# Patient Record
Sex: Female | Born: 1935 | Race: White | Hispanic: No | Marital: Single | Smoking: Never smoker
Health system: Southern US, Community
[De-identification: ages and names within clinical notes are randomized; demographics above are authoritative.]

## PROBLEM LIST (undated history)

## (undated) DIAGNOSIS — N289 Disorder of kidney and ureter, unspecified: Secondary | ICD-10-CM

## (undated) DIAGNOSIS — I10 Essential (primary) hypertension: Secondary | ICD-10-CM

## (undated) DIAGNOSIS — D696 Thrombocytopenia, unspecified: Secondary | ICD-10-CM

## (undated) DIAGNOSIS — K219 Gastro-esophageal reflux disease without esophagitis: Secondary | ICD-10-CM

## (undated) DIAGNOSIS — I4891 Unspecified atrial fibrillation: Secondary | ICD-10-CM

## (undated) DIAGNOSIS — I639 Cerebral infarction, unspecified: Secondary | ICD-10-CM

## (undated) HISTORY — PX: JOINT REPLACEMENT: SHX530

## (undated) HISTORY — PX: ABDOMINAL HYSTERECTOMY: SHX81

## (undated) HISTORY — DX: Unspecified atrial fibrillation: I48.91

---

## 2004-09-07 ENCOUNTER — Ambulatory Visit (HOSPITAL_COMMUNITY): Admission: RE | Admit: 2004-09-07 | Discharge: 2004-09-07 | Payer: Self-pay | Admitting: Family Medicine

## 2005-09-18 ENCOUNTER — Ambulatory Visit (HOSPITAL_COMMUNITY): Admission: RE | Admit: 2005-09-18 | Discharge: 2005-09-18 | Payer: Self-pay | Admitting: Family Medicine

## 2006-10-08 ENCOUNTER — Encounter: Admission: RE | Admit: 2006-10-08 | Discharge: 2006-10-08 | Payer: Self-pay | Admitting: Family Medicine

## 2007-06-09 ENCOUNTER — Ambulatory Visit: Payer: Self-pay | Admitting: Cardiology

## 2007-10-28 ENCOUNTER — Ambulatory Visit (HOSPITAL_COMMUNITY): Admission: RE | Admit: 2007-10-28 | Discharge: 2007-10-28 | Payer: Self-pay | Admitting: Family Medicine

## 2008-11-23 ENCOUNTER — Ambulatory Visit (HOSPITAL_COMMUNITY): Admission: RE | Admit: 2008-11-23 | Discharge: 2008-11-23 | Payer: Self-pay | Admitting: Family Medicine

## 2010-04-03 ENCOUNTER — Ambulatory Visit (HOSPITAL_COMMUNITY): Admission: RE | Admit: 2010-04-03 | Discharge: 2010-04-03 | Payer: Self-pay | Admitting: Family Medicine

## 2011-05-14 ENCOUNTER — Other Ambulatory Visit (HOSPITAL_COMMUNITY): Payer: Self-pay | Admitting: Family Medicine

## 2011-05-14 DIAGNOSIS — Z139 Encounter for screening, unspecified: Secondary | ICD-10-CM

## 2011-05-17 ENCOUNTER — Ambulatory Visit (HOSPITAL_COMMUNITY)
Admission: RE | Admit: 2011-05-17 | Discharge: 2011-05-17 | Disposition: A | Discharge status: Home / Self Care | Payer: Medicare Other | Source: Ambulatory Visit | Attending: Family Medicine | Admitting: Family Medicine

## 2011-05-17 DIAGNOSIS — Z1231 Encounter for screening mammogram for malignant neoplasm of breast: Secondary | ICD-10-CM | POA: Insufficient documentation

## 2011-05-17 DIAGNOSIS — Z139 Encounter for screening, unspecified: Secondary | ICD-10-CM

## 2012-08-28 ENCOUNTER — Other Ambulatory Visit (HOSPITAL_COMMUNITY): Payer: Self-pay | Admitting: Family Medicine

## 2012-08-28 DIAGNOSIS — Z139 Encounter for screening, unspecified: Secondary | ICD-10-CM

## 2012-09-02 ENCOUNTER — Ambulatory Visit (HOSPITAL_COMMUNITY)
Admission: RE | Admit: 2012-09-02 | Discharge: 2012-09-02 | Disposition: A | Discharge status: Home / Self Care | Payer: Medicare Other | Source: Ambulatory Visit | Attending: Family Medicine | Admitting: Family Medicine

## 2012-09-02 DIAGNOSIS — Z1231 Encounter for screening mammogram for malignant neoplasm of breast: Secondary | ICD-10-CM | POA: Insufficient documentation

## 2012-09-02 DIAGNOSIS — Z139 Encounter for screening, unspecified: Secondary | ICD-10-CM

## 2013-04-20 ENCOUNTER — Inpatient Hospital Stay (HOSPITAL_COMMUNITY): Payer: Medicare Other

## 2013-04-20 ENCOUNTER — Encounter (HOSPITAL_COMMUNITY): Payer: Self-pay

## 2013-04-20 ENCOUNTER — Inpatient Hospital Stay (HOSPITAL_COMMUNITY)
Admission: EM | Admit: 2013-04-20 | Discharge: 2013-04-22 | DRG: 064 | Disposition: A | Discharge status: Skilled Nursing Facility | Payer: Medicare Other | Attending: Internal Medicine | Admitting: Internal Medicine

## 2013-04-20 ENCOUNTER — Emergency Department (HOSPITAL_COMMUNITY): Payer: Medicare Other

## 2013-04-20 ENCOUNTER — Observation Stay (HOSPITAL_COMMUNITY): Payer: Medicare Other

## 2013-04-20 DIAGNOSIS — E871 Hypo-osmolality and hyponatremia: Secondary | ICD-10-CM | POA: Diagnosis present

## 2013-04-20 DIAGNOSIS — Z66 Do not resuscitate: Secondary | ICD-10-CM | POA: Diagnosis present

## 2013-04-20 DIAGNOSIS — I639 Cerebral infarction, unspecified: Secondary | ICD-10-CM

## 2013-04-20 DIAGNOSIS — R7309 Other abnormal glucose: Secondary | ICD-10-CM | POA: Diagnosis present

## 2013-04-20 DIAGNOSIS — I480 Paroxysmal atrial fibrillation: Secondary | ICD-10-CM

## 2013-04-20 DIAGNOSIS — R4701 Aphasia: Secondary | ICD-10-CM | POA: Diagnosis present

## 2013-04-20 DIAGNOSIS — Z79899 Other long term (current) drug therapy: Secondary | ICD-10-CM

## 2013-04-20 DIAGNOSIS — J9600 Acute respiratory failure, unspecified whether with hypoxia or hypercapnia: Secondary | ICD-10-CM | POA: Diagnosis not present

## 2013-04-20 DIAGNOSIS — Z7901 Long term (current) use of anticoagulants: Secondary | ICD-10-CM

## 2013-04-20 DIAGNOSIS — J9601 Acute respiratory failure with hypoxia: Secondary | ICD-10-CM | POA: Diagnosis present

## 2013-04-20 DIAGNOSIS — I10 Essential (primary) hypertension: Secondary | ICD-10-CM | POA: Diagnosis present

## 2013-04-20 DIAGNOSIS — I6340 Cerebral infarction due to embolism of unspecified cerebral artery: Principal | ICD-10-CM | POA: Diagnosis present

## 2013-04-20 DIAGNOSIS — G9349 Other encephalopathy: Secondary | ICD-10-CM | POA: Diagnosis present

## 2013-04-20 DIAGNOSIS — I4819 Other persistent atrial fibrillation: Secondary | ICD-10-CM | POA: Diagnosis not present

## 2013-04-20 DIAGNOSIS — R739 Hyperglycemia, unspecified: Secondary | ICD-10-CM | POA: Diagnosis present

## 2013-04-20 DIAGNOSIS — Z602 Problems related to living alone: Secondary | ICD-10-CM

## 2013-04-20 DIAGNOSIS — I4891 Unspecified atrial fibrillation: Secondary | ICD-10-CM

## 2013-04-20 DIAGNOSIS — K219 Gastro-esophageal reflux disease without esophagitis: Secondary | ICD-10-CM | POA: Diagnosis present

## 2013-04-20 DIAGNOSIS — R0902 Hypoxemia: Secondary | ICD-10-CM | POA: Diagnosis present

## 2013-04-20 DIAGNOSIS — R4781 Slurred speech: Secondary | ICD-10-CM | POA: Diagnosis present

## 2013-04-20 HISTORY — DX: Gastro-esophageal reflux disease without esophagitis: K21.9

## 2013-04-20 HISTORY — DX: Essential (primary) hypertension: I10

## 2013-04-20 LAB — GLUCOSE, CAPILLARY
Glucose-Capillary: 105 mg/dL — ABNORMAL HIGH (ref 70–99)
Glucose-Capillary: 145 mg/dL — ABNORMAL HIGH (ref 70–99)

## 2013-04-20 LAB — CBC
HCT: 38.7 % (ref 36.0–46.0)
Hemoglobin: 13.3 g/dL (ref 12.0–15.0)
MCH: 31 pg (ref 26.0–34.0)
MCHC: 34.4 g/dL (ref 30.0–36.0)
MCV: 90.2 fL (ref 78.0–100.0)
Platelets: 211 10*3/uL (ref 150–400)
RBC: 4.29 MIL/uL (ref 3.87–5.11)
RDW: 13.4 % (ref 11.5–15.5)
WBC: 9.1 10*3/uL (ref 4.0–10.5)

## 2013-04-20 LAB — BASIC METABOLIC PANEL
BUN: 19 mg/dL (ref 6–23)
CO2: 28 mEq/L (ref 19–32)
Calcium: 9.9 mg/dL (ref 8.4–10.5)
Chloride: 94 mEq/L — ABNORMAL LOW (ref 96–112)
Creatinine, Ser: 0.71 mg/dL (ref 0.50–1.10)
GFR calc Af Amer: 86 mL/min — ABNORMAL LOW (ref >90)
GFR calc non Af Amer: 74 mL/min — ABNORMAL LOW (ref >90)
Glucose, Bld: 143 mg/dL — ABNORMAL HIGH (ref 70–99)
Potassium: 4.1 mEq/L (ref 3.5–5.1)
Sodium: 131 mEq/L — ABNORMAL LOW (ref 135–145)

## 2013-04-20 LAB — RAPID URINE DRUG SCREEN, HOSP PERFORMED
Opiates: NOT DETECTED
Tetrahydrocannabinol: NOT DETECTED

## 2013-04-20 LAB — LIPID PANEL
HDL: 110 mg/dL (ref >39)
Total CHOL/HDL Ratio: 2.1 RATIO
VLDL: 8 mg/dL (ref 0–40)

## 2013-04-20 LAB — PROTIME-INR
INR: 0.89 (ref 0.00–1.49)
Prothrombin Time: 12 seconds (ref 11.6–15.2)

## 2013-04-20 LAB — HEMOGLOBIN A1C: Mean Plasma Glucose: 105 mg/dL (ref <117)

## 2013-04-20 LAB — APTT: aPTT: 30 seconds (ref 24–37)

## 2013-04-20 LAB — TROPONIN I: Troponin I: <0.3 ng/mL (ref <0.30)

## 2013-04-20 MED ORDER — CLOPIDOGREL BISULFATE 75 MG PO TABS
75.0000 mg | ORAL_TABLET | Freq: Every day | ORAL | Status: DC
  Administered 2013-04-20 – 2013-04-21 (×2): 75 mg via ORAL
  Filled 2013-04-20 (×2): qty 1

## 2013-04-20 MED ORDER — ONDANSETRON HCL 4 MG/2ML IJ SOLN: 4.0000 mg | Freq: Three times a day (TID) | INTRAMUSCULAR | Status: AC | PRN

## 2013-04-20 MED ORDER — PANTOPRAZOLE SODIUM 40 MG PO TBEC
40.0000 mg | DELAYED_RELEASE_TABLET | Freq: Every day | ORAL | Status: DC
  Administered 2013-04-20 – 2013-04-22 (×3): 40 mg via ORAL
  Filled 2013-04-20 (×3): qty 1

## 2013-04-20 MED ORDER — LORATADINE 10 MG PO TABS
10.0000 mg | ORAL_TABLET | Freq: Every day | ORAL | Status: DC
  Administered 2013-04-20 – 2013-04-22 (×3): 10 mg via ORAL
  Filled 2013-04-20 (×3): qty 1

## 2013-04-20 MED ORDER — ACETAMINOPHEN 500 MG PO TABS
500.0000 mg | ORAL_TABLET | Freq: Four times a day (QID) | ORAL | Status: DC | PRN
  Administered 2013-04-20: 500 mg via ORAL
  Filled 2013-04-20 (×2): qty 1

## 2013-04-20 MED ORDER — SIMVASTATIN 20 MG PO TABS
20.0000 mg | ORAL_TABLET | Freq: Every day | ORAL | Status: DC
  Administered 2013-04-20 – 2013-04-21 (×2): 20 mg via ORAL
  Filled 2013-04-20 (×4): qty 1

## 2013-04-20 MED ORDER — METOPROLOL TARTRATE 50 MG PO TABS
50.0000 mg | ORAL_TABLET | Freq: Two times a day (BID) | ORAL | Status: DC
  Administered 2013-04-20 – 2013-04-22 (×5): 50 mg via ORAL
  Filled 2013-04-20 (×6): qty 1

## 2013-04-20 MED ORDER — ENOXAPARIN SODIUM 30 MG/0.3ML ~~LOC~~ SOLN
30.0000 mg | SUBCUTANEOUS | Status: DC
  Administered 2013-04-20 – 2013-04-21 (×2): 30 mg via SUBCUTANEOUS
  Filled 2013-04-20 (×2): qty 0.3

## 2013-04-20 MED ORDER — SODIUM CHLORIDE 0.9 % IV SOLN
INTRAVENOUS | Status: DC
  Administered 2013-04-20: 02:00:00 via INTRAVENOUS

## 2013-04-20 MED ORDER — ADULT MULTIVITAMIN W/MINERALS CH
1.0000 | ORAL_TABLET | Freq: Every day | ORAL | Status: DC
  Administered 2013-04-20 – 2013-04-22 (×3): 1 via ORAL
  Filled 2013-04-20 (×3): qty 1

## 2013-04-20 MED ORDER — SODIUM CHLORIDE 0.9 % IV SOLN
INTRAVENOUS | Status: DC
  Administered 2013-04-20 (×2): via INTRAVENOUS

## 2013-04-20 NOTE — Evaluation (Signed)
I have read and agree with the below assessment and plan.   Swayzie Choate Helen Whitlow PT, DPT Pager: [phone] 

## 2013-04-20 NOTE — Consult Note (Signed)
Admission H&P    Chief Complaint: New-onset speech difficulty and confusion.  HPI: Theresa Kennedy is an 79 y.o. female with a history of hypertension who was found to be confused and having speech difficulty at 2100 on 04/19/2013. It is unknown at this point when the patient was last seen well. There is no documented history of previous stroke. Patient has been taking aspirin 81 mg daily. CT scan of her head showed no acute intracranial abnormality. Patient was beyond time window for treatment consideration with TPA, even considering when she was found by neighbors at 2100.   LSN: Unknown tPA Given: No: Beyond time under for treatment consideration mRankin:  No past medical history on file.  No past surgical history on file.  No family history on file. Social History:  has no tobacco, alcohol, and drug history on file.  Allergies: Allergies not on file   (Not in a hospital admission)  ROS: Unavailable.  Physical Examination: Blood pressure 141/60, pulse 67, temperature 97.7 F (36.5 C), resp. rate 14, SpO2 96.00%.  Neurologic Examination: Mental Status: Alert, oriented to age and month.  No difficulty with naming simple objects, intermittent word salad type expressions with more complex output. She also tended to perseverate with verbal responses Able to follow commands. Cranial Nerves: II-Visual fields were normal. III/IV/VI-Pupils were equal and reacted. Extraocular movements were full and conjugate.    V/VII-no facial weakness. VIII-normal. X-no dysarthria. Motor: 5/5 bilaterally with normal tone and bulk Sensory: Normal throughout. Deep Tendon Reflexes: 1+ and symmetric. Plantars: Mute on the right and flexor on the left. Cerebellar: Normal finger-to-nose testing. Carotid auscultation: Normal  Results for orders placed during the hospital encounter of 04/20/13 (from the past 48 hour(s))  CBC     Status: None   Collection Time    04/20/13 12:19 AM      Result Value  Range   WBC 9.1  4.0 - 10.5 K/uL   RBC 4.29  3.87 - 5.11 MIL/uL   Hemoglobin 13.3  12.0 - 15.0 g/dL   HCT 60.4  54.0 - 98.1 %   MCV 90.2  78.0 - 100.0 fL   MCH 31.0  26.0 - 34.0 pg   MCHC 34.4  30.0 - 36.0 g/dL   RDW 19.1  47.8 - 29.5 %   Platelets 211  150 - 400 K/uL  BASIC METABOLIC PANEL     Status: Abnormal   Collection Time    04/20/13 12:19 AM      Result Value Range   Sodium 131 (*) 135 - 145 mEq/L   Potassium 4.1  3.5 - 5.1 mEq/L   Chloride 94 (*) 96 - 112 mEq/L   CO2 28  19 - 32 mEq/L   Glucose, Bld 143 (*) 70 - 99 mg/dL   BUN 19  6 - 23 mg/dL   Creatinine, Ser 6.21  0.50 - 1.10 mg/dL   Calcium 9.9  8.4 - 30.8 mg/dL   GFR calc non Af Amer 74 (*) >90 mL/min   GFR calc Af Amer 86 (*) >90 mL/min   Comment:            The eGFR has been calculated     using the CKD EPI equation.     This calculation has not been     validated in all clinical     situations.     eGFR's persistently     <90 mL/min signify     possible Chronic Kidney Disease.  PROTIME-INR  Status: None   Collection Time    04/20/13 12:19 AM      Result Value Range   Prothrombin Time 12.0  11.6 - 15.2 seconds   INR 0.89  0.00 - 1.49  TROPONIN I     Status: None   Collection Time    04/20/13 12:19 AM      Result Value Range   Troponin I <0.30  <0.30 ng/mL   Comment:            Due to the release kinetics of cTnI,     a negative result within the first hours     of the onset of symptoms does not rule out     myocardial infarction with certainty.     If myocardial infarction is still suspected,     repeat the test at appropriate intervals.  APTT     Status: None   Collection Time    04/20/13 12:19 AM      Result Value Range   aPTT 30  24 - 37 seconds  GLUCOSE, CAPILLARY     Status: Abnormal   Collection Time    04/20/13 12:34 AM      Result Value Range   Glucose-Capillary 145 (*) 70 - 99 mg/dL   Ct Head Wo Contrast  04/20/2013   *RADIOLOGY REPORT*  Clinical Data: Code stroke.  CT HEAD  WITHOUT CONTRAST  Technique:  Contiguous axial images were obtained from the base of the skull through the vertex without contrast.  Comparison: None.  Findings: There is no evidence of acute infarction, mass lesion, or intra- or extra-axial hemorrhage on CT.  Prominence of the ventricles and sulci reflects mild cortical volume loss.  Minimal periventricular white matter change likely reflects small vessel ischemic microangiopathy.  The brainstem and fourth ventricle are within normal limits.  The basal ganglia are unremarkable in appearance.  The cerebral hemispheres demonstrate grossly normal gray-white differentiation. No mass effect or midline shift is seen.  There is no evidence of fracture; visualized osseous structures are unremarkable in appearance.  The orbits are within normal limits. The paranasal sinuses and mastoid air cells are well-aerated.  No significant soft tissue abnormalities are seen.  IMPRESSION:  1.  No acute intracranial pathology seen on CT. 2.  Mild cortical volume loss and minimal small vessel ischemic microangiopathy.  These results were called by telephone on 04/20/2013 at 12:18 a.m. to Dr. Marisa Severin, who verbally acknowledged these results.   Original Report Authenticated By: Tonia Ghent, M.D.    Assessment: 79 y.o. female of hypertension presenting with probable acute left MCA territory cerebral infarction with expressive aphasia.  Stroke Risk Factors - hypertension  Plan: 1. HgbA1c, fasting lipid panel 2. MRI, MRA  of the brain without contrast 3. PT consult, OT consult, Speech consult 4. Echocardiogram 5. Carotid dopplers 6. Prophylactic therapy-Antiplatelet med: Plavix  7. Risk factor modification 8. Telemetry monitoring   C.R. Roseanne Reno, MD [facility] [phone] 04/20/2013, 1:31 AM

## 2013-04-20 NOTE — Progress Notes (Signed)
[facility] PROGRESS NOTE  Theresa Kennedy MVH:[id number] DOB: [birth year] DOA: 04/20/2013 PCP: [name], MD  Assessment/Plan: 1. Aphasia: probably left brain stroke. MRI, MRA pending. ECHO, Doppler pending. LDL 111. PT, OT, Speech therapy consulted. EEG ordered. Continue with plavix.  2. Mild Hyponatremia: Continue with IV fluids.  3. Hypertension: Continue with Metoprolol. Hold Norvasc to avoid hypotension.   Code Status: DNR Family Communication: Nephew at bedside.  Disposition Plan: Likely SNF, patient lives by herself. PT, OT , SW consulted.    Consultants:  Neurology  Procedures:       ECHO pending.   Antibiotics:  none  HPI/Subjective: Patient still with aphasia, but able to answer some questions.   Objective: Filed Vitals:   04/20/13 0115 04/20/13 0200 04/20/13 0300 04/20/13 0600  BP: 148/59 173/79 141/49 145/53  Pulse: 76 84 79 77  Temp:   97.8 F (36.6 C) 98 F (36.7 C)  TempSrc:   Oral Oral  Resp: 18 15 16 18   Height:  5\' 1"  (1.549 m)    Weight:  54.885 kg (121 lb)    SpO2: 98% 98% 98% 98%    Intake/Output Summary (Last 24 hours) at 04/20/13 1003 Last data filed at 04/20/13 0819  Gross per 24 hour  Intake    240 ml  Output      0 ml  Net    240 ml   Filed Weights   04/20/13 0200  Weight: 54.885 kg (121 lb)    Exam:   General:  No distress. Aphasia.   Cardiovascular: S 1, S 2 RRR  Respiratory: CTA  Abdomen: Bs present, soft, NT, ND.   Musculoskeletal: no edema.   Data Reviewed: Basic Metabolic Panel:  Recent Labs Lab 04/20/13 0019  NA 131*  K 4.1  CL 94*  CO2 28  GLUCOSE 143*  BUN 19  CREATININE 0.71  CALCIUM 9.9   Liver Function Tests: No results found for this basename: AST, ALT, ALKPHOS, BILITOT, PROT, ALBUMIN,  in the last 168 hours No results found for this basename: LIPASE, AMYLASE,  in the last 168 hours No results found for this basename: AMMONIA,  in the last 168 hours CBC:  Recent Labs Lab  04/20/13 0019  WBC 9.1  HGB 13.3  HCT 38.7  MCV 90.2  PLT 211   Cardiac Enzymes:  Recent Labs Lab 04/20/13 0019  TROPONINI <0.30   BNP (last 3 results) No results found for this basename: PROBNP,  in the last 8760 hours CBG:  Recent Labs Lab 04/20/13 0034 04/20/13 0635  GLUCAP 145* 117*    No results found for this or any previous visit (from the past 240 hour(s)).   Studies: Ct Head Wo Contrast  04/20/2013   *RADIOLOGY REPORT*  Clinical Data: Code stroke.  CT HEAD WITHOUT CONTRAST  Technique:  Contiguous axial images were obtained from the base of the skull through the vertex without contrast.  Comparison: None.  Findings: There is no evidence of acute infarction, mass lesion, or intra- or extra-axial hemorrhage on CT.  Prominence of the ventricles and sulci reflects mild cortical volume loss.  Minimal periventricular white matter change likely reflects small vessel ischemic microangiopathy.  The brainstem and fourth ventricle are within normal limits.  The basal ganglia are unremarkable in appearance.  The cerebral hemispheres demonstrate grossly normal gray-white differentiation. No mass effect or midline shift is seen.  There is no evidence of fracture; visualized osseous structures are unremarkable in appearance.  The orbits are within normal  limits. The paranasal sinuses and mastoid air cells are well-aerated.  No significant soft tissue abnormalities are seen.  IMPRESSION:  1.  No acute intracranial pathology seen on CT. 2.  Mild cortical volume loss and minimal small vessel ischemic microangiopathy.  These results were called by telephone on 04/20/2013 at 12:18 a.m. to Dr. Marisa Severin, who verbally acknowledged these results.   Original Report Authenticated By: Tonia Ghent, M.D.    Scheduled Meds: . clopidogrel  75 mg Oral Daily  . enoxaparin (LOVENOX) injection  30 mg Subcutaneous Q24H  . loratadine  10 mg Oral Daily  . metoprolol  50 mg Oral BID  . multivitamin with  minerals  1 tablet Oral Daily  . pantoprazole  40 mg Oral Daily   Continuous Infusions: . sodium chloride 75 mL/hr at 04/20/13 0245    Active Problems:   Aphasia   Hyperglycemia   HTN (hypertension)    Time spent: 25 minutes.     Ryin Schillo  [facility] Pager [phone]. If 7PM-7AM, please contact night-coverage at [email or web address], password [facility] 04/20/2013, 10:03 AM  LOS: 0 days

## 2013-04-20 NOTE — Progress Notes (Signed)
Stroke Team Progress Note  HISTORY Theresa Kennedy is an 79 y.o. female with a history of hypertension who was found to be confused and having speech difficulty at 2100 on 04/19/2013. It is unknown at this point when the patient was last seen well. There is no documented history of previous stroke. Patient has been taking aspirin 81 mg daily. CT scan of her head showed no acute intracranial abnormality. Patient was beyond time window for treatment consideration with TPA, even considering when she was found by neighbors at 2100.  Marland Kitchen She was admitted for further evaluation and treatment.  SUBJECTIVE Her friend is at the bedside.  Overall she feels her condition is stable. She is reading the menu though still with expressing difficulty. She lives alone, is independent. She has no children.  OBJECTIVE Most recent Vital Signs: Filed Vitals:   04/20/13 0115 04/20/13 0200 04/20/13 0300 04/20/13 0600  BP: 148/59 173/79 141/49 145/53  Pulse: 76 84 79 77  Temp:   97.8 F (36.6 C) 98 F (36.7 C)  TempSrc:   Oral Oral  Resp: 18 15 16 18   Height:  5\' 1"  (1.549 m)    Weight:  54.885 kg (121 lb)    SpO2: 98% 98% 98% 98%   CBG (last 3)   Recent Labs  04/20/13 0034 04/20/13 0635  GLUCAP 145* 117*    IV Fluid Intake:   . sodium chloride 75 mL/hr at 04/20/13 0245    MEDICATIONS  . clopidogrel  75 mg Oral Daily  . enoxaparin (LOVENOX) injection  30 mg Subcutaneous Q24H  . loratadine  10 mg Oral Daily  . metoprolol  50 mg Oral BID  . multivitamin with minerals  1 tablet Oral Daily  . pantoprazole  40 mg Oral Daily   PRN:  ondansetron (ZOFRAN) IV  Diet:  Cardiac thin liquids Activity:  As tolerated, Bathroom privileges with assistance DVT Prophylaxis:  Lovenox 30 mg sq daily   CLINICALLY SIGNIFICANT STUDIES Basic Metabolic Panel:  Recent Labs Lab 04/20/13 0019  NA 131*  K 4.1  CL 94*  CO2 28  GLUCOSE 143*  BUN 19  CREATININE 0.71  CALCIUM 9.9   Liver Function Tests: No results  found for this basename: AST, ALT, ALKPHOS, BILITOT, PROT, ALBUMIN,  in the last 168 hours CBC:  Recent Labs Lab 04/20/13 0019  WBC 9.1  HGB 13.3  HCT 38.7  MCV 90.2  PLT 211   Coagulation:  Recent Labs Lab 04/20/13 0019  LABPROT 12.0  INR 0.89   Cardiac Enzymes:  Recent Labs Lab 04/20/13 0019  TROPONINI <0.30   Urinalysis: No results found for this basename: COLORURINE, APPERANCEUR, LABSPEC, PHURINE, GLUCOSEU, HGBUR, BILIRUBINUR, KETONESUR, PROTEINUR, UROBILINOGEN, NITRITE, LEUKOCYTESUR,  in the last 168 hours Lipid Panel    Component Value Date/Time   CHOL 229* 04/20/2013 0420   TRIG 40 04/20/2013 0420   HDL 110 04/20/2013 0420   CHOLHDL 2.1 04/20/2013 0420   VLDL 8 04/20/2013 0420   LDLCALC 111* 04/20/2013 0420   HgbA1C  No results found for this basename: HGBA1C    Urine Drug Screen:     Component Value Date/Time   LABOPIA NONE DETECTED 04/20/2013 0410   COCAINSCRNUR NONE DETECTED 04/20/2013 0410   LABBENZ NONE DETECTED 04/20/2013 0410   AMPHETMU NONE DETECTED 04/20/2013 0410   THCU NONE DETECTED 04/20/2013 0410   LABBARB NONE DETECTED 04/20/2013 0410    Alcohol Level: No results found for this basename: ETH,  in the last 168 hours  CT of the brain  04/20/2013   1.  No acute intracranial pathology seen on CT. 2.  Mild cortical volume loss and minimal small vessel ischemic microangiopathy.    MRI of the brain    MRA of the brain    2D Echocardiogram    Carotid Doppler    CXR    EKG  normal sinus rhythm.   Therapy Recommendations   Physical Exam   Frail elderly Caucasian lady currently not in distress.Awake alert. Afebrile. Head is nontraumatic. Neck is supple without bruit. Hearing is normal. Cardiac exam no murmur or gallop. Lungs are clear to auscultation. Distal pulses are well felt. Neurological Exam :  Awake alert oriented x2. Diminished attention, registration and recall. Speech is fluent with only occasional word finding difficulties and slight hesitancy. Able to  name, repeat and comprehend quite well. Extraocular moments are full range without nystagmus. Fundi were not visualized. She blinks to threat bilaterally. Face is symmetric without weakness. Tongue is midline. Motor system exam reveals symmetric upper and lower eczema the strength without drift or focal weakness. Both plantars are downgoing. Sensation appears preserved. Gait was not tested. ASSESSMENT Theresa Kennedy is a 79 y.o. female presenting with expressive aphasia that has improved since admission.  Suspect left brain stroke/TIA. Imaging pending. Infarct felt to be due to unknown source. On aspirin 81 mg orally every day prior to admission. Now on clopidogrel 75 mg orally every day for secondary stroke prevention. Patient with resultant mild expressive aphasia. Work up underway.  Hypertension GERD  Hospital day # 0  TREATMENT/PLAN  Continue clopidogrel 75 mg orally every day for secondary stroke prevention.  Complete stroke workup - f/u MRI, MRA, 2D, carotids, labs  OOB. Therapy evals.  Check EEG (onset unwitnessed, patient cannot remember onset, good memory at baseline)  Annie Main, MSN, RN, ANVP-BC, ANP-BC, GNP-BC [facility] Pager: [phone] 04/20/2013 10:29 AM  I have personally obtained a history, examined the patient, evaluated imaging results, and formulated the assessment and plan of care. I agree with the above. Delia Heady, MD

## 2013-04-20 NOTE — Progress Notes (Signed)
EEG Completed; Results Pending  

## 2013-04-20 NOTE — Evaluation (Signed)
Physical Therapy Evaluation Patient Details Name: Theresa Kennedy MRN: [id number] DOB: [birth year] Today's Date: 04/20/2013 Time: 1420-1450 PT Time Calculation (min): 30 min  PT Assessment / Plan / Recommendation Clinical Impression  79 y/o female admitted for confusion and speech difficulty. MRI showing acute infarcts in right frontal lobe and right occipital lobe. Pt presents to PT with non fluent aphasia and diminished balance. Pt able to perform basic transfers with min guard and ambulation with min guard/min assist. Acute PT to maximize functional indpendence and saftey for d/c to next venue. Recommending SNF at this time based on avaliable support at d/c.    PT Assessment  Patient needs continued PT services    Follow Up Recommendations  SNF;Supervision/Assistance - 24 hour       Barriers to Discharge Decreased caregiver support            Frequency Min 4X/week    Precautions / Restrictions Precautions Precautions: Fall Restrictions Weight Bearing Restrictions: No   Pertinent Vitals/Pain Pt denies pain      Mobility  Transfers Transfers: Sit to Stand;Stand to Sit Sit to Stand: From chair/3-in-1;4: Min guard Stand to Sit: To chair/3-in-1;4: Min guard Details for Transfer Assistance: pt with increased postural sway during standing. No LOB Ambulation/Gait Ambulation/Gait Assistance: 4: Min guard;4: Min assist Ambulation Distance (Feet): 200 Feet Assistive device: None Ambulation/Gait Assistance Details: Pt requiring min assist to maintain balance during ambulaion when performing head turns. Gait Pattern: Step-through pattern;Decreased stride length;Trunk flexed General Gait Details: shoulders shifted left throughout gait. H/o scoliosis Stairs: No Wheelchair Mobility Wheelchair Mobility: No Modified Rankin (Stroke Patients Only) Pre-Morbid Rankin Score: No symptoms Modified Rankin: Moderately severe disability        PT Diagnosis: Difficulty walking;Abnormality of  gait  PT Problem List: Decreased activity tolerance;Decreased balance;Decreased mobility;Decreased safety awareness;Decreased knowledge of use of DME PT Treatment Interventions: DME instruction;Gait training;Stair training;Functional mobility training;Therapeutic activities;Therapeutic exercise;Balance training;Neuromuscular re-education;Patient/family education   PT Goals Acute Rehab PT Goals PT Goal Formulation: With patient Time For Goal Achievement: 04/27/13 Potential to Achieve Goals: Good Pt will go Sit to Stand: with modified independence PT Goal: Sit to Stand - Progress: Goal set today Pt will go Stand to Sit: with modified independence PT Goal: Stand to Sit - Progress: Goal set today Pt will Ambulate: >150 feet;with modified independence PT Goal: Ambulate - Progress: Goal set today Pt will Go Up / Down Stairs: 1-2 stairs;with modified independence PT Goal: Up/Down Stairs - Progress: Goal set today  Visit Information  Last PT Received On: 04/20/13 Assistance Needed: +1     Subjective Data  Subjective: Pt with non fluent aphasia Patient Stated Goal: return home   Prior Functioning  Home Living Lives With: Alone Available Help at Discharge: Family Type of Home: House Home Access: Stairs to enter [employer] of Steps: 2 Entrance Stairs-Rails: Right;Left Home Layout: Two level;Able to live on main level with bedroom/bathroom Bathroom Shower/Tub: Engineer, manufacturing systems: Standard Home Adaptive Equipment: Raised toilet seat with rails Prior Function Level of Independence: Independent Able to Take Stairs?: Yes Driving: Yes Vocation: Retired Musician: Expressive difficulties    Copywriter, advertising Arousal/Alertness: Awake/alert Behavior During Therapy: WFL for tasks assessed/performed Overall Cognitive Status: Difficult to assess (seemed to be [facility] throughout treatment) Difficult to assess due to: Impaired communication (non  fluent aphasia)    Extremity/Trunk Assessment Right Upper Extremity Assessment RUE ROM/Strength/Tone: [facility] for tasks assessed Left Upper Extremity Assessment LUE ROM/Strength/Tone: [facility] for tasks assessed Right Lower Extremity Assessment RLE  ROM/Strength/Tone: [facility] for tasks assessed RLE Coordination: WFL - gross motor Left Lower Extremity Assessment LLE ROM/Strength/Tone: WFL for tasks assessed LLE Coordination: WFL - gross motor Trunk Assessment Trunk Assessment: Kyphotic   Balance Balance Balance Assessed: Yes Static Standing Balance Static Standing - Balance Support: No upper extremity supported Rhomberg - Eyes Opened: 30 High Level Balance High Level Balance Activites: Head turns High Level Balance Comments: pt requiring min assist to prevent LOB when performing head turns bilaterally  End of Session PT - End of Session Equipment Utilized During Treatment: Gait belt Activity Tolerance: Patient tolerated treatment well Patient left: in chair;with call bell/phone within reach;with bed alarm set;with family/visitor present Nurse Communication: Mobility status  GP     Payton Doughty 04/20/2013, 3:12 PM Payton Doughty, PT Student

## 2013-04-20 NOTE — Progress Notes (Signed)
PT Cancellation Note  Patient Details Name: Theresa Kennedy MRN: [id number] DOB: [birth year]   Cancelled Treatment:    Reason Eval/Treat Not Completed: Patient at procedure or test/unavailable. Patient at MRI. Will f/u as time and schedule allow.    [facility] HELEN 04/20/2013, 1:50 PM Pager: [phone]

## 2013-04-20 NOTE — Progress Notes (Signed)
History: 79 yo F with aphasia  Background: There is a posterior dominant rhythm seen intermittently achieving a frequency of 8 Hz. there is mild generalized irregular delta activity, and in addition there appears to be a focal delta maximal at F8, T8.   Photic stimulation: Physiologic driving is not performed  EEG Abnormalities: 1) focal irregular delta at F8, T8 2) generalized irregular delta  Clinical Interpretation: This abnormal EEG is consistent with an area of cerebral dysfunction in the right frontotemporal region superimposed on a mild nonspecific generalized cerebral dysfunction (encephalopathy). There was no seizure or seizure predisposition recorded on this study.   Ritta Slot, MD [facility] [phone]  If 7pm- 7am, please page neurology on call at [phone].

## 2013-04-20 NOTE — H&P (Addendum)
[facility] History and Physical  Reia Viernes XBJ:[id number] DOB: [birth year] DOA: 04/20/2013  Referring physician: EDP PCP: [name], MD    Chief Complaint: Speech disturbance  HPI: Theresa Kennedy is a 79 y.o. female with history of hypertension, GERD spent most of her day today with having her 75 year old sister who is admitted at [facility] today and then had supper at [facility] with her niece and was dropped off at home around 5 PM. Another niece called her around 8pm, she called multiple times finally around 10pm she picked up the phone and kept mumbling "end of late" and later kept saying " end linger". She was then brought to the ER as a code stroke, seen by neurology. Not felt to be a TPA candidate due to delay in presentation and low NIH score. Upon evaluation the emergency room noted to have expressive aphasia, CT head unremarkable   Review of Systems: The patient denies anorexia, fever, weight loss,, vision loss, decreased hearing, hoarseness, chest pain, syncope, dyspnea on exertion, peripheral edema, balance deficits, hemoptysis, abdominal pain, melena, hematochezia, severe indigestion/heartburn, hematuria, incontinence, genital sores, muscle weakness, suspicious skin lesions, transient blindness, difficulty walking, depression, unusual weight change, abnormal bleeding, enlarged lymph nodes, angioedema, and breast masses.    Past medical history Hypertension Allergies GERD  No past surgical history on file.  Social History:  has no tobacco, alcohol, and drug history on file. Lives at home by herself, independent in most ADLs, cognitively sharp very busy in the community  Allergies not on file  family history  Unable to assess due to speech disturbance  Prior to Admission medications   Medication Sig Start Date End Date Taking? Authorizing Provider  amLODipine (NORVASC) 10 MG tablet Take 10 mg by mouth daily.   Yes Historical Provider, MD   aspirin EC 81 MG tablet Take 81 mg by mouth daily.   Yes Historical Provider, MD  CALCIUM-VITAMIN D PO Take 1 tablet by mouth.   Yes Historical Provider, MD  Grape Seed Extract 100 MG CAPS Take by mouth.   Yes Historical Provider, MD  loratadine (CLARITIN) 10 MG tablet Take 10 mg by mouth.   Yes Historical Provider, MD  metoprolol (LOPRESSOR) 50 MG tablet Take 50 mg by mouth 2 (two) times daily.   Yes Historical Provider, MD  Multiple Vitamin (MULTIVITAMIN WITH MINERALS) TABS Take 1 tablet by mouth daily.   Yes Historical Provider, MD  omeprazole (PRILOSEC) 20 MG capsule Take 20 mg by mouth daily.   Yes Historical Provider, MD  potassium chloride (K-DUR) 10 MEQ tablet Take 10 mEq by mouth 2 (two) times daily.   Yes Historical Provider, MD   Physical Exam: Filed Vitals:   04/20/13 0021 04/20/13 0045  BP: 164/67 141/60  Pulse: 75 67  Temp: 97.7 F (36.5 C)   Resp: 18 14  SpO2: 98% 96%     General:  Is alert awake oriented x3, still with word finding difficulty in expressing herself appropriately  HEENT: PERRLA, EOMI  CVS S1-S2 regular rate rhythm no murmurs rubs or gallops  Lungs clear to auscultation bilaterally  Abdomen soft nontender with normal bowel sounds no organomegaly  Extremities no edema clubbing or cyanosis  Skin no rashes, warm  Psychiatric appropriate mood and affect  Neuro : Expressive Aphasia       cranial nerve 2-12 intact  Verified by 5 in all extremities  Sensory light touch intact  Coordination no dysdiadochokinesia  Reflexes 2+ bilaterally  Plantars withdrawal  Labs on Admission:  Basic Metabolic Panel:  Recent Labs Lab 04/20/13 0019  NA 131*  K 4.1  CL 94*  CO2 28  GLUCOSE 143*  BUN 19  CREATININE 0.71  CALCIUM 9.9   Liver Function Tests: No results found for this basename: AST, ALT, ALKPHOS, BILITOT, PROT, ALBUMIN,  in the last 168 hours No results found for this basename: LIPASE, AMYLASE,  in the last 168 hours No results  found for this basename: AMMONIA,  in the last 168 hours CBC:  Recent Labs Lab 04/20/13 0019  WBC 9.1  HGB 13.3  HCT 38.7  MCV 90.2  PLT 211   Cardiac Enzymes:  Recent Labs Lab 04/20/13 0019  TROPONINI <0.30    BNP (last 3 results) No results found for this basename: PROBNP,  in the last 8760 hours CBG:  Recent Labs Lab 04/20/13 0034  GLUCAP 145*    Radiological Exams on Admission: Ct Head Wo Contrast  04/20/2013   *RADIOLOGY REPORT*  Clinical Data: Code stroke.  CT HEAD WITHOUT CONTRAST  Technique:  Contiguous axial images were obtained from the base of the skull through the vertex without contrast.  Comparison: None.  Findings: There is no evidence of acute infarction, mass lesion, or intra- or extra-axial hemorrhage on CT.  Prominence of the ventricles and sulci reflects mild cortical volume loss.  Minimal periventricular white matter change likely reflects small vessel ischemic microangiopathy.  The brainstem and fourth ventricle are within normal limits.  The basal ganglia are unremarkable in appearance.  The cerebral hemispheres demonstrate grossly normal gray-white differentiation. No mass effect or midline shift is seen.  There is no evidence of fracture; visualized osseous structures are unremarkable in appearance.  The orbits are within normal limits. The paranasal sinuses and mastoid air cells are well-aerated.  No significant soft tissue abnormalities are seen.  IMPRESSION:  1.  No acute intracranial pathology seen on CT. 2.  Mild cortical volume loss and minimal small vessel ischemic microangiopathy.  These results were called by telephone on 04/20/2013 at 12:18 a.m. to Dr. Marisa Severin, who verbally acknowledged these results.   Original Report Authenticated By: Tonia Ghent, M.D.    EKG: Independently reviewed. No acute ST-T wave changes  Assessment/Plan  1. Expressive Aphasia -Admit to Tele -CHeck MRI/MRA -echo /carotid duplex -Lipids, Hbaic -PT/OT/ST  evals -Neuro consulted -start Plavix  2. HTN: -hold amlodipine -Resume metoprolol   3. Hyperglycemia -Check hemoglobin A1c  DVt proph: lovenox  Code Status: DO NOT RESUSCITATE  Family Communication: Discuss with patient and niece at bedside  Disposition Plan: Inpatient    Time spent:  [facility] Pager [phone] If 7PM-7AM, please contact night-coverage [email or web address] Password [facility] 04/20/2013, 1:43 AM

## 2013-04-20 NOTE — Progress Notes (Signed)
SLP Cancellation Note  Patient Details Name: Theresa Kennedy MRN: [id number] DOB: 1921-03-13   Cancelled treatment:       Reason Eval/Treat Not Completed: Patient at procedure or test/unavailable. Will return next date for speech evaluation.   Blenda Mounts Laurice 04/20/2013, 1:13 PM

## 2013-04-20 NOTE — ED Provider Notes (Signed)
History     CSN: [id number]  Arrival date & time 04/20/13  0002   First MD Initiated Contact with Patient 04/20/13 0018      Chief Complaint  Patient presents with  . Code Stroke   HPI Catalena Buford is a 79 y.o. female with a history of hypertension presents with expressive aphasia.  Patient was last seen normal in the afternoon. Patient typically calls her niece at 60 PM in the evening, however she failed to call this evening. Her niece called neighbors who came to check on her doctor acting abnormally with abnormal speech.  Patient's symptoms been severe, constant, resolving somewhat since the EMS arrived. Denies any chest pain, shortness of breath, abdominal pain, nausea vomiting, diarrhea, fevers or chills.  No past medical history on file.  No past surgical history on file.  No family history on file.  History  Substance Use Topics  . Smoking status: Not on file  . Smokeless tobacco: Not on file  . Alcohol Use: Not on file    OB History   No data available      Review of Systems At least 10pt or greater review of systems completed and are negative except where specified in the HPI.   Allergies  Review of patient's allergies indicates not on file.  Home Medications  No current outpatient prescriptions on file.  BP 164/67  Pulse 75  Temp(Src) 97.7 F (36.5 C)  Resp 18  SpO2 98%  Physical Exam  Nursing notes reviewed.  Electronic medical record reviewed. VITAL SIGNS:   Filed Vitals:   04/20/13 0021 04/20/13 0045 04/20/13 0115 04/20/13 0200  BP: 164/67 141/60 148/59 173/79  Pulse: 75 67 76 84  Temp: 97.7 F (36.5 C)     Resp: 18 14 18 15   Height:    5\' 1"  (1.549 m)  Weight:    121 lb (54.885 kg)  SpO2: 98% 96% 98% 98%   CONSTITUTIONAL: Awake, oriented, appears non-toxic HENT: Atraumatic, normocephalic, oral mucosa pink and moist, airway patent. Nares patent without drainage. External ears normal. EYES: Conjunctiva clear, EOMI, PERRLA NECK: Trachea  midline, non-tender, supple CARDIOVASCULAR: Normal heart rate, Normal rhythm, No murmurs, rubs, gallops PULMONARY/CHEST: Clear to auscultation, no rhonchi, wheezes, or rales. Symmetrical breath sounds. Non-tender. ABDOMINAL: Non-distended, soft, non-tender - no rebound or guarding.  BS normal. NEUROLOGIC: Cranial nerves are normal. Patient has expressive aphasia. Otherwise, non-focal, moving all four extremities, no gross sensory or motor deficits. EXTREMITIES: No clubbing, cyanosis, or edema SKIN: Warm, Dry, No erythema, No rash  ED Course  Procedures (including critical care time)  Date: 04/20/2013  Rate: 68  Rhythm: normal sinus rhythm  QRS Axis: Left  Intervals: normal  ST/T Wave abnormalities: normal  Conduction Disutrbances: First degree block  Narrative Interpretation: Left axis deviation first degree AV block possible anteroseptal infarct - age indeterminate comment no prior EKG for comparison   Labs Reviewed  GLUCOSE, CAPILLARY - Abnormal; Notable for the following:    Glucose-Capillary 145 (*)    All other components within normal limits  CBC  PROTIME-INR  APTT  BASIC METABOLIC PANEL  TROPONIN I   Ct Head Wo Contrast  04/20/2013   *RADIOLOGY REPORT*  Clinical Data: Code stroke.  CT HEAD WITHOUT CONTRAST  Technique:  Contiguous axial images were obtained from the base of the skull through the vertex without contrast.  Comparison: None.  Findings: There is no evidence of acute infarction, mass lesion, or intra- or extra-axial hemorrhage on CT.  Prominence  of the ventricles and sulci reflects mild cortical volume loss.  Minimal periventricular white matter change likely reflects small vessel ischemic microangiopathy.  The brainstem and fourth ventricle are within normal limits.  The basal ganglia are unremarkable in appearance.  The cerebral hemispheres demonstrate grossly normal gray-white differentiation. No mass effect or midline shift is seen.  There is no evidence of  fracture; visualized osseous structures are unremarkable in appearance.  The orbits are within normal limits. The paranasal sinuses and mastoid air cells are well-aerated.  No significant soft tissue abnormalities are seen.  IMPRESSION:  1.  No acute intracranial pathology seen on CT. 2.  Mild cortical volume loss and minimal small vessel ischemic microangiopathy.  These results were called by telephone on 04/20/2013 at 12:18 a.m. to Dr. Marisa Severin, who verbally acknowledged these results.   Original Report Authenticated By: Tonia Ghent, M.D.     1. Stroke   2. Expressive aphasia   3. HTN (hypertension)   4. Hyperglycemia       MDM  Patient has expressive aphasia, no other deficits noted. Patient's outside window for TPA, not a candidate for neural hospitalist who evaluated already. CT is unremarkable. Discussed with hospitalist for admission.        Jones Skene, MD 04/20/13 [phone]

## 2013-04-20 NOTE — Code Documentation (Signed)
79 yo wf brought in via [place] [facility] for speech difficulty & confusion.  Pt was with one of her nieces during the day at the hospital visiting the pt's sister.  Around 2100 the pt was visited by a neighbor & found to be confused.  Code stroke called 0006, pt arrival 0002, LKW unknown, EDP exam 0005, stroke team arrival 0010, neurologist arrival 0015, pt arrival in CT 0007, phlebotomist arrival 0006. NIH 1 for mild aphasia. No acute treatment due to outside window.

## 2013-04-20 NOTE — Progress Notes (Signed)
*  PRELIMINARY RESULTS* Echocardiogram 2D Echocardiogram has been performed.  Jeryl Columbia 04/20/2013, 12:13 PM

## 2013-04-20 NOTE — ED Notes (Signed)
Per EMS, pt was noted to be answering inappropriately with some aphasia by neighbors who went and checked in on her around 9pm.  Prior to contact with neighbors, pt was out with niece.  No LSN established as niece was not available at time of EMS arrival.  Pt moving all extremities with equal strength and prior to arrival pt started appropriately answering questions although there were moments when speech was incomprehensible.

## 2013-04-20 NOTE — ED Notes (Signed)
Pt placed on bed pan unable to void

## 2013-04-21 DIAGNOSIS — I4891 Unspecified atrial fibrillation: Secondary | ICD-10-CM

## 2013-04-21 DIAGNOSIS — I6340 Cerebral infarction due to embolism of unspecified cerebral artery: Principal | ICD-10-CM | POA: Diagnosis present

## 2013-04-21 DIAGNOSIS — I6350 Cerebral infarction due to unspecified occlusion or stenosis of unspecified cerebral artery: Secondary | ICD-10-CM

## 2013-04-21 DIAGNOSIS — J9601 Acute respiratory failure with hypoxia: Secondary | ICD-10-CM | POA: Diagnosis present

## 2013-04-21 DIAGNOSIS — R4781 Slurred speech: Secondary | ICD-10-CM | POA: Diagnosis present

## 2013-04-21 DIAGNOSIS — I4819 Other persistent atrial fibrillation: Secondary | ICD-10-CM | POA: Diagnosis not present

## 2013-04-21 LAB — GLUCOSE, CAPILLARY
Glucose-Capillary: 145 mg/dL — ABNORMAL HIGH (ref 70–99)
Glucose-Capillary: 111 mg/dL — ABNORMAL HIGH (ref 70–99)

## 2013-04-21 MED ORDER — RIVAROXABAN 20 MG PO TABS
20.0000 mg | ORAL_TABLET | Freq: Every day | ORAL | Status: DC
  Filled 2013-04-21: qty 1

## 2013-04-21 MED ORDER — RIVAROXABAN 15 MG PO TABS
15.0000 mg | ORAL_TABLET | Freq: Every day | ORAL | Status: DC
  Administered 2013-04-21: 15 mg via ORAL
  Filled 2013-04-21 (×2): qty 1

## 2013-04-21 NOTE — Progress Notes (Signed)
Clinical Social Work Department BRIEF PSYCHOSOCIAL ASSESSMENT 04/21/2013  Patient:  Theresa Kennedy,Theresa Kennedy     Account Number:  [id number]     Admit date:  04/20/2013  Clinical Social Worker:  Robin Searing  Date/Time:  04/21/2013 12:12 PM  Referred by:  Physician  Date Referred:  04/21/2013 Referred for  SNF Placement   Other Referral:   Interview type:  Patient Other interview type:   met with patient and her nephew at bedside    PSYCHOSOCIAL DATA Living Status:  ALONE Admitted from facility:   Level of care:   Primary support name:  nephew/POA Or[place]illa Fus [phone] Primary support relationship to patient:  FAMILY Degree of support available:   minimal    CURRENT CONCERNS Current Concerns  Post-Acute Placement   Other Concerns:    SOCIAL WORK ASSESSMENT / PLAN Patient admitted from home but will need SNF at d/c-  she is agreeable to this and would like to go to [facility] where her sister is as well- will pursue SNF offers and advise-   Assessment/plan status:  Other - See comment Other assessment/ plan:   FL2 and [place] to be completed   Information/referral to community resources:   SNF  EMS    PATIENT'S/FAMILY'S RESPONSE TO PLAN OF CARE: Patient appreciative of CSW visit and assistance-       Reece Levy, MSW, [employer] [phone]

## 2013-04-21 NOTE — Progress Notes (Signed)
I have read and agree with the below treatment note and plan. Brinlee Gambrell Helen Whitlow PT, DPT Pager: [phone] 

## 2013-04-21 NOTE — Progress Notes (Signed)
[facility] PROGRESS NOTE  Rodolfo Notaro KVQ:[id number] DOB: 10/04/1921 DOA: 04/20/2013 PCP: [name], MD  Interim History: Theresa Kennedy is an 79 y.o. very functional female patient with a history of hypertension who was found to be confused and having speech difficulty at 2100 on 04/19/2013. It is unknown at this point when the patient was last seen well. There is no documented history of previous stroke. Patient has been taking aspirin 81 mg daily. CT scan of her head showed no acute intracranial abnormality. Patient was beyond time window for treatment consideration with TPA, even considering when she was found by neighbors at 2100. She was admitted for further evaluation and treatment. MRI confirmed right frontal lobe and very small right occipital lobe infarct. Telemetry showed new onset A. fib on 6/2. Stroke workup initiated. Neurology and cardiology consulted. Patient was initially started on Plavix due to A. fib, switched to Xarelto  Assessment/Plan: 1. Acute Right Brain Infarcts/expressive aphasia: Patient left-handed. It's possible that her speech area is on right side. Infarcts felt to be embolic do to PAF. Stroke workup completed. Started on Xarelto on 6/3. start statins for LDL 111. patient will go to SNF for rehabilitation. Outpatient followup with Dr. Pearlean Brownie in 2 months. Neurology consulted. Hemoglobin A1c 5.3 2. Paroxysmal atrial fibrillation: CVR. Cardiology consultation appreciated. Continue metoprolol and started on Xarelto. Outpatient followup with Dr. Antoine Poche in [place]. 3. Mild Hyponatremia: ? SIADH. Appears euvolemic. DC IV fluids and follow BMP in a.m. 4. Hypertension: Continue with Metoprolol. Reasonably controlled. Consider resuming Norvasc at discharge.   Code Status: DNR Family Communication: Nephew at bedside.  Disposition Plan: Likely SNF when bed available   Consultants:  Neurology  Cardiology  Procedures:    EEG  Antibiotics:  None  HPI/Subjective: Speech has improved. Denies any other complaints.   Objective: Filed Vitals:   04/20/13 2200 04/21/13 0147 04/21/13 0600 04/21/13 1113  BP: 174/61 125/89 154/62 145/66  Pulse: 79 69 53 80  Temp: 98.2 F (36.8 C) 98.9 F (37.2 C) 98.7 F (37.1 C) 97.6 F (36.4 C)  TempSrc: Oral Oral Oral Axillary  Resp: 18 18 18 20   Height:      Weight:      SpO2: 98% 96% 100% 99%    Intake/Output Summary (Last 24 hours) at 04/21/13 1422 Last data filed at 04/21/13 0825  Gross per 24 hour  Intake      0 ml  Output   1000 ml  Net  -1000 ml   Filed Weights   04/20/13 0200  Weight: 54.885 kg (121 lb)    Exam:   General:  No distress. Aphasia. Sitting comfortably on a chair.  Cardiovascular: S 1, S 2 irregularly irregular. No JVD, murmurs or pedal edema. Telemetry shows A. fib with controlled ventricular rate.  Respiratory: CTA. No increased work of breathing.  Abdomen: Bs present, soft, NT, ND.   Musculoskeletal: no edema.   CNS: Alert and oriented. Mild expressive aphasia-intermittent.  Extremities: Symmetric 5/5 power.  Data Reviewed: Basic Metabolic Panel:  Recent Labs Lab 04/20/13 0019  NA 131*  K 4.1  CL 94*  CO2 28  GLUCOSE 143*  BUN 19  CREATININE 0.71  CALCIUM 9.9   Liver Function Tests: No results found for this basename: AST, ALT, ALKPHOS, BILITOT, PROT, ALBUMIN,  in the last 168 hours No results found for this basename: LIPASE, AMYLASE,  in the last 168 hours No results found for this basename: AMMONIA,  in the last 168 hours CBC:  Recent  Labs Lab 04/20/13 0019  WBC 9.1  HGB 13.3  HCT 38.7  MCV 90.2  PLT 211   Cardiac Enzymes:  Recent Labs Lab 04/20/13 0019  TROPONINI <0.30   BNP (last 3 results) No results found for this basename: PROBNP,  in the last 8760 hours CBG:  Recent Labs Lab 04/20/13 1337 04/20/13 1648 04/20/13 2116 04/21/13 0630 04/21/13 1141  GLUCAP 105* 128* 145* 111*  106*    No results found for this or any previous visit (from the past 240 hour(s)).   Studies:  CT of the brain 04/20/2013 1. No acute intracranial pathology seen on CT. 2. Mild cortical volume loss and minimal small vessel ischemic microangiopathy.   MRI of the brain Acute small to moderate-sized right frontal lobe infarct. Very small acute non hemorrhagic infarct right occipital lobe. No intracranial hemorrhage. Small vessel disease type changes. Atrophy without hydrocephalus. Cervical spondylotic changes with spinal stenosis and mild cord flattening C3-4 and C4-5.   MRA of the brain Intracranial atherosclerotic type changes predominately involving branch vessels.   2D Echocardiogram EF 60-65% with no source of embolus.   Carotid Doppler BIlateral: mild mixed plaque origin ICA. <39% ICA stenosis. Vertebral artery flow is antegrade  EEG 1) focal irregular delta at F8, T8. 2) generalized irregular delta. area of cerebral dysfunction in the right frontotemporal region superimposed on a mild nonspecific generalized cerebral dysfunction (encephalopathy). There was no seizure or seizure predisposition recorded   EKG: A. fib   Scheduled Meds: . loratadine  10 mg Oral Daily  . metoprolol  50 mg Oral BID  . multivitamin with minerals  1 tablet Oral Daily  . pantoprazole  40 mg Oral Daily  . rivaroxaban  15 mg Oral Q supper  . simvastatin  20 mg Oral q1800   Continuous Infusions: . sodium chloride 75 mL/hr at 04/20/13 1136    Active Problems:   Aphasia   Hyperglycemia   HTN (hypertension)   right frontal infarct secondary to afib   New onset a-fib    Time spent: 25 minutes.     [facility]  [facility] Pager [phone]. If 7PM-7AM, please contact night-coverage at [email or web address], password [facility] 04/21/2013, 2:22 PM  LOS: 1 day

## 2013-04-21 NOTE — Progress Notes (Signed)
Physical Therapy Treatment Patient Details Name: Theresa Kennedy MRN: [id number] DOB: 17-Jun-1921 Today's Date: 04/21/2013 Time: 9629-5284 PT Time Calculation (min): 25 min  PT Assessment / Plan / Recommendation Comments on Treatment Session  79 y/o female admitted for confusion and altered speech. MRI showing acute infarcts in right frontal and occipital regions. Pt with much improved speech today with only minimal word finding difficulties. Pt able to perform basic transfers and ambulation with a RW at a supervision level. Educated pt on stroke and proper use of RW. Acute PT to maximize functional independence and saftey for d/c to next venue.     Follow Up Recommendations  SNF;Supervision for mobility/OOB           Equipment Recommendations  Rolling walker with 5" wheels       Frequency Min 4X/week      Precautions / Restrictions Restrictions Weight Bearing Restrictions: No   Pertinent Vitals/Pain Pt denies pain     Mobility  Bed Mobility Bed Mobility: Not assessed Details for Bed Mobility Assistance: pt sitting EOB upon entering Transfers Transfers: Sit to Stand;Stand to Sit Sit to Stand: 5: Supervision;From bed Stand to Sit: 5: Supervision;To chair/3-in-1 Details for Transfer Assistance: v/c for safe hand placement and controlled descent to chair. Ambulation/Gait Ambulation/Gait Assistance: 4: Min guard;5: Supervision;4: Min assist Ambulation Distance (Feet): 600 Feet Assistive device: None;Rolling walker Ambulation/Gait Assistance Details: 100 ft initially with no assistive device; pt min guard but requiring min assist with dynamic activities like head turns. 500 ft, pt using RW and requiring supervision for all gait activities. Gait Pattern: Step-through pattern;Decreased stride length;Trunk flexed General Gait Details: H/o scoliosis, shoulders shifted left throughout gait Stairs: Yes Stairs Assistance: 4: Min guard Stair Management Technique: One rail Right;Alternating  pattern;Forwards Number of Stairs: 4 Wheelchair Mobility Wheelchair Mobility: No Modified Rankin (Stroke Patients Only) Modified Rankin: Moderately severe disability (supervision for saftey)     PT Goals Acute Rehab PT Goals PT Goal: Sit to Stand - Progress: Progressing toward goal PT Goal: Stand to Sit - Progress: Progressing toward goal PT Goal: Ambulate - Progress: Progressing toward goal PT Goal: Up/Down Stairs - Progress: Progressing toward goal  Visit Information  Last PT Received On: 04/21/13 Assistance Needed: +1    Subjective Data  Subjective: I am talking much better today Patient Stated Goal: return home   Cognition  Cognition Arousal/Alertness: Awake/alert Behavior During Therapy: WFL for tasks assessed/performed Overall Cognitive Status: Within Functional Limits for tasks assessed (Oriented to person, place, time, situation)    Balance  Balance Balance Assessed: Yes Static Standing Balance Static Standing - Balance Support: No upper extremity supported Static Standing - Comment/# of Minutes: pt with min guard during both Rhomberg tests. Minimal postural sway present during eyes open test with no LOB. During eyes closed test, pt stating "I don't feel safe"; increased postural sway with no LOB. No assist given Rhomberg - Eyes Opened: 30 Rhomberg - Eyes Closed: 30 High Level Balance High Level Balance Activites: Head turns;Direction changes;Other (comment) (walking around obstacles and speed changes) High Level Balance Comments: Pt able to perform dynamic balance tasks during ambulation with supervision while using RW.  End of Session PT - End of Session Equipment Utilized During Treatment: Gait belt Activity Tolerance: Patient tolerated treatment well Patient left: in chair;with call bell/phone within reach;with family/visitor present Nurse Communication: Mobility status   GP     Payton Doughty 04/21/2013, 9:01 AM Payton Doughty, PT Student

## 2013-04-21 NOTE — Progress Notes (Signed)
Clinical Social Work Department CLINICAL SOCIAL WORK PLACEMENT NOTE 04/21/2013  Patient:  ULYANA, PITONES  Account Number:  [id number] Admit date:  04/20/2013  Clinical Social Worker:  Robin Searing  Date/time:  04/21/2013 12:29 PM  Clinical Social Work is seeking post-discharge placement for this patient at the following level of care:   SKILLED NURSING   (*CSW will update this form in Epic as items are completed)   04/21/2013  Patient/family provided with [facility] Department of Clinical Social Work's list of facilities offering this level of care within the geographic area requested by the patient (or if unable, by the patient's family).  04/21/2013  Patient/family informed of their freedom to choose among providers that offer the needed level of care, that participate in Medicare, Medicaid or managed care program needed by the patient, have an available bed and are willing to accept the patient.  04/21/2013  Patient/family informed of [facility]' ownership interest in [facility], as well as of the fact that they are under no obligation to receive care at this facility.  PASARR submitted to EDS on 04/21/2013 PASARR number received from EDS on 04/21/2013  FL2 transmitted to all facilities in geographic area requested by pt/family on  04/21/2013 FL2 transmitted to all facilities within larger geographic area on   Patient informed that his/her managed care company has contracts with or will negotiate with  certain facilities, including the following:     Patient/family informed of bed offers received:   Patient chooses bed at  Physician recommends and patient chooses bed at    Patient to be transferred to  on   Patient to be transferred to facility by   The following physician request were entered in Epic:   Additional Comments: Reece Levy, MSW, Theresia Majors [phone]

## 2013-04-21 NOTE — Progress Notes (Signed)
Stroke Team Progress Note  HISTORY Marea Fix is an 79 y.o. female with a history of hypertension who was found to be confused and having speech difficulty at 2100 on 04/19/2013. It is unknown at this point when the patient was last seen well. There is no documented history of previous stroke. Patient has been taking aspirin 81 mg daily. CT scan of her head showed no acute intracranial abnormality. Patient was beyond time window for treatment consideration with [facility], even considering when she was found by neighbors at 2100.  She was admitted for further evaluation and treatment.  SUBJECTIVE Her nephew is at the bedside. Patient states she is right handed, however, said he mother was "old school" and her sister (who also uses her left hand a lot) feels the children were trained to only use their right hand.  OBJECTIVE Most recent Vital Signs: Filed Vitals:   04/20/13 1906 04/20/13 2200 04/21/13 0147 04/21/13 0600  BP: 144/58 174/61 125/89 154/62  Pulse: 73 79 69 53  Temp: 98 F (36.7 C) 98.2 F (36.8 C) 98.9 F (37.2 C) 98.7 F (37.1 C)  TempSrc: Oral Oral Oral Oral  Resp: 18 18 18 18   Height:      Weight:      SpO2: 98% 98% 96% 100%   CBG (last 3)   Recent Labs  04/20/13 1648 04/20/13 2116 04/21/13 0630  GLUCAP 128* 145* 111*   IV Fluid Intake:   . sodium chloride 75 mL/hr at 04/20/13 1136   MEDICATIONS  . clopidogrel  75 mg Oral Daily  . enoxaparin (LOVENOX) injection  30 mg Subcutaneous Q24H  . loratadine  10 mg Oral Daily  . metoprolol  50 mg Oral BID  . multivitamin with minerals  1 tablet Oral Daily  . pantoprazole  40 mg Oral Daily  . simvastatin  20 mg Oral q1800   PRN:  acetaminophen  Diet:  Cardiac thin liquids Activity:  As tolerated, Bathroom privileges with assistance DVT Prophylaxis:  Lovenox 30 mg sq daily   CLINICALLY SIGNIFICANT STUDIES Basic Metabolic Panel:   Recent Labs Lab 04/20/13 0019  NA 131*  K 4.1  CL 94*  CO2 28  GLUCOSE 143*   BUN 19  CREATININE 0.71  CALCIUM 9.9   Liver Function Tests: No results found for this basename: AST, ALT, ALKPHOS, BILITOT, PROT, ALBUMIN,  in the last 168 hours CBC:   Recent Labs Lab 04/20/13 0019  WBC 9.1  HGB 13.3  HCT 38.7  MCV 90.2  PLT 211   Coagulation:   Recent Labs Lab 04/20/13 0019  LABPROT 12.0  INR 0.89   Cardiac Enzymes:   Recent Labs Lab 04/20/13 0019  TROPONINI <0.30   Urinalysis: No results found for this basename: COLORURINE, APPERANCEUR, LABSPEC, PHURINE, GLUCOSEU, HGBUR, BILIRUBINUR, KETONESUR, PROTEINUR, UROBILINOGEN, NITRITE, LEUKOCYTESUR,  in the last 168 hours Lipid Panel    Component Value Date/Time   CHOL 229* 04/20/2013 0420   TRIG 40 04/20/2013 0420   HDL 110 04/20/2013 0420   CHOLHDL 2.1 04/20/2013 0420   VLDL 8 04/20/2013 0420   LDLCALC 111* 04/20/2013 0420   HgbA1C  Lab Results  Component Value Date   HGBA1C 5.3 04/20/2013    Urine Drug Screen:     Component Value Date/Time   LABOPIA NONE DETECTED 04/20/2013 0410   COCAINSCRNUR NONE DETECTED 04/20/2013 0410   LABBENZ NONE DETECTED 04/20/2013 0410   AMPHETMU NONE DETECTED 04/20/2013 0410   THCU NONE DETECTED 04/20/2013 0410   LABBARB  NONE DETECTED 04/20/2013 0410    Alcohol Level: No results found for this basename: ETH,  in the last 168 hours  CT of the brain  04/20/2013   1.  No acute intracranial pathology seen on CT. 2.  Mild cortical volume loss and minimal small vessel ischemic microangiopathy.    MRI of the brain  Acute small to moderate-sized right frontal lobe infarct. Very small acute non hemorrhagic infarct right occipital lobe. No intracranial hemorrhage. Small vessel disease type changes. Atrophy without hydrocephalus. Cervical spondylotic changes with spinal stenosis and mild cord flattening C3-4 and C4-5.  MRA of the brain  Intracranial atherosclerotic type changes predominately involving branch vessels.   2D Echocardiogram  EF 60-65% with no source of embolus.   Carotid Doppler   BIlateral: mild mixed plaque origin ICA. <39% ICA stenosis. Vertebral artery flow is antegrade.  CXR    EKG  normal sinus rhythm.   EEG 1) focal irregular delta at F8, T8. 2) generalized irregular delta. area of cerebral dysfunction in the right frontotemporal region superimposed on a mild nonspecific generalized cerebral dysfunction (encephalopathy). There was no seizure or seizure predisposition recorded   Therapy Recommendations SNF  Physical Exam   Frail elderly Caucasian lady currently not in distress.Awake alert. Afebrile. Head is nontraumatic. Neck is supple without bruit. Hearing is normal. Cardiac exam no murmur or gallop. Lungs are clear to auscultation. Distal pulses are well felt. Neurological Exam :  Awake alert oriented x2. Diminished attention, registration and recall. Speech is fluent with only occasional word finding difficulties and slight hesitancy. Able to name, repeat and comprehend quite well. Extraocular moments are full range without nystagmus. Fundi were not visualized. She blinks to threat bilaterally. Face is symmetric without weakness. Tongue is midline. Motor system exam reveals symmetric upper and lower eczema the strength without drift or focal weakness. Both plantars are downgoing. Sensation appears preserved. Gait was not tested.  ASSESSMENT Theresa Kennedy is a 79 y.o. female presenting with expressive aphasia that has improved since admission.  Imaging confirms a right frontal lobe infarct and a very small right occipital lobe infarct. Infarcts felt to be embolic due to new onset atrial fibrillation over night. On aspirin 81 mg orally every day prior to admission. Now on clopidogrel 75 mg orally every day for secondary stroke prevention. Patient with resultant mild expressive aphasia. Work up completed.  atrial fibrillation, new onset Hypertension GERD  Hospital day # 1  TREATMENT/PLAN  Change plavix to xarelto 20 mg for secondary stroke prevention. Stop  lovenox.  Plans for skilled nursing facility at discharge No further stroke workup indicated. Patient has a 10-15% risk of having another stroke over the next year, the highest risk is within 2 weeks of the most recent stroke/TIA (risk of having a stroke following a stroke or TIA is the same). Ongoing risk factor control by Primary Care Physician Stroke Service will sign off. Please call should any needs arise. Follow up with Dr. Pearlean Brownie, Stroke Clinic, in 2 months.  Annie Main, MSN, RN, ANVP-BC, ANP-BC, [facility] Pager: [phone] 04/21/2013 10:27 AM  I have personally obtained a history, examined the patient, evaluated imaging results, and formulated the assessment and plan of care. I agree with the above.  Delia Heady, MD

## 2013-04-21 NOTE — Consult Note (Signed)
CARDIOLOGY CONSULT NOTE  Patient ID: Theresa Kennedy MRN: [id number] DOB/AGE: 77-Nov-1922 77 y.o.  Admit date: 04/20/2013 Primary Physician: Dr. Lysbeth Galas Primary Cardiologist: New, will see Hochrein in [place] Reason for Consultation: Atrial fibrillation  HPI: 79 yo with history of HTN presented to [facility] on 6/1 with expressive aphasia.  She had no prior history of stroke.  Prior to this event, she was doing quite well.  She lives alone and does all her ADLs except someone comes in to help her clean.  She drives and shops.  She denies exertional dyspnea or chest pain.  Balance is good and she has had no falls.    In the hospital, MRI showed small-moderate right frontal lobe ischemic infarct and small right occipital lobe infarct.  She had expressive aphasia but no muscle weakness or ataxia. Her vision was not affected.  Her aphasia has improved, but she still has some word-finding difficulty.  Echo showed EF 60-65%, no significant abnormality.  Carotid dopplers showed mild plaque.  This morning on telemetry, she was noted to be in coarse atrial fibrillation with controlled rate.  Prior to this, she had been in NSR.  In retrospect, she had one episode several weeks ago where she felt her heart racing/beating irregularly.  She has been started on Plavix in the hospital.   Review of systems complete and found to be negative unless listed above in HPI  Past Medical History: 1. HTN 2. GERD 3. CVA: 6/14 acute right frontal lobe ischemic infarct, small right occipital lobe ischemic infarct.  Echo (6/14) with EF 60-65%, mild AI, mild MR.  Carotid dopplers (6/14) with < 39% stenosis bilaterally.  4. Atrial fibrillation: first noted 6/14 in setting of CVA.   FH: Sister with atrial fibrillation  History   Social History  . Marital Status: Single    Spouse Name: N/A    Number of Children: N/A  . Years of Education: N/A   Occupational History  . Not on file.   Social History Main Topics  .  Smoking status: Never Smoker   . Smokeless tobacco: Not on file  . Alcohol Use: No  . Drug Use: No  . Sexually Active: No   Other Topics Concern  . Not on file   Social History Narrative  . Lives alone in [place]     Prescriptions prior to admission  Medication Sig Dispense Refill  . amLODipine (NORVASC) 10 MG tablet Take 10 mg by mouth daily.      Marland Kitchen aspirin EC 81 MG tablet Take 81 mg by mouth daily.      Marland Kitchen CALCIUM-VITAMIN D PO Take 1 tablet by mouth.      . Grape Seed Extract 100 MG CAPS Take by mouth.      . loratadine (CLARITIN) 10 MG tablet Take 10 mg by mouth.      . metoprolol (LOPRESSOR) 50 MG tablet Take 50 mg by mouth 2 (two) times daily.      . Multiple Vitamin (MULTIVITAMIN WITH MINERALS) TABS Take 1 tablet by mouth daily.      Marland Kitchen omeprazole (PRILOSEC) 20 MG capsule Take 20 mg by mouth daily.      . potassium chloride (K-DUR) 10 MEQ tablet Take 10 mEq by mouth 2 (two) times daily.        Physical exam Blood pressure 154/62, pulse 53, temperature 98.7 F (37.1 C), temperature source Oral, resp. rate 18, height 5\' 1"  (1.549 m), weight 121 lb (54.885 kg), SpO2 100.00%.  General: NAD Neck: No JVD, no thyromegaly or thyroid nodule.  Lungs: Clear to auscultation bilaterally with normal respiratory effort. CV: Nondisplaced PMI.  Heart irregular S1/S2, no S3/S4, no murmur.  No peripheral edema.  No carotid bruit.  Normal pedal pulses.  Abdomen: Soft, nontender, no hepatosplenomegaly, no distention.  Skin: Intact without lesions or rashes.  Neurologic: Alert and oriented x 3.  Psych: Normal affect. Extremities: No clubbing or cyanosis.  HEENT: Normal.   Labs:   Lab Results  Component Value Date   WBC 9.1 04/20/2013   HGB 13.3 04/20/2013   HCT 38.7 04/20/2013   MCV 90.2 04/20/2013   PLT 211 04/20/2013    Recent Labs Lab 04/20/13 0019  NA 131*  K 4.1  CL 94*  CO2 28  BUN 19  CREATININE 0.71  CALCIUM 9.9  GLUCOSE 143*   Lab Results  Component Value Date    TROPONINI <0.30 04/20/2013    Lab Results  Component Value Date   CHOL 229* 04/20/2013   Lab Results  Component Value Date   HDL 110 04/20/2013   Lab Results  Component Value Date   LDLCALC 111* 04/20/2013   Lab Results  Component Value Date   TRIG 40 04/20/2013   Lab Results  Component Value Date   CHOLHDL 2.1 04/20/2013   No results found for this basename: LDLDIRECT     EKG: Coarse atrial fibrillation, rate in 70s.   ASSESSMENT AND PLAN:  79 yo presented with CVA in the setting of paroxysmal atrial fibrillation.  Atrial fibrillation first noted today.  Suspect atrial fibrillation with cardiac thromboemboli explains her CVA.  Atrial fibrillation is rate controlled with metoprolol and she denies exertional dyspnea.  Echo shows normal EF.  At home, she has been stable on her feet.  She says she has been quite steady on her feet since her stroke (has only affected her speech).  - Continue metoprolol for rate control. - I would suggest stopping Plavix and starting Xarelto 20 mg daily (GFR > 50).  Will defer to neurology regarding the timing of the initiation of full anticoagulation (Xarelto), but suspect that it could be soon as CVA was a couple of days ago now.  - Will arrange followup at [facility] office with Dr. Antoine Poche.  Will see how she does in atrial fibrillation.  If it persists and she is fatigued or short of breath, could consider DCCV after 4 weeks of Xarelto.  If it persists but she is asymptomatic, could just continue rate control with metoprolol.   Marca Ancona 04/21/2013 11:06 AM   Signed: @ME1 @ 04/21/2013, 10:53 AM Co-Sign MD

## 2013-04-21 NOTE — Progress Notes (Addendum)
Occupational Therapy Evaluation Patient Details Name: Theresa Kennedy MRN: [id number] DOB: 01-20-1921 Today's Date: 04/21/2013 Time: 6213-0865 OT Time Calculation (min): 13 min  OT Assessment / Plan / Recommendation Clinical Impression  79 y/o female admitted for confusion and altered speech. MRI showing acute infarcts in right frontal and occipital regions. Pt with much improved speech today with decr word finding difficulties. Pt would benefit from skilled OT services until she transitions to SNF to improve balance, ADL independence, safety awareness, and using DME/AE.    OT Assessment  Patient needs continued OT Services    Follow Up Recommendations  SNF    Barriers to Discharge Decreased caregiver support          Frequency  Min 2X/week    Precautions / Restrictions Precautions Precautions: Fall Restrictions Weight Bearing Restrictions: No   Pertinent Vitals/Pain Pt reported no pain during session    ADL  Grooming: Performed;Wash/dry hands;Min guard Where Assessed - Grooming: Other (comment);Unsupported standing (BUE support on sink) Toilet Transfer: Performed;Min guard Statistician Method: Sit to Barista: Comfort height toilet;Grab bars Toileting - Clothing Manipulation and Hygiene: Performed;Min guard Where Assessed - Engineer, mining and Hygiene: Sit to stand from 3-in-1 or toilet Equipment Used: Gait belt;Rolling walker Transfers/Ambulation Related to ADLs: Pt needed mod vc's for safety and awareness of RW. Pt says that she ambulated and transitions at home with dependence on the furniture for balance ADL Comments: Pt performed all ADL surrounding toileting min guard. Pt eager to accomplish tasks required, so seemily rushed/impulsive. Pt performed ADL at sink level, but had trouble manuvering RW and needed BUE support on sink    OT Diagnosis: Generalized weakness  OT Problem List: Decreased strength;Impaired balance (sitting  and/or standing);Decreased coordination;Decreased knowledge of use of DME or AE;Decreased safety awareness OT Treatment Interventions: Self-care/ADL training;DME and/or AE instruction;Therapeutic activities;Balance training   OT Goals Acute Rehab OT Goals OT Goal Formulation: With patient Time For Goal Achievement: 05/05/13 Potential to Achieve Goals: Good ADL Goals Pt Will Perform Grooming: with modified independence;Standing at sink ADL Goal: Grooming - Progress: Goal set today Pt Will Perform Upper Body Bathing: with modified independence;Sitting in shower ADL Goal: Upper Body Bathing - Progress: Goal set today Pt Will Perform Lower Body Bathing: with modified independence;Sit to stand in shower ADL Goal: Lower Body Bathing - Progress: Goal set today Pt Will Perform Upper Body Dressing: with modified independence;Sitting, chair;Sitting, bed ADL Goal: Upper Body Dressing - Progress: Goal set today Pt Will Perform Lower Body Dressing: with modified independence;Sit to stand from bed;Sit to stand from chair ADL Goal: Lower Body Dressing - Progress: Goal set today Pt Will Transfer to Toilet: with modified independence;Comfort height toilet ADL Goal: Toilet Transfer - Progress: Goal set today Pt Will Perform Toileting - Clothing Manipulation: Independently;Standing ADL Goal: Toileting - Clothing Manipulation - Progress: Goal set today Pt Will Perform Toileting - Hygiene: Sit to stand from 3-in-1/toilet;with modified independence ADL Goal: Toileting - Hygiene - Progress: Goal set today Pt Will Perform Tub/Shower Transfer: with modified independence ADL Goal: Tub/Shower Transfer - Progress: Goal set today  Visit Information  Last OT Received On: 04/21/13 Assistance Needed: +1    Subjective Data  Subjective: Loves the silver sneaker club 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 does   Prior Functioning     Home Living Lives With: Alone Available Help at Discharge: Family Type of Home: House Home Access: Stairs  to enter Secretary/administrator of Steps: 2 Entrance Stairs-Rails: Right;Left Home Layout: Two level;Able to live  on main level with bedroom/bathroom Bathroom Shower/Tub: Engineer, manufacturing systems: Standard Home Adaptive Equipment: Raised toilet seat with rails Prior Function Level of Independence: Independent Able to Take Stairs?: Yes Driving: Yes Vocation: Retired Musician: Expressive difficulties Dominant Hand: Right         Vision/Perception Vision - History Baseline Vision: No visual deficits Visual History: Cataracts;Other (comment) (Had surgery to correct it) Patient Visual Report: No change from baseline Vision - Assessment Eye Alignment: Within Functional Limits Vision Assessment: Vision tested Ocular Range of Motion: Within Functional Limits Alignment/Gaze Preference: Within Defined Limits Tracking/Visual Pursuits: Able to track stimulus in all quads without difficulty Saccades: Within functional limits Convergence: Within functional limits Additional Comments: Vision was not blurry or double at any time   Cognition  Cognition Arousal/Alertness: Awake/alert Behavior During Therapy: WFL for tasks assessed/performed Overall Cognitive Status: Within Functional Limits for tasks assessed Difficult to assess due to: Impaired communication    Extremity/Trunk Assessment Trunk Assessment Trunk Assessment: Kyphotic     Mobility Bed Mobility Bed Mobility: Not assessed Details for Bed Mobility Assistance: Pt sitting in chair upon entering Transfers Transfers: Sit to Stand;Stand to Sit Sit to Stand: 5: Supervision;From chair/3-in-1;From toilet Stand to Sit: 4: Min guard;To chair/3-in-1;To toilet;With upper extremity assist Details for Transfer Assistance: mod vc's for safe hand placement           End of Session OT - End of Session Equipment Utilized During Treatment: Gait belt;Other (comment) (RW) Activity Tolerance: Patient tolerated  treatment well Patient left: in chair;with call bell/phone within reach;with family/visitor present;with nursing in room Nurse Communication: Mobility status  GO     Sherryl Manges 04/21/2013, 3:10 PM

## 2013-04-21 NOTE — Progress Notes (Signed)
I agree with the following treatment note after reviewing documentation.   Johnston, Nansi [place] Brynn   OTR/L Pager: [phone] Office: [phone] .   

## 2013-04-21 NOTE — Evaluation (Signed)
Speech Language Pathology Evaluation Patient Details Name: Theresa Kennedy MRN: [id number] DOB: 1921/09/13 Today's Date: 04/21/2013 Time: 4098-1191 SLP Time Calculation (min): 50 min  Problem List:  Patient Active Problem List   Diagnosis Date Noted  . right frontal infarct secondary to afib 04/21/2013  . New onset a-fib 04/21/2013  . Paroxysmal atrial fibrillation 04/21/2013  . CVA (cerebral infarction) 04/21/2013  . Acute respiratory failure with hypoxia 04/21/2013  . Aphasia 04/20/2013  . Hyperglycemia 04/20/2013  . HTN (hypertension) 04/20/2013   Past Medical History:  Past Medical History  Diagnosis Date  . Hypertension   . GERD (gastroesophageal reflux disease)    Past Surgical History:  Past Surgical History  Procedure Laterality Date  . Abdominal hysterectomy     HPI:  Theresa Kennedy is a 79 y.o. female with history of hypertension, GERD spent most of her day today with having her 67 year old sister who is admitted at [facility] today and then had supper at [facility] with her niece and was dropped off at home around 5 PM.   Assessment / Plan / Recommendation Clinical Impression  Pt. presents with a moderate expressive aphasia, characterized by dysnomia, resulting in dysfluent conversation, with frequent pauses and hesitations.  Frequent omission of words (especially verbs) result in disjointed communication.  Pt. is aware of her errors, but states her speech is much better than it was yesterday.Marland Kitchen    SLP Assessment  Patient needs continued Speech Lanaguage Pathology Services    Follow Up Recommendations  Skilled Nursing facility    Frequency and Duration    2 weeks   Pertinent Vitals/Pain n/a   SLP Goals  SLP Goals Potential to Achieve Goals: Good Progress/Goals/Alternative treatment plan discussed with pt/caregiver and they: Agree SLP Goal #1: Pt. will generate complete sentences including nouns and verbs to describe what is happening in picutres with  min. phonemic and/or written word cues. SLP Goal #2: Pt. will utilize compensatory strategies to communicate her thoughts and feelings in conversation efficiently with moderate cues.  SLP Evaluation Prior Functioning  Cognitive/Linguistic Baseline: Within functional limits Type of Home: House Lives With: Alone Available Help at Discharge: Family Vocation: Retired   IT consultant  Overall Cognitive Status: Within Functional Limits for tasks assessed Arousal/Alertness: Awake/alert Orientation Level: Oriented to person;Oriented to place;Oriented to situation (Stated "[id number]; did not know she had a stroke) Attention:  (Intact) Memory: Appears intact Awareness: Appears intact Problem Solving: Appears intact Safety/Judgment: Impaired    Comprehension  Auditory Comprehension Overall Auditory Comprehension: Appears within functional limits for tasks assessed Yes/No Questions: Within Functional Limits Commands: Within Functional Limits Conversation: Simple Other Conversation Comments: Dysfluent, dysnomia; pauses/hesitations throughout. EffectiveTechniques: Extra processing time;Pausing Visual Recognition/Discrimination Discrimination: Not tested Reading Comprehension Reading Status: Not tested    Expression Expression Primary Mode of Expression: Verbal Verbal Expression Overall Verbal Expression: Impaired Initiation: No impairment Automatic Speech:  (Intact) Level of Generative/Spontaneous Verbalization: Phrase Repetition: No impairment Naming: Impairment Responsive: 76-100% accurate Confrontation: Impaired Convergent: 75-100% accurate Divergent: 75-100% accurate Verbal Errors: Aware of errors Pragmatics: No impairment Effective Techniques: Semantic cues;Sentence completion;Phonemic cues;Written cues Non-Verbal Means of Communication: Not applicable Written Expression Dominant Hand: Right Written Expression: Not tested   Oral / Motor Oral Motor/Sensory Function Overall  Oral Motor/Sensory Function: Appears within functional limits for tasks assessed Motor Speech Overall Motor Speech: Impaired Respiration: Within functional limits Phonation: Normal Resonance: Within functional limits Articulation: Within functional limitis Intelligibility: Intelligible Motor Planning: Impaired Level of Impairment: Press photographer Errors: Inconsistent   GO  Maryjo Rochester T 04/21/2013, 2:51 PM

## 2013-04-22 DIAGNOSIS — J9600 Acute respiratory failure, unspecified whether with hypoxia or hypercapnia: Secondary | ICD-10-CM

## 2013-04-22 LAB — BASIC METABOLIC PANEL
BUN: 25 mg/dL — ABNORMAL HIGH (ref 6–23)
CO2: 30 mEq/L (ref 19–32)
GFR calc non Af Amer: 52 mL/min — ABNORMAL LOW (ref >90)
Glucose, Bld: 93 mg/dL (ref 70–99)
Potassium: 3.5 mEq/L (ref 3.5–5.1)

## 2013-04-22 LAB — GLUCOSE, CAPILLARY
Glucose-Capillary: 89 mg/dL (ref 70–99)
Glucose-Capillary: 101 mg/dL — ABNORMAL HIGH (ref 70–99)

## 2013-04-22 MED ORDER — RIVAROXABAN 15 MG PO TABS: 15.0000 mg | ORAL_TABLET | Freq: Every day | ORAL | Status: DC

## 2013-04-22 MED ORDER — SIMVASTATIN 20 MG PO TABS: 20.0000 mg | ORAL_TABLET | Freq: Every day | ORAL | Status: DC

## 2013-04-22 MED ORDER — RIVAROXABAN 20 MG PO TABS: 20.0000 mg | ORAL_TABLET | Freq: Every day | ORAL | Status: DC

## 2013-04-22 NOTE — Progress Notes (Signed)
Clinical Social Work Department CLINICAL SOCIAL WORK PLACEMENT NOTE 04/22/2013  Patient:  Theresa Kennedy, Theresa Kennedy  Account Number:  [id number] Admit date:  04/20/2013  Clinical Social Worker:  Robin Searing  Date/time:  04/21/2013 12:29 PM  Clinical Social Work is seeking post-discharge placement for this patient at the following level of care:   SKILLED NURSING   (*CSW will update this form in Epic as items are completed)   04/21/2013  Patient/family provided with [facility] Department of Clinical Social Work's list of facilities offering this level of care within the geographic area requested by the patient (or if unable, by the patient's family).  04/21/2013  Patient/family informed of their freedom to choose among providers that offer the needed level of care, that participate in Medicare, Medicaid or managed care program needed by the patient, have an available bed and are willing to accept the patient.  04/21/2013  Patient/family informed of [facility]' ownership interest in [facility], as well as of the fact that they are under no obligation to receive care at this facility.  PASARR submitted to EDS on 04/21/2013 PASARR number received from EDS on 04/21/2013  FL2 transmitted to all facilities in geographic area requested by pt/family on  04/21/2013 FL2 transmitted to all facilities within larger geographic area on   Patient informed that his/her managed care company has contracts with or will negotiate with  certain facilities, including the following:     Patient/family informed of bed offers received:  04/22/2013 Patient chooses bed at [facility] Physician recommends and patient chooses bed at    Patient to be transferred to [facility] on  04/22/2013 Patient to be transferred to facility by ems  The following physician request were entered in Epic:   Additional Comments: Reece Levy, MSW, Theresia Majors [phone]

## 2013-04-22 NOTE — Care Management Note (Signed)
    Page 1 of 1   04/22/2013     3:29:31 PM   CARE MANAGEMENT NOTE 04/22/2013  Patient:  Theresa Kennedy,Theresa Kennedy   Account Number:  [id number]  Date Initiated:  04/20/2013  Documentation initiated by:  Elmer Bales  Subjective/Objective Assessment:   Pt admitted for stroke.  Lives home alone.     Action/Plan:   Will follow for discharge needs  PT/OT evals-recommended SNF   Anticipated DC Date:  04/22/2013   Anticipated DC Plan:  SKILLED NURSING FACILITY  In-house referral  Clinical Social Worker      DC Planning Services  CM consult      Choice offered to / List presented to:             Status of service:  In process, will continue to follow Medicare Important Message given?   (If response is "NO", the following Medicare IM given date fields will be blank) Date Medicare IM given:   Date Additional Medicare IM given:    Discharge Disposition:  SKILLED NURSING FACILITY  Per UR Regulation:  Reviewed for med. necessity/level of care/duration of stay  If discussed at Long Length of Stay Meetings, dates discussed:    Comments:

## 2013-04-22 NOTE — Progress Notes (Signed)
Subjective:  Patient is doing very well this morning.  Her speech is fluid and appropriate.  No chest pain or shortness of breath.  Today on telemetry she is back in normal sinus rhythm.  Objective:  Vital Signs in the last 24 hours: Temp:  [97.4 F (36.3 C)-98.5 F (36.9 C)] 97.4 F (36.3 C) (06/04 1610) Pulse Rate:  [56-83] 56 (06/04 0613) Resp:  [16-20] 16 (06/04 0613) BP: (124-145)/(47-66) 134/49 mmHg (06/04 0613) SpO2:  [98 %-99 %] 99 % (06/04 0613)  Intake/Output from previous day: 06/03 0701 - 06/04 0700 In: -  Out: 2400 [Urine:2400] Intake/Output from this shift:    . loratadine  10 mg Oral Daily  . metoprolol  50 mg Oral BID  . multivitamin with minerals  1 tablet Oral Daily  . pantoprazole  40 mg Oral Daily  . rivaroxaban  15 mg Oral Q supper  . simvastatin  20 mg Oral q1800      Physical Exam: The patient appears to be in no distress.  Head and neck exam reveals that the pupils are equal and reactive.  The extraocular movements are full.  There is no scleral icterus.  Mouth and pharynx are benign.  No lymphadenopathy.  No carotid bruits.  The jugular venous pressure is normal.  Thyroid is not enlarged or tender.  Chest is clear to percussion and auscultation.  No rales or rhonchi.  Expansion of the chest is symmetrical.  Heart reveals no abnormal lift or heave.  First and second heart sounds are normal.  There is no murmur gallop rub or click.  The abdomen is soft and nontender.  Bowel sounds are normoactive.  There is no hepatosplenomegaly or mass.  There are no abdominal bruits.  Extremities reveal no phlebitis or edema.  Pedal pulses are good.  There is no cyanosis or clubbing.  Neurologic exam is normal strength and no lateralizing weakness.  No sensory deficits.  Integument reveals no rash  Lab Results:  Recent Labs  04/20/13 0019  WBC 9.1  HGB 13.3  PLT 211    Recent Labs  04/20/13 0019 04/22/13 0500  NA 131* 137  K 4.1 3.5  CL 94*  100  CO2 28 30  GLUCOSE 143* 93  BUN 19 25*  CREATININE 0.71 0.94    Recent Labs  04/20/13 0019  TROPONINI <0.30   Hepatic Function Panel No results found for this basename: PROT, ALBUMIN, AST, ALT, ALKPHOS, BILITOT, BILIDIR, IBILI,  in the last 72 hours  Recent Labs  04/20/13 0420  CHOL 229*   No results found for this basename: PROTIME,  in the last 72 hours  Imaging: Mri Brain Without Contrast  04/20/2013   *RADIOLOGY REPORT*  Clinical Data:  Speech disturbance.  High blood pressure.  MRI BRAIN WITHOUT CONTRAST MRA HEAD WITHOUT CONTRAST  Technique: Multiplanar, multiecho pulse sequences of the brain and surrounding structures were obtained according to standard protocol without intravenous contrast.  Angiographic images of the head were obtained using MRA technique without contrast.  Comparison: 04/20/2013 CT.  No comparison MR.  MRI HEAD  Findings:  Acute small to moderate-sized right frontal lobe infarct.  Very small acute non hemorrhagic infarct right occipital lobe.  No intracranial hemorrhage.  Small vessel disease type changes.  Atrophy without hydrocephalus.  No intracranial mass lesion detected on this unenhanced exam.  Cervical spondylotic changes with spinal stenosis and mild cord flattening C3-4 and C4-5.  IMPRESSION: Acute small to moderate-sized right frontal lobe infarct.  Very small acute non hemorrhagic infarct right occipital lobe.  No intracranial hemorrhage.  Small vessel disease type changes.  Atrophy without hydrocephalus.  Cervical spondylotic changes with spinal stenosis and mild cord flattening C3-4 and C4-5.  MRA HEAD  Findings: Anterior circulation without medium or large size vessel significant stenosis or occlusion.  Middle cerebral artery branch vessel irregularity greater on the right.  This includes a high-grade stenosis of proximal right middle cerebral artery M2 branch.  Ectatic vertebral arteries and basilar artery.  Mild narrowing distal left vertebral  artery.  No high-grade stenosis of the basilar artery.  Poor delineation right PICA with narrowed irregular left PICA.  Prominent size AICAs.  Narrowing P2 segment right posterior cerebral artery.  Narrowing posterior cerebral artery branch vessels bilaterally.  No aneurysm or vascular formation noted.  IMPRESSION: Intracranial atherosclerotic type changes predominately involving branch vessels. Please see above.  This has been made a PRA call report utilizing dashboard call feature.   Original Report Authenticated By: Lacy Duverney, M.D.   Mr Mra Head/brain Wo Cm  04/20/2013   *RADIOLOGY REPORT*  Clinical Data:  Speech disturbance.  High blood pressure.  MRI BRAIN WITHOUT CONTRAST MRA HEAD WITHOUT CONTRAST  Technique: Multiplanar, multiecho pulse sequences of the brain and surrounding structures were obtained according to standard protocol without intravenous contrast.  Angiographic images of the head were obtained using MRA technique without contrast.  Comparison: 04/20/2013 CT.  No comparison MR.  MRI HEAD  Findings:  Acute small to moderate-sized right frontal lobe infarct.  Very small acute non hemorrhagic infarct right occipital lobe.  No intracranial hemorrhage.  Small vessel disease type changes.  Atrophy without hydrocephalus.  No intracranial mass lesion detected on this unenhanced exam.  Cervical spondylotic changes with spinal stenosis and mild cord flattening C3-4 and C4-5.  IMPRESSION: Acute small to moderate-sized right frontal lobe infarct.  Very small acute non hemorrhagic infarct right occipital lobe.  No intracranial hemorrhage.  Small vessel disease type changes.  Atrophy without hydrocephalus.  Cervical spondylotic changes with spinal stenosis and mild cord flattening C3-4 and C4-5.  MRA HEAD  Findings: Anterior circulation without medium or large size vessel significant stenosis or occlusion.  Middle cerebral artery branch vessel irregularity greater on the right.  This includes a high-grade  stenosis of proximal right middle cerebral artery M2 branch.  Ectatic vertebral arteries and basilar artery.  Mild narrowing distal left vertebral artery.  No high-grade stenosis of the basilar artery.  Poor delineation right PICA with narrowed irregular left PICA.  Prominent size AICAs.  Narrowing P2 segment right posterior cerebral artery.  Narrowing posterior cerebral artery branch vessels bilaterally.  No aneurysm or vascular formation noted.  IMPRESSION: Intracranial atherosclerotic type changes predominately involving branch vessels. Please see above.  This has been made a PRA call report utilizing dashboard call feature.   Original Report Authenticated By: Lacy Duverney, M.D.    Cardiac Studies: Telemetry shows normal sinus rhythm Assessment/Plan:  Paroxysmal atrial fibrillation, now back in normal sinus rhythm, with recent embolic stroke and transient improving expressive aphasia.  Plan: Home today on Xarelto for long-term anticoagulation for paroxysmal atrial fibrillation.  She will continue on her previous home dose of metoprolol.  She should followup from a cardiac standpoint in several weeks with Dr. Antoine Poche in [place].   LOS: 2 days    Cassell Clement 04/22/2013, 8:36 AM

## 2013-04-22 NOTE — Progress Notes (Signed)
SNF bed offers rec'd and patient and family have selected SNF bed at [facility] where patient's sister is also in SNF rehab. Plan transfer later today via EMS.  Reece Levy, MSW, Theresia Majors [phone]

## 2013-04-22 NOTE — Progress Notes (Signed)
Pt d/c to SNF. Assessment stable. Report called to accepting RN at [facility].

## 2013-04-22 NOTE — Discharge Summary (Addendum)
Physician Discharge Summary  Theresa Kennedy WUJ:[id number] DOB: 21-Aug-1921 DOA: 04/20/2013  PCP: [name], MD  Admit date: 04/20/2013 Discharge date: 04/22/2013  Time spent: 35 minutes   Discharge Diagnoses:  Embolic  CVA (cerebral infarction) right frontal lobe    Aphasia   Hyperglycemia   HTN (hypertension)   Paroxysmal atrial fibrillation   Acute respiratory failure with hypoxia - resolved    Discharge Condition: fair   Diet recommendation: heart healthy   Filed Weights   04/20/13 0200  Weight: 54.885 kg (121 lb)    History of present illness:  Theresa Kennedy is a 79 y.o. female with history of hypertension, GERD spent most of her day today with having her 14 year old sister who is admitted at [facility] today and then had supper at [facility] with her niece and was dropped off at home around 5 PM.  Another niece called her around 8pm, she called multiple times finally around 10pm she picked up the phone and kept mumbling "end of late" and later kept saying " end linger".  She was then brought to the ER as a code stroke, seen by neurology. Not felt to be a TPA candidate due to delay in presentation and low NIH score.  Upon evaluation the emergency room noted to have expressive aphasia, CT head unremarkable   Hospital Course:  1. Acute Right Brain Infarcts with expressive aphasia: Patient left-handed. It's possible that her speech area is on right side. Infarcts felt to be embolic do to PAF. Stroke workup completed. Started on Xarelto on 6/3. Started on  statin for LDL 111. patient will go to SNF for rehabilitation. Outpatient followup with Dr. Pearlean Brownie in 2 months. Hemoglobin A1c 5.3 so no diabetic meds prescribed  2. Paroxysmal atrial fibrillation:  Cardiology consultation appreciated. Continue metoprolol and started on Xarelto. Outpatient followup with Dr. Antoine Poche in [place]. 3. Mild Hyponatremia: ? SIADH. Appears euvolemic. DC IV fluids and follow BMP in  a.m.resolved by 6/4 4. Hypertension: Continue with Metoprolol. Reasonably controlled.      Procedures: CT of the brain 04/20/2013 1. No acute intracranial pathology seen on CT. 2. Mild cortical volume loss and minimal small vessel ischemic microangiopathy.  MRI of the brain Acute small to moderate-sized right frontal lobe infarct. Very small acute non hemorrhagic infarct right occipital lobe. No intracranial hemorrhage. Small vessel disease type changes. Atrophy without hydrocephalus. Cervical spondylotic changes with spinal stenosis and mild cord flattening C3-4 and C4-5.  MRA of the brain Intracranial atherosclerotic type changes predominately involving branch vessels.  2D Echocardiogram EF 60-65% with no source of embolus.  Carotid Doppler BIlateral: mild mixed plaque origin ICA. <39% ICA stenosis. Vertebral artery flow is antegrade. EKG normal sinus rhythm.  EEG 1) focal irregular delta at F8, T8. 2) generalized irregular delta. area of cerebral dysfunction in the right frontotemporal region superimposed on a mild nonspecific generalized cerebral dysfunction (encephalopathy). There was no seizure or seizure predisposition recorded      Consultations:  Neurology   Cardiology    Discharge Exam: Filed Vitals:   04/21/13 2256 04/22/13 0214 04/22/13 0613 04/22/13 1000  BP: 127/47 124/47 134/49 141/56  Pulse: 62 61 56 67  Temp: 97.5 F (36.4 C) 97.4 F (36.3 C) 97.4 F (36.3 C) 97.6 F (36.4 C)  TempSrc: Oral Oral Oral Oral  Resp: 16 16 16 18   Height:      Weight:      SpO2: 99% 99% 99% 97%    General: alert  Cardiovascular: irreg  irreg  Respiratory: ctab  Neurological Exam :  Awake alert oriented x2. Diminished attention, registration and recall. Speech is fluent with only occasional word finding difficulties and slight hesitancy. Able to name, repeat and comprehend quite well. Extraocular moments are full range without nystagmus. She blinks to threat bilaterally. Face is  symmetric without weakness. Tongue is midline. Motor system exam reveals symmetric upper and lower extremity strength without drift or focal weakness. Both plantars are downgoing. Sensation appears preserved. Gait was not tested.   Discharge Instructions      Discharge Orders   Future Orders Complete By Expires     Diet - low sodium heart healthy  As directed     Increase activity slowly  As directed         Medication List    STOP taking these medications       aspirin EC 81 MG tablet     potassium chloride 10 MEQ tablet  Commonly known as:  K-DUR      TAKE these medications       amLODipine 10 MG tablet  Commonly known as:  NORVASC  Take 10 mg by mouth daily.     CALCIUM-VITAMIN D PO  Take 1 tablet by mouth.     Grape Seed Extract 100 MG Caps  Take by mouth.     loratadine 10 MG tablet  Commonly known as:  CLARITIN  Take 10 mg by mouth.     metoprolol 50 MG tablet  Commonly known as:  LOPRESSOR  Take 50 mg by mouth 2 (two) times daily.     multivitamin with minerals Tabs  Take 1 tablet by mouth daily.     omeprazole 20 MG capsule  Commonly known as:  PRILOSEC  Take 20 mg by mouth daily.     Rivaroxaban 15 MG Tabs tablet  Commonly known as:  XARELTO  Take 1 tablet (15 mg total) by mouth daily with supper.     simvastatin 20 MG tablet  Commonly known as:  ZOCOR  Take 1 tablet (20 mg total) by mouth daily at 6 PM.       Not on File Follow-up Information   Follow up with SETHI,PRAMODKUMAR P, MD. Schedule an appointment as soon as possible for a visit in 2 months. (stroke clinic)    Contact information:   [address] [place] [address] [phone]       Follow up with Rollene Rotunda, MD. (call and make appointment for visit in [facility] )    Contact information:   [address] [place] [address] [phone]        The results of significant diagnostics from this hospitalization  (including imaging, microbiology, ancillary and laboratory) are listed below for reference.    Significant Diagnostic Studies: Ct Head Wo Contrast  04/20/2013   *RADIOLOGY REPORT*  Clinical Data: Code stroke.  CT HEAD WITHOUT CONTRAST  Technique:  Contiguous axial images were obtained from the base of the skull through the vertex without contrast.  Comparison: None.  Findings: There is no evidence of acute infarction, mass lesion, or intra- or extra-axial hemorrhage on CT.  Prominence of the ventricles and sulci reflects mild cortical volume loss.  Minimal periventricular white matter change likely reflects small vessel ischemic microangiopathy.  The brainstem and fourth ventricle are within normal limits.  The basal ganglia are unremarkable in appearance.  The cerebral hemispheres demonstrate grossly normal gray-white differentiation. No mass effect or  midline shift is seen.  There is no evidence of fracture; visualized osseous structures are unremarkable in appearance.  The orbits are within normal limits. The paranasal sinuses and mastoid air cells are well-aerated.  No significant soft tissue abnormalities are seen.  IMPRESSION:  1.  No acute intracranial pathology seen on CT. 2.  Mild cortical volume loss and minimal small vessel ischemic microangiopathy.  These results were called by telephone on 04/20/2013 at 12:18 a.m. to Dr. Marisa Severin, who verbally acknowledged these results.   Original Report Authenticated By: Tonia Ghent, M.D.   Mri Brain Without Contrast  04/20/2013   *RADIOLOGY REPORT*  Clinical Data:  Speech disturbance.  High blood pressure.  MRI BRAIN WITHOUT CONTRAST MRA HEAD WITHOUT CONTRAST  Technique: Multiplanar, multiecho pulse sequences of the brain and surrounding structures were obtained according to standard protocol without intravenous contrast.  Angiographic images of the head were obtained using MRA technique without contrast.  Comparison: 04/20/2013 CT.  No comparison MR.  MRI  HEAD  Findings:  Acute small to moderate-sized right frontal lobe infarct.  Very small acute non hemorrhagic infarct right occipital lobe.  No intracranial hemorrhage.  Small vessel disease type changes.  Atrophy without hydrocephalus.  No intracranial mass lesion detected on this unenhanced exam.  Cervical spondylotic changes with spinal stenosis and mild cord flattening C3-4 and C4-5.  IMPRESSION: Acute small to moderate-sized right frontal lobe infarct.  Very small acute non hemorrhagic infarct right occipital lobe.  No intracranial hemorrhage.  Small vessel disease type changes.  Atrophy without hydrocephalus.  Cervical spondylotic changes with spinal stenosis and mild cord flattening C3-4 and C4-5.  MRA HEAD  Findings: Anterior circulation without medium or large size vessel significant stenosis or occlusion.  Middle cerebral artery branch vessel irregularity greater on the right.  This includes a high-Kennedy stenosis of proximal right middle cerebral artery M2 branch.  Ectatic vertebral arteries and basilar artery.  Mild narrowing distal left vertebral artery.  No high-Kennedy stenosis of the basilar artery.  Poor delineation right PICA with narrowed irregular left PICA.  Prominent size AICAs.  Narrowing P2 segment right posterior cerebral artery.  Narrowing posterior cerebral artery branch vessels bilaterally.  No aneurysm or vascular formation noted.  IMPRESSION: Intracranial atherosclerotic type changes predominately involving branch vessels. Please see above.  This has been made a PRA call report utilizing dashboard call feature.   Original Report Authenticated By: Lacy Duverney, M.D.   Mr Mra Head/brain Wo Cm  04/20/2013   *RADIOLOGY REPORT*  Clinical Data:  Speech disturbance.  High blood pressure.  MRI BRAIN WITHOUT CONTRAST MRA HEAD WITHOUT CONTRAST  Technique: Multiplanar, multiecho pulse sequences of the brain and surrounding structures were obtained according to standard protocol without intravenous  contrast.  Angiographic images of the head were obtained using MRA technique without contrast.  Comparison: 04/20/2013 CT.  No comparison MR.  MRI HEAD  Findings:  Acute small to moderate-sized right frontal lobe infarct.  Very small acute non hemorrhagic infarct right occipital lobe.  No intracranial hemorrhage.  Small vessel disease type changes.  Atrophy without hydrocephalus.  No intracranial mass lesion detected on this unenhanced exam.  Cervical spondylotic changes with spinal stenosis and mild cord flattening C3-4 and C4-5.  IMPRESSION: Acute small to moderate-sized right frontal lobe infarct.  Very small acute non hemorrhagic infarct right occipital lobe.  No intracranial hemorrhage.  Small vessel disease type changes.  Atrophy without hydrocephalus.  Cervical spondylotic changes with spinal stenosis and mild cord flattening C3-4 and  C4-5.  MRA HEAD  Findings: Anterior circulation without medium or large size vessel significant stenosis or occlusion.  Middle cerebral artery branch vessel irregularity greater on the right.  This includes a high-Kennedy stenosis of proximal right middle cerebral artery M2 branch.  Ectatic vertebral arteries and basilar artery.  Mild narrowing distal left vertebral artery.  No high-Kennedy stenosis of the basilar artery.  Poor delineation right PICA with narrowed irregular left PICA.  Prominent size AICAs.  Narrowing P2 segment right posterior cerebral artery.  Narrowing posterior cerebral artery branch vessels bilaterally.  No aneurysm or vascular formation noted.  IMPRESSION: Intracranial atherosclerotic type changes predominately involving branch vessels. Please see above.  This has been made a PRA call report utilizing dashboard call feature.   Original Report Authenticated By: Lacy Duverney, M.D.    Microbiology: No results found for this or any previous visit (from the past 240 hour(s)).   Labs: Basic Metabolic Panel:  Recent Labs Lab 04/20/13 0019 04/22/13 0500   NA 131* 137  K 4.1 3.5  CL 94* 100  CO2 28 30  GLUCOSE 143* 93  BUN 19 25*  CREATININE 0.71 0.94  CALCIUM 9.9 9.3   Liver Function Tests: No results found for this basename: AST, ALT, ALKPHOS, BILITOT, PROT, ALBUMIN,  in the last 168 hours No results found for this basename: LIPASE, AMYLASE,  in the last 168 hours No results found for this basename: AMMONIA,  in the last 168 hours CBC:  Recent Labs Lab 04/20/13 0019  WBC 9.1  HGB 13.3  HCT 38.7  MCV 90.2  PLT 211   Cardiac Enzymes:  Recent Labs Lab 04/20/13 0019  TROPONINI <0.30   BNP: BNP (last 3 results) No results found for this basename: PROBNP,  in the last 8760 hours CBG:  Recent Labs Lab 04/21/13 0630 04/21/13 1141 04/21/13 1635 04/21/13 2254 04/22/13 0616  GLUCAP 111* 106* 95 109* 89       Signed:  Siobhan Zaro  [facility] 04/22/2013, 11:16 AM

## 2013-04-23 ENCOUNTER — Telehealth: Payer: Self-pay | Admitting: *Deleted

## 2013-05-20 ENCOUNTER — Encounter: Payer: Self-pay | Admitting: Cardiology

## 2013-05-20 ENCOUNTER — Ambulatory Visit (INDEPENDENT_AMBULATORY_CARE_PROVIDER_SITE_OTHER): Payer: Medicare Other | Admitting: Cardiology

## 2013-05-20 VITALS — BP 178/78 | HR 74 | Ht 64.0 in | Wt 118.4 lb

## 2013-05-20 DIAGNOSIS — I4891 Unspecified atrial fibrillation: Secondary | ICD-10-CM

## 2013-05-20 DIAGNOSIS — I480 Paroxysmal atrial fibrillation: Secondary | ICD-10-CM

## 2013-05-20 NOTE — Patient Instructions (Addendum)
The current medical regimen is effective;  continue present plan and medications.  Follow up in 4 months with Dr Hochrein.  You will receive a letter in the mail 2 months before you are due.  Please call us when you receive this letter to schedule your follow up appointment.  

## 2013-05-20 NOTE — Progress Notes (Signed)
HPI The patient presents for followup after recent hospitalization with CVA. She had a small to moderate sized right frontal lobe ischemic infarct. She had expressive aphasia. Carotid Doppler demonstrated mild plaque. Echo demonstrated well-preserved ejection fraction. She was noted to have new onset atrial fibrillation. She was started on Xarelto.    Since that time she has been in rehab.  She is doing well with this with some slight hesitancy in her speech but no other deficits. She hasn't noticed any palpitations since her hospitalization. She's had no presyncope or syncope. She denies any chest pressure, neck or arm discomfort. She's had no shortness of breath. She doesn't think she's had any complications taking the blood thinner.  Allergies  Allergen Reactions  . Sulfa Antibiotics     Current Outpatient Prescriptions  Medication Sig Dispense Refill  . CALCIUM-VITAMIN D PO Take 1 tablet by mouth.      . Grape Seed Extract 100 MG CAPS Take by mouth.      . loratadine (CLARITIN) 10 MG tablet Take 10 mg by mouth.      . metoprolol (LOPRESSOR) 50 MG tablet Take 50 mg by mouth 2 (two) times daily.      . Multiple Vitamin (MULTIVITAMIN WITH MINERALS) TABS Take 1 tablet by mouth daily.      Marland Kitchen omeprazole (PRILOSEC) 20 MG capsule Take 20 mg by mouth daily.      . Rivaroxaban (XARELTO) 15 MG TABS tablet Take 1 tablet (15 mg total) by mouth daily with supper.  30 tablet  0  . simvastatin (ZOCOR) 20 MG tablet Take 1 tablet (20 mg total) by mouth daily at 6 PM.  30 tablet  0  . amLODipine (NORVASC) 10 MG tablet Take 10 mg by mouth daily.       No current facility-administered medications for this visit.    Past Medical History  Diagnosis Date  . Hypertension   . GERD (gastroesophageal reflux disease)     Past Surgical History  Procedure Laterality Date  . Abdominal hysterectomy      ROS:  As stated in the HPI and negative for all other systems.  PHYSICAL EXAM BP 178/78  Pulse 74   Ht 5\' 4"  (1.626 m)  Wt 118 lb 6.4 oz (53.706 kg)  BMI 20.31 kg/m2 GENERAL:  Well appearing for her age HEENT:  Pupils equal round and reactive, fundi not visualized, oral mucosa unremarkable NECK:  No jugular venous distention, waveform within normal limits, carotid upstroke brisk and symmetric, no bruits, no thyromegaly LYMPHATICS:  No cervical, inguinal adenopathy LUNGS:  Clear to auscultation bilaterally BACK:  No CVA tenderness CHEST:  Unremarkable HEART:  PMI not displaced or sustained,S1 and S2 within normal limits, no S3, no S4, no clicks, no rubs, no murmurs ABD:  Flat, positive bowel sounds normal in frequency in pitch, no bruits, no rebound, no guarding, no midline pulsatile mass, no hepatomegaly, no splenomegaly EXT:  2 plus pulses throughout, no edema, no cyanosis no clubbing SKIN:  No rashes no nodules NEURO:  Cranial nerves II through XII grossly intact, motor grossly intact throughout PSYCH:  Cognitively intact, oriented to person place and time   EKG:  NSR rate 66, axis within normal limits, first degree block, no acute ST-T wave changes. 05/20/2013   ASSESSMENT AND PLAN  ATRIAL FIBRILLATION:  I have reviewed all available hospital records. She's not had any symptomatic recurrence of her fibrillation.  She tolerates anticoagulation. No change in therapy is indicated. She will let  me know if she has any increasing palpitations.  HTN:  Her blood pressure is unusually elevated today. This can be followed at the rehab center.  No other change in therapy is indicated.

## 2013-05-27 ENCOUNTER — Ambulatory Visit (INDEPENDENT_AMBULATORY_CARE_PROVIDER_SITE_OTHER): Payer: Medicare Other | Admitting: Neurology

## 2013-05-27 ENCOUNTER — Encounter: Payer: Self-pay | Admitting: Neurology

## 2013-05-27 VITALS — BP 153/75 | HR 85 | Ht <= 58 in | Wt 120.0 lb

## 2013-05-27 DIAGNOSIS — I4891 Unspecified atrial fibrillation: Secondary | ICD-10-CM

## 2013-05-27 DIAGNOSIS — I6350 Cerebral infarction due to unspecified occlusion or stenosis of unspecified cerebral artery: Secondary | ICD-10-CM

## 2013-05-27 NOTE — Patient Instructions (Addendum)
Continues xarelto for atrial fibrillation and strict control of lipids with LDL cholesterol goal below 100 mg percent and hypertension with blood pressure goal below 130/90. I have advised her to participate in cognitively challenging activities like playing bridge, solving crossword puzzles, sudoku and take 2 fish oil capsules daily for her mild cognitive impairment which I expect to improve as it is stroke related. She may be discharged home from the skilled nursing facility. Return for followup in 3 months with Larita Fife, NP or call earlier if necessary.

## 2013-05-28 NOTE — Progress Notes (Signed)
[facility] [address] [place]. [place] [address] [phone]       OFFICE FOLLOW-UP NOTE  [name] Date of Birth:  Oct 25, 1921 Medical Record Number:  [id number]   HPI: Theresa Kennedy is an 78 y.o. female with a history of hypertension who was found to be confused and having speech difficulty at 2100 on 04/19/2013. It was unknown    when the patient was last seen well. There was no documented history of previous stroke. Patient has been taking aspirin 81 mg daily. CT scan of her head showed no acute intracranial abnormality. Patient was beyond time window for treatment consideration with TPA,  CT of the brain  04/20/2013 showed no acute intracranial pathology seen on CT.but mild cortical volume loss and minimal small vessel ischemic microangiopathy.  MRI of the brain showed small to moderate-sized right frontal lobe infarct. Very small acute non hemorrhagic infarct right occipital lobe.  MRA of the brain showed Intracranial atherosclerotic type changes predominately involving branch vessels. 2D Echocardiogram  showed EF 60-65% with no source of embolus. Carotid Doppler showed BIlateral: mild mixed plaque origin ICA. <39% ICA stenosis. Vertebral artery flow is antegrade.lipid profile showed total cholesterol 229 and LDL 111 mg percent. Hemoglobin A1c was normal at 5.3. Urine drug screen was negative. EEG showed focal slowing at  F8 and T8 but without definite evidence of seizures. She was started on xareltofor her atrial fibrillation and did well with therapies and [facility] and has shown significant improvement and plans to return home to live by herself soon the patient's son and daughter feel that she is noticed some new cognitive difficulties particularly with numbers which she cannot and poor balance her checkbook. She plans to have a [employer] and a CNA will visit her on a  daily basis. She plans to participate in the [employer] program.     ROS:   14 system review of systems is positive for   PMH:  Past Medical History  Diagnosis Date  . Hypertension   . GERD (gastroesophageal reflux disease)   . Atrial fibrillation     Social History:  History   Social History  . Marital Status: Single    Spouse Name: N/A    Number of Children: N/A  . Years of Education: N/A   Occupational History  . Not on file.   Social History Main Topics  . Smoking status: Never Smoker   . Smokeless tobacco: Not on file  . Alcohol Use: No  . Drug Use: No  . Sexually Active: No   Other Topics Concern  . Not on file   Social History Narrative  . No narrative on file    Medications:   Current Outpatient Prescriptions on File Prior to Visit  Medication Sig Dispense Refill  . amLODipine (NORVASC) 10 MG tablet Take 10 mg by mouth daily.      . Grape Seed Extract 100 MG CAPS Take by mouth.      . loratadine (CLARITIN) 10 MG tablet Take 10 mg by mouth.      . metoprolol (LOPRESSOR) 50 MG tablet Take 50 mg by mouth 2 (two) times daily.      . Multiple Vitamin (MULTIVITAMIN WITH MINERALS) TABS Take 1 tablet by mouth daily.      Marland Kitchen omeprazole (PRILOSEC) 20 MG capsule Take 20 mg by mouth daily.      . Rivaroxaban (XARELTO) 15 MG TABS tablet Take 1 tablet (15 mg  total) by mouth daily with supper.  30 tablet  0   No current facility-administered medications on file prior to visit.    Allergies:   Allergies  Allergen Reactions  . Sulfa Antibiotics    Filed Vitals:   05/27/13 1505  BP: 153/75  Pulse: 85    Physical Exam General: well developed, well nourished, seated, in no evident distress Head: head normocephalic and atraumatic. Orohparynx benign Neck: supple with no carotid or supraclavicular bruits Cardiovascular: regular rate and rhythm, no murmurs Musculoskeletal: Moderate kyphosis the Skin:  no rash/petichiae Vascular:  Normal pulses all extremities  Neurologic Exam Mental Status: Awake and fully alert. Oriented to  place and time. Recent and remote memory intact. . Mood and affect appropriate. Mini-Mental status exam score 22/30 with 4 deficits in attention, one in orientation and one in the groin. Animal fluency test score 7 only. Clock trying 1/4 only. Cranial Nerves: Fundoscopic exam reveals sharp disc margins. Pupils equal, briskly reactive to light. Extraocular movements full without nystagmus. Visual fields full to confrontation. Hearing intact. Facial sensation intact. Face, tongue, palate moves normally and symmetrically.  Motor: Normal bulk and tone. Normal strength in all tested extremity muscles. Mild fine action tremor of outstretched bilateral upper extremities. No cogwheel rigidity Sensory.: intact to tough and pinprick and vibratory.  Coordination: Rapid alternating movements normal in all extremities. Finger-to-nose and heel-to-shin performed accurately bilaterally. Gait and Station: Arises from chair without difficulty. Stance is normal. Gait demonstrates normal stride length and balance . Not able to heel, toe and tandem walk without difficulty.  Reflexes: 1+ and symmetric. Toes downgoing.     ASSESSMENT: 79 year old lady with transient episode of expressive aphasia in June 2014 secondary to cardioembolic right frontal and left occipital infarcts from atrial fibrillation. Mild age appropriate cognitive impairment. Vascular risk factors of hypertension,hyperlipidimia, atrial fibrillation and age.    PLAN: Continue xarelto for atrial fibrillation and strict control of lipids with LDL cholesterol goal below 100 mg percent and hypertension with blood pressure goal below 130/90. I have advised her to participate in cognitively challenging activities like playing bridge, solving crossword puzzles, sudoku and take 2 fish oil capsules daily for her mild cognitive impairment which I expect to improve as it is stroke related. She may be discharged home from the skilled nursing facility. Return for  followup in 3 months with Theresa Fife, NP or call earlier if necessary.

## 2013-08-27 ENCOUNTER — Ambulatory Visit (INDEPENDENT_AMBULATORY_CARE_PROVIDER_SITE_OTHER): Payer: Medicare Other | Admitting: Nurse Practitioner

## 2013-08-27 ENCOUNTER — Encounter: Payer: Self-pay | Admitting: Nurse Practitioner

## 2013-08-27 ENCOUNTER — Encounter (INDEPENDENT_AMBULATORY_CARE_PROVIDER_SITE_OTHER): Payer: Self-pay

## 2013-08-27 VITALS — BP 170/68 | HR 77 | Temp 97.9°F | Ht 61.0 in | Wt 118.0 lb

## 2013-08-27 DIAGNOSIS — I6350 Cerebral infarction due to unspecified occlusion or stenosis of unspecified cerebral artery: Secondary | ICD-10-CM

## 2013-08-27 DIAGNOSIS — I4891 Unspecified atrial fibrillation: Secondary | ICD-10-CM

## 2013-08-27 NOTE — Progress Notes (Signed)
[facility]  PATIENT: Theresa Kennedy DOB: 1921-06-29   REASON FOR VISIT: follow up HISTORY FROM: patient, niece, nephew  HISTORY OF PRESENT ILLNESS: UPDATE 08/27/13 (LL):  Ms. Panek comes to office for stroke revisit.  She is doing very well and has no complaints.  She moved back home in July and wears a life alert at all times. She goes to [employer] 3 times a week.  Her BP normally runs 120-130's /60-70's but is elevated in office today, at 170/68.  She is tolerating Xarelto daily for afib and has no side effects of bleeding or significant bruising.  05/27/13 (PS):  Theresa Kennedy is an 79 y.o. female with a history of hypertension who was found to be confused and having speech difficulty at 2100 on 04/19/2013. It was unknown when the patient was last seen well. There was no documented history of previous stroke. Patient has been taking aspirin 81 mg daily. CT scan of her head showed no acute intracranial abnormality. Patient was beyond time window for treatment consideration with TPA, CT of the brain 04/20/2013 showed no acute intracranial pathology seen on CT.but mild cortical volume loss and minimal small vessel ischemic microangiopathy. MRI of the brain showed small to moderate-sized right frontal lobe infarct. Very small acute non hemorrhagic infarct right occipital lobe. MRA of the brain showed Intracranial atherosclerotic type changes predominately involving branch vessels. 2D Echocardiogram showed EF 60-65% with no source of embolus. Carotid Doppler showed BIlateral: mild mixed plaque origin ICA. <39% ICA stenosis. Vertebral artery flow is antegrade.lipid profile showed total cholesterol 229 and LDL 111 mg percent. Hemoglobin A1c was normal at 5.3. Urine drug screen was negative. EEG showed focal slowing at F8 and T8 but without definite evidence of seizures. She was started on xarelto for her atrial fibrillation and did well with therapies and is currently living at the  [facility] in [place] and has shown significant improvement and plans to return home to live by herself soon.  The patient's son and daughter feel that she has noticed some new cognitive difficulties particularly with numbers which she cannot and poor balance her checkbook. She plans to have a [employer] and a CNA will visit her on a daily basis. She plans to participate in the [employer] program.   REVIEW OF SYSTEMS: Full 14 system review of systems performed and notable only for: nothing.  ALLERGIES: Allergies  Allergen Reactions  . Sulfa Antibiotics     HOME MEDICATIONS: Outpatient Prescriptions Prior to Visit  Medication Sig Dispense Refill  . amLODipine (NORVASC) 10 MG tablet Take 10 mg by mouth daily.      Marland Kitchen atorvastatin (LIPITOR) 10 MG tablet Take 10 mg by mouth daily.      . Grape Seed Extract 100 MG CAPS Take by mouth.      . loratadine (CLARITIN) 10 MG tablet Take 10 mg by mouth.      . metoprolol (LOPRESSOR) 50 MG tablet Take 50 mg by mouth 2 (two) times daily.      . Multiple Vitamin (MULTIVITAMIN WITH MINERALS) TABS Take 1 tablet by mouth daily.      Marland Kitchen omeprazole (PRILOSEC) 20 MG capsule Take 20 mg by mouth daily.      . Rivaroxaban (XARELTO) 15 MG TABS tablet Take 1 tablet (15 mg total) by mouth daily with supper.  30 tablet  0   No facility-administered medications prior to visit.    PAST MEDICAL HISTORY: Past Medical History  Diagnosis Date  .  Hypertension   . GERD (gastroesophageal reflux disease)   . Atrial fibrillation     PAST SURGICAL HISTORY: Past Surgical History  Procedure Laterality Date  . Abdominal hysterectomy      FAMILY HISTORY: No family history on file.  SOCIAL HISTORY: History   Social History  . Marital Status: Single    Spouse Name: N/A    Number of Children: N/A  . Years of Education: N/A   Occupational History  . Not on file.   Social History Main Topics  . Smoking status: Never Smoker   . Smokeless tobacco: Not on file  .  Alcohol Use: No  . Drug Use: No  . Sexual Activity: No   Other Topics Concern  . Not on file   Social History Narrative  . No narrative on file     PHYSICAL EXAM  Filed Vitals:   08/27/13 1416  BP: 170/68  Pulse: 77  Temp: 97.9 F (36.6 C)  TempSrc: Oral  Height: 5\' 1"  (1.549 m)  Weight: 118 lb (53.524 kg)   Body mass index is 22.31 kg/(m^2).  General: well developed, well nourished, seated, in no evident distress  Head: head normocephalic and atraumatic. Orohparynx benign  Neck: supple with no carotid or supraclavicular bruits  Cardiovascular: regular rate and rhythm, no murmurs  Musculoskeletal: Moderate kyphoscoliosis Skin: no rash/petichiae  Vascular: Normal pulses all extremities   Neurologic Exam  Mental Status: Awake and fully alert. Oriented to place and time. Recent and remote memory intact. Mood and affect appropriate. (Last visit Mini-Mental status exam score 22/30 with 4 deficits in attention, one in orientation. Animal fluency test score 7 only. Clock drawing 1/4 only.) Cranial Nerves:  Pupils equal, briskly reactive to light. Extraocular movements full without nystagmus. Visual fields full to confrontation. Hearing intact. Facial sensation intact. Face, tongue, palate moves normally and symmetrically.  Motor: Normal bulk and tone. Normal strength in all tested extremity muscles. Mild fine action tremor of outstretched bilateral upper extremities. No cogwheel rigidity  Sensory: intact to tough and pinprick and vibratory.  Coordination: Rapid alternating movements normal in all extremities. Finger-to-nose and heel-to-shin performed accurately bilaterally.  Gait and Station: Arises from chair without difficulty. Stance is normal. Gait demonstrates normal stride length and balance . Able to heel, toe walk without difficulty. Tandem unsteady. Romberg negative. Reflexes: 1+ and symmetric.   DIAGNOSTIC DATA (LABS, IMAGING, TESTING) - I reviewed patient records,  labs, notes, testing and imaging myself where available.  Lab Results  Component Value Date   WBC 9.1 04/20/2013   HGB 13.3 04/20/2013   HCT 38.7 04/20/2013   MCV 90.2 04/20/2013   PLT 211 04/20/2013      Component Value Date/Time   NA 137 04/22/2013 0500   K 3.5 04/22/2013 0500   CL 100 04/22/2013 0500   CO2 30 04/22/2013 0500   GLUCOSE 93 04/22/2013 0500   BUN 25* 04/22/2013 0500   CREATININE 0.94 04/22/2013 0500   CALCIUM 9.3 04/22/2013 0500   GFRNONAA 52* 04/22/2013 0500   GFRAA 61* 04/22/2013 0500   Lab Results  Component Value Date   CHOL 229* 04/20/2013   HDL 110 04/20/2013   LDLCALC 111* 04/20/2013   TRIG 40 04/20/2013   CHOLHDL 2.1 04/20/2013   Lab Results  Component Value Date   HGBA1C 5.3 04/20/2013   CAROTID DOPPLERS 04/20/13 : BIlateral: mild mixed plaque origin ICA. <39% ICA stenosis.  Vertebral artery flow is antegrade.  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 Mild age appropriate cognitive impairment. Vascular risk factors of hypertension, hyperlipidimia, atrial fibrillation and age.   PLAN:  Continue xarelto for atrial fibrillation and strict control of lipids with LDL cholesterol goal below 100 mg percent and hypertension with blood pressure goal below 130/90. I have advised her to participate in cognitively challenging activities like playing bridge, solving crossword puzzles, sudoku and take 2 fish oil capsules daily for her mild cognitive impairment which I expect to improve as it is stroke related.   Return for followup in 6 months with Larita Fife, NP or call earlier if necessary.   Ronal Fear, MSN, NP-C 08/27/2013, 2:58 PM [facility] [address] [place], [place] [address] [phone]

## 2013-08-27 NOTE — Patient Instructions (Signed)
Continue xarelto for atrial fibrillation and strict control of lipids with LDL cholesterol goal below 100 mg percent and hypertension with blood pressure goal below 130/90. I have advised her to participate in cognitively challenging activities like playing bridge, solving crossword puzzles, sudoku and take 2 fish oil capsules daily for her mild cognitive impairment which I expect to improve as it is stroke related.   Return for followup in 6 months with Theresa Fife, NP or call earlier if necessary.

## 2013-12-09 ENCOUNTER — Encounter: Payer: Self-pay | Admitting: Cardiology

## 2013-12-09 ENCOUNTER — Ambulatory Visit (INDEPENDENT_AMBULATORY_CARE_PROVIDER_SITE_OTHER): Payer: Medicare HMO | Admitting: Cardiology

## 2013-12-09 VITALS — BP 159/65 | HR 65 | Ht 61.0 in | Wt 118.0 lb

## 2013-12-09 DIAGNOSIS — I480 Paroxysmal atrial fibrillation: Secondary | ICD-10-CM

## 2013-12-09 DIAGNOSIS — I4891 Unspecified atrial fibrillation: Secondary | ICD-10-CM

## 2013-12-09 NOTE — Patient Instructions (Signed)
The current medical regimen is effective;  continue present plan and medications.  Follow up in 6 months with Dr Hochrein.  You will receive a letter in the mail 2 months before you are due.  Please call us when you receive this letter to schedule your follow up appointment.  

## 2013-12-09 NOTE — Progress Notes (Signed)
HPI The patient presents for followup of atrial fib and CVA. She had a small to moderate sized right frontal lobe ischemic infarct last year. She had expressive aphasia. Carotid Doppler demonstrated mild plaque. Echo demonstrated well-preserved ejection fraction. She was noted to have new onset atrial fibrillation. She was started on Xarelto.    She is at home from rehab and doing well.  The patient denies any new symptoms such as chest discomfort, neck or arm discomfort. There has been no new shortness of breath, PND or orthopnea. There have been no reported palpitations, presyncope or syncope.  She does not notice her palpitations.    Allergies  Allergen Reactions  . Sulfa Antibiotics     Current Outpatient Prescriptions  Medication Sig Dispense Refill  . amLODipine (NORVASC) 10 MG tablet Take 10 mg by mouth daily.      Marland Kitchen atorvastatin (LIPITOR) 10 MG tablet Take 10 mg by mouth daily.      . Calcium Carb-Cholecalciferol (CALCIUM 1000 + D) 1000-800 MG-UNIT TABS Take 1,000 mg by mouth 2 (two) times daily.      . fish oil-omega-3 fatty acids 1000 MG capsule Take 2 g by mouth 2 (two) times daily.      . Grape Seed Extract 100 MG CAPS Take by mouth.      . loratadine (CLARITIN) 10 MG tablet Take 10 mg by mouth.      . losartan (COZAAR) 50 MG tablet       . metoprolol (LOPRESSOR) 50 MG tablet Take 50 mg by mouth 2 (two) times daily.      . Multiple Vitamin (MULTIVITAMIN WITH MINERALS) TABS Take 1 tablet by mouth daily.      Marland Kitchen omeprazole (PRILOSEC) 20 MG capsule Take 20 mg by mouth daily.      . Rivaroxaban (XARELTO) 15 MG TABS tablet Take 1 tablet (15 mg total) by mouth daily with supper.  30 tablet  0   No current facility-administered medications for this visit.    Past Medical History  Diagnosis Date  . Hypertension   . GERD (gastroesophageal reflux disease)   . Atrial fibrillation     Past Surgical History  Procedure Laterality Date  . Abdominal hysterectomy      ROS:  As  stated in the HPI and negative for all other systems.  PHYSICAL EXAM BP 159/65  Pulse 65  Ht 5\' 1"  (1.549 m)  Wt 118 lb (53.524 kg)  BMI 22.31 kg/m2 GENERAL:  Well appearing for her age NECK:  No jugular venous distention, waveform within normal limits, carotid upstroke brisk and symmetric, no bruits, no thyromegaly LUNGS:  Clear to auscultation bilaterally BACK:  No CVA tenderness, mild lordosis.  CHEST:  Unremarkable HEART:  PMI not displaced or sustained,S1 and S2 within normal limits, no S3, no S4, no clicks, no rubs, no murmurs ABD:  Flat, positive bowel sounds normal in frequency in pitch, no bruits, no rebound, no guarding, no midline pulsatile mass, no hepatomegaly, no splenomegaly EXT:  2 plus pulses throughout, no edema, no cyanosis no clubbing   EKG:  NSR rate 66, axis within normal limits, first degree block, no acute ST-T wave changes. PACs.   12/09/2013   ASSESSMENT AND PLAN  ATRIAL FIBRILLATION:  She's not had any symptomatic recurrence of her fibrillation.  She tolerates anticoagulation. No change in therapy is indicated. She does not notice her PACs.   HTN:  Her blood pressure is unusually elevated at the doctors office but not at  home.  No other change in therapy is indicated.

## 2014-03-02 ENCOUNTER — Ambulatory Visit: Payer: Medicare Other | Admitting: Nurse Practitioner

## 2014-03-17 ENCOUNTER — Encounter: Payer: Self-pay | Admitting: Nurse Practitioner

## 2014-03-17 ENCOUNTER — Ambulatory Visit (INDEPENDENT_AMBULATORY_CARE_PROVIDER_SITE_OTHER): Payer: Commercial Managed Care - HMO | Admitting: Nurse Practitioner

## 2014-03-17 ENCOUNTER — Encounter (INDEPENDENT_AMBULATORY_CARE_PROVIDER_SITE_OTHER): Payer: Self-pay

## 2014-03-17 VITALS — BP 152/70 | HR 66 | Temp 97.7°F | Ht 61.0 in | Wt 120.0 lb

## 2014-03-17 DIAGNOSIS — I6350 Cerebral infarction due to unspecified occlusion or stenosis of unspecified cerebral artery: Secondary | ICD-10-CM

## 2014-03-17 NOTE — Progress Notes (Deleted)
Subjective:    Patient ID: Theresa Kennedy is a 80 y.o. female.  HPI {Common ambulatory SmartLinks:[id number]}  Review of Systems  Constitutional: Negative.   HENT: Negative.   Eyes: Negative.   Respiratory: Negative.   Cardiovascular: Negative.   Gastrointestinal: Negative.   Endocrine: Negative.   Genitourinary: Negative.   Musculoskeletal: Negative.   Skin: Negative.   Allergic/Immunologic: Negative.   Neurological: Negative.   Hematological: Negative.   Psychiatric/Behavioral: Positive for sleep disturbance (snoring).    Objective:  Neurologic Exam  Physical Exam  Assessment:   ***  Plan:   ***

## 2014-03-17 NOTE — Patient Instructions (Signed)
Continue xarelto for atrial fibrillation and strict control of lipids with LDL cholesterol goal below 100 mg percent and hypertension with blood pressure goal below 130/90. I have advised her to participate in cognitively challenging activities like playing bridge, solving crossword puzzles, sudoku.  Follow up in 1 year, sooner as needed. May follow up with Primary Care Physician if you would like to.

## 2014-03-17 NOTE — Progress Notes (Signed)
PATIENT: Theresa CzechBessie Penaloza DOB: 10/30/1921  REASON FOR VISIT: follow up HISTORY FROM: patient  HISTORY OF PRESENT ILLNESS: 05/27/13 (PS): Geryl RankinsBessie Konrad DoloresLester is an 80 y.o. female with a history of hypertension who was found to be confused and having speech difficulty at 2100 on 04/19/2013. It was unknown when the patient was last seen well. There was no documented history of previous stroke. Patient has been taking aspirin 81 mg daily. CT scan of her head showed no acute intracranial abnormality. Patient was beyond time window for treatment consideration with TPA, CT of the brain 04/20/2013 showed no acute intracranial pathology seen on CT.but mild cortical volume loss and minimal small vessel ischemic microangiopathy. MRI of the brain showed small to moderate-sized right frontal lobe infarct. Very small acute non hemorrhagic infarct right occipital lobe. MRA of the brain showed Intracranial atherosclerotic type changes predominately involving branch vessels. 2D Echocardiogram showed EF 60-65% with no source of embolus. Carotid Doppler showed BIlateral: mild mixed plaque origin ICA. <39% ICA stenosis. Vertebral artery flow is antegrade.lipid profile showed total cholesterol 229 and LDL 111 mg percent. Hemoglobin A1c was normal at 5.3. Urine drug screen was negative. EEG showed focal slowing at F8 and T8 but without definite evidence of seizures. She was started on xarelto for her atrial fibrillation and did well with therapies and is currently living at the [facility] in [place] and has shown significant improvement and plans to return home to live by herself soon. The patient's son and daughter feel that she has noticed some new cognitive difficulties particularly with numbers which she cannot and poor balance her checkbook. She plans to have a [employer] and a CNA will visit her on a daily basis. She plans to participate in the [employer] program.   UPDATE 03/17/14 (LL): Ms. Konrad DoloresLester comes to office for stroke  revisit. She is doing very well and has no complaints. She moved back home in July and wears a life alert at all times. She goes to [employer] times a week. Her BP normally runs 120-130's /60-70's but is elevated in office today, at 152/70. She is tolerating Xarelto daily for afib and has no side effects of bleeding or significant bruising.    REVIEW OF SYSTEMS: Full 14 system review of systems performed and notable only for:  sleep disturbance (snoring).   ALLERGIES: Allergies  Allergen Reactions  . Sulfa Antibiotics     HOME MEDICATIONS: Outpatient Prescriptions Prior to Visit  Medication Sig Dispense Refill  . amLODipine (NORVASC) 10 MG tablet Take 10 mg by mouth daily.      Marland Kitchen. atorvastatin (LIPITOR) 10 MG tablet Take 10 mg by mouth daily.      . Calcium Carb-Cholecalciferol (CALCIUM 1000 + D) 1000-800 MG-UNIT TABS Take 1,000 mg by mouth 2 (two) times daily.      . Grape Seed Extract 100 MG CAPS Take by mouth.      . loratadine (CLARITIN) 10 MG tablet Take 10 mg by mouth.      . losartan (COZAAR) 50 MG tablet       . metoprolol (LOPRESSOR) 50 MG tablet Take 50 mg by mouth 2 (two) times daily.      . Multiple Vitamin (MULTIVITAMIN WITH MINERALS) TABS Take 1 tablet by mouth daily.      . Rivaroxaban (XARELTO) 15 MG TABS tablet Take 1 tablet (15 mg total) by mouth daily with supper.  30 tablet  0  . fish oil-omega-3 fatty acids 1000 MG capsule  Take 2 g by mouth 2 (two) times daily.      Marland Kitchen. omeprazole (PRILOSEC) 20 MG capsule Take 40 mg by mouth daily.        No facility-administered medications prior to visit.     PHYSICAL EXAM  Filed Vitals:   03/17/14 1453  BP: 152/70  Pulse: 66  Temp: 97.7 F (36.5 C)  TempSrc: Oral  Height: 5\' 1"  (1.549 m)  Weight: 120 lb (54.432 kg)   Body mass index is 22.69 kg/(m^2).  General: well developed, well nourished, seated, in no evident distress  Head: head normocephalic and atraumatic. Orohparynx benign  Neck: supple with no  carotid or supraclavicular bruits  Cardiovascular: regular rate and rhythm, no murmurs  Musculoskeletal: Moderate kyphoscoliosis  Skin: no rash/petichiae  Vascular: Normal pulses all extremities   Neurologic Exam  Mental Status: Awake and fully alert. (Last visit Mini-Mental status exam score 22/30 with 4 deficits in attention, one in orientation. Animal fluency test score 7 only. Clock drawing 1/4 only.)  Cranial Nerves: Pupils equal, briskly reactive to light. Extraocular movements full without nystagmus. Visual fields full to confrontation. Hearing intact. Facial sensation intact. Face, tongue, palate moves normally and symmetrically.  Motor: Normal bulk and tone. Normal strength in all tested extremity muscles. Mild fine action tremor of outstretched bilateral upper extremities. No cogwheel rigidity  Sensory: intact to tough and pinprick and vibratory.  Coordination: Rapid alternating movements normal in all extremities. Finger-to-nose and heel-to-shin performed accurately bilaterally.  Gait and Station: Arises from chair without difficulty. Stance is normal. Gait demonstrates normal stride length and balance . Able to heel, toe walk without difficulty. Tandem unsteady. Romberg negative.  Reflexes: 1+ and symmetric.    ASSESSMENT AND PLAN CAROTID DOPPLERS 04/20/13 : BIlateral: mild mixed plaque origin ICA. <39% ICA stenosis. Vertebral artery flow is antegrade.  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 Mild age appropriate cognitive impairment. Vascular risk factors of hypertension, hyperlipidimia, atrial fibrillation and age.   PLAN:  Continue xarelto for atrial fibrillation and strict control of lipids with LDL cholesterol goal below 100 mg percent and hypertension with blood pressure goal below 130/90. I have advised her to participate in  cognitively challenging activities like playing bridge, solving crossword puzzles, sudoku.  Follow up in 1 year, sooner as needed.  May follow up with PCP if so desires.  Ronal FearLYNN E. Tamekia Rotter, MSN, NP-C 03/17/2014, 3:36 PM [facility] [address], [address] [place], [place] [id number] [phone]  Note: This document was prepared with digital dictation and possible smart phrase technology. Any transcriptional errors that result from this process are unintentional.

## 2014-06-16 ENCOUNTER — Ambulatory Visit (INDEPENDENT_AMBULATORY_CARE_PROVIDER_SITE_OTHER): Payer: Medicare HMO | Admitting: Cardiology

## 2014-06-16 ENCOUNTER — Encounter: Payer: Self-pay | Admitting: Cardiology

## 2014-06-16 VITALS — BP 150/64 | HR 66 | Ht 63.0 in | Wt 118.0 lb

## 2014-06-16 DIAGNOSIS — I4891 Unspecified atrial fibrillation: Secondary | ICD-10-CM

## 2014-06-16 DIAGNOSIS — I4819 Other persistent atrial fibrillation: Secondary | ICD-10-CM

## 2014-06-16 NOTE — Patient Instructions (Signed)
The current medical regimen is effective;  continue present plan and medications.  Follow up in 1 year with Dr Hochrein.  You will receive a letter in the mail 2 months before you are due.  Please call us when you receive this letter to schedule your follow up appointment.  

## 2014-06-16 NOTE — Progress Notes (Signed)
HPI The patient presents for followup of atrial fib and CVA in 2014. She had a small to moderate sized right frontal lobe ischemic infarct. She had expressive aphasia. Carotid Doppler demonstrated mild plaque. Echo demonstrated well-preserved ejection fraction. She was noted to have new onset atrial fibrillation. She was started on Xarelto.  She was in sinus at the last visit.  She returns for routine follow up.  She is doing well and denies any  Chest pain palpitations, syncope or syncope. She's exercising at the [facility]. Of note she is in atrial fibrillation today but she doesn't notice it.  Allergies  Allergen Reactions  . Sulfa Antibiotics     Current Outpatient Prescriptions  Medication Sig Dispense Refill  . amLODipine (NORVASC) 10 MG tablet Take 10 mg by mouth daily.      Marland Kitchen atorvastatin (LIPITOR) 10 MG tablet Take 10 mg by mouth daily.      . Calcium Carb-Cholecalciferol (CALCIUM 1000 + D) 1000-800 MG-UNIT TABS Take 1,000 mg by mouth 2 (two) times daily.      . Grape Seed Extract 100 MG CAPS Take by mouth.      . loratadine (CLARITIN) 10 MG tablet Take 10 mg by mouth.      . losartan (COZAAR) 50 MG tablet       . metoprolol (LOPRESSOR) 50 MG tablet Take 50 mg by mouth 2 (two) times daily.      . Multiple Vitamin (MULTIVITAMIN WITH MINERALS) TABS Take 1 tablet by mouth daily.      . OMEGA-3 FATTY ACIDS-VITAMIN E PO Take 1,200 mg by mouth daily.      Marland Kitchen omeprazole (PRILOSEC) 40 MG capsule Take 40 mg by mouth daily.      . Rivaroxaban (XARELTO) 15 MG TABS tablet Take 1 tablet (15 mg total) by mouth daily with supper.  30 tablet  0   No current facility-administered medications for this visit.    Past Medical History  Diagnosis Date  . Hypertension   . GERD (gastroesophageal reflux disease)   . Atrial fibrillation     Past Surgical History  Procedure Laterality Date  . Abdominal hysterectomy      ROS:  As stated in the HPI and negative for all other systems.  PHYSICAL  EXAM BP 150/64  Pulse 66  Ht 5\' 3"  (1.6 m)  Wt 118 lb (53.524 kg)  BMI 20.91 kg/m2 GENERAL:  Well appearing for her age NECK:  No jugular venous distention, waveform within normal limits, carotid upstroke brisk and symmetric, no bruits, no thyromegaly LUNGS:  Clear to auscultation bilaterally BACK:  No CVA tenderness, mild lordosis.  CHEST:  Unremarkable HEART:  PMI not displaced or sustained,S1 and S2 within normal limits, no S3,  no clicks, no rubs, no murmurs, irregular ABD:  Flat, positive bowel sounds normal in frequency in pitch, no bruits, no rebound, no guarding, no midline pulsatile mass, no hepatomegaly, no splenomegaly EXT:  2 plus pulses throughout, no edema, no cyanosis no clubbing   EKG:   Atrial fibrillation rate 52, axis within normal limits, first degree block, no acute ST-T wave changes Celeste is and she uses to trigger her heart rhythm this time.   06/16/2014   ASSESSMENT AND PLAN  ATRIAL FIBRILLATION:  She does not notice that she is atrial fib.   She tolerates anticoagulation. No change in therapy is indicated. No change in therapy is indicated.    HTN:  Her blood pressure is unusually elevated at the doctors office  but not at home.  No other change in therapy is indicated.

## 2014-06-29 ENCOUNTER — Telehealth: Payer: Self-pay | Admitting: Cardiology

## 2014-06-29 ENCOUNTER — Encounter: Payer: Self-pay | Admitting: Cardiology

## 2014-06-29 ENCOUNTER — Ambulatory Visit (INDEPENDENT_AMBULATORY_CARE_PROVIDER_SITE_OTHER): Payer: Medicare HMO | Admitting: Cardiology

## 2014-06-29 VITALS — BP 162/78 | HR 77 | Ht 63.0 in | Wt 117.2 lb

## 2014-06-29 DIAGNOSIS — I10 Essential (primary) hypertension: Secondary | ICD-10-CM

## 2014-06-29 NOTE — Patient Instructions (Signed)
Your physician recommends that you schedule a follow-up appointment in: 6 weeks with dr. Antoine PocheHochrein  Keep a record of your blood pressure

## 2014-06-29 NOTE — Telephone Encounter (Signed)
Pt. Seen today by Dr. Antoine PocheHochrein

## 2014-06-29 NOTE — Progress Notes (Signed)
HPI The patient presents for followup of atrial fib and CVA in 2014. She had a small to moderate sized right frontal lobe ischemic infarct. She had expressive aphasia. Carotid Doppler demonstrated mild plaque. Echo demonstrated well-preserved ejection fraction. She was noted to have new onset atrial fibrillation. She was started on Xarelto.  She was in atrial fib when I last saw her.  She was added to my schedule today because she had shortness of breath acutely last night. She said she was finally not bad and she was not particularly comfortable. She was in gasping for breath but she felt little short of breath. She went to lie down on the bed and had to sleep slightly propped up on the cough. However, this morning she feels fine.  Ambulating this morning she's not having any further shortness of breath, PND or orthopnea. She's not having any chest pressure, neck or arm discomfort. She's not having any weight gain or edema. She hasn't noticed any palpitations. She was in fibrillation last time and remains in fibrillation today. She did a cystoscopy this was yesterday which is unusual for her.  Allergies  Allergen Reactions  . Sulfa Antibiotics     Current Outpatient Prescriptions  Medication Sig Dispense Refill  . amLODipine (NORVASC) 10 MG tablet Take 10 mg by mouth daily.      Marland Kitchen. atorvastatin (LIPITOR) 10 MG tablet Take 10 mg by mouth daily.      . Calcium Carb-Cholecalciferol (CALCIUM 1000 + D) 1000-800 MG-UNIT TABS Take 1,000 mg by mouth 2 (two) times daily.      . Grape Seed Extract 100 MG CAPS Take by mouth.      . loratadine (CLARITIN) 10 MG tablet Take 10 mg by mouth.      . losartan (COZAAR) 50 MG tablet       . metoprolol (LOPRESSOR) 50 MG tablet Take 50 mg by mouth 2 (two) times daily.      . Multiple Vitamin (MULTIVITAMIN WITH MINERALS) TABS Take 1 tablet by mouth daily.      . OMEGA-3 FATTY ACIDS-VITAMIN E PO Take 1,200 mg by mouth daily.      Marland Kitchen. omeprazole (PRILOSEC) 40 MG  capsule Take 40 mg by mouth daily.      . Rivaroxaban (XARELTO) 15 MG TABS tablet Take 1 tablet (15 mg total) by mouth daily with supper.  30 tablet  0   No current facility-administered medications for this visit.    Past Medical History  Diagnosis Date  . Hypertension   . GERD (gastroesophageal reflux disease)   . Atrial fibrillation     Past Surgical History  Procedure Laterality Date  . Abdominal hysterectomy      ROS:  As stated in the HPI and negative for all other systems.  PHYSICAL EXAM BP 162/78  Pulse 77  Ht 5\' 3"  (1.6 m)  Wt 117 lb 3.2 oz (53.162 kg)  BMI 20.77 kg/m2 GENERAL:  Well appearing for her age NECK:  No jugular venous distention, waveform within normal limits, carotid upstroke brisk and symmetric, no bruits, no thyromegaly LUNGS:  Clear to auscultation bilaterally BACK:  No CVA tenderness, mild lordosis.  CHEST:  Unremarkable HEART:  PMI not displaced or sustained,S1 and S2 within normal limits, no S3,  no clicks, no rubs, no murmurs, irregular ABD:  Flat, positive bowel sounds normal in frequency in pitch, no bruits, no rebound, no guarding, no midline pulsatile mass, no hepatomegaly, no splenomegaly EXT:  2 plus pulses throughout,  no edema, no cyanosis no clubbing   EKG:   Atrial fibrillation rate 77, axis within normal limits, first degree block, no acute ST-T wave changes.   06/29/2014   ASSESSMENT AND PLAN  DYSPNEA:  This may be related to some salt intake along with probable diastolic dysfunction and her atrial fibrillation. However, it seems to have been a minor event and she seems to be euvolemic. I will require very good blood pressure control as described below. If she has any worsening shortness of breath and like to see her back and I will likely prescribe a diuretic.  ATRIAL FIBRILLATION:  She does not notice that she is atrial fib.  For now she will continue the meds as listed.    HTN:  Her blood pressure is unusually elevated at the  doctors office but not at home.  I will have her keep a blood pressure diary. Further adjustments in her medications will be based on this.

## 2014-06-29 NOTE — Telephone Encounter (Signed)
Mr . Lenon Ahmadiurgason called stating that Ms. Konrad DoloresLester was just in to see Dr. Antoine PocheHochrein on 07/29 and she is still having some SOB and would like her to be seen as soon as possible. Please call  Thanks

## 2014-06-29 NOTE — Telephone Encounter (Signed)
RN spoke to nephew- He states patient spoke to him this morning and stated she had an episode last night. She could not get her breathe last night. She did not indicate how long to recover  He states she sat up and felt better. He is concerns that she is still out of rhythm and would like her to be seen today or soon. He states he is on his way to see her now. Patient informed nephew that she is fine this morning. RN will defer Dr Antoine PocheHochrein

## 2014-07-14 ENCOUNTER — Telehealth: Payer: Self-pay | Admitting: Cardiology

## 2014-07-14 NOTE — Telephone Encounter (Signed)
Her blood pressure average reading for 2 weeks was 160/78.The highest 181/79 and the lowest was 128/67.

## 2014-07-14 NOTE — Telephone Encounter (Signed)
Returned call to check to see if patient was doing ok. She hs fine, no cardiac complaints, just wanted to report BP readings.   Message forwarded to Dr. Ralph Dowdy to advise. Patient has a follow up appmt schedule in September.

## 2014-07-17 NOTE — Telephone Encounter (Signed)
With this BP I would add Cozaar 25 mg at night to her meds and keep a diary.  She should have a BMET in 2 weeks.

## 2014-07-19 ENCOUNTER — Other Ambulatory Visit: Payer: Self-pay | Admitting: *Deleted

## 2014-07-19 MED ORDER — LOSARTAN POTASSIUM 25 MG PO TABS: 25.0000 mg | ORAL_TABLET | Freq: Every day | ORAL | Status: DC

## 2014-07-19 NOTE — Telephone Encounter (Signed)
Pt. Informed of her added cozaar dose at hs

## 2014-07-20 ENCOUNTER — Telehealth: Payer: Self-pay | Admitting: Cardiology

## 2014-07-20 NOTE — Telephone Encounter (Signed)
Returned call to patient. Patient is concerned about adding losartan  to her evening medication dose as she states she was already taking losartan  QAM, losartan  with dinner, and losartan  before bedtime. She states she feels that adding another  to the dose she takes before bedtime may be too much for her. Patient's med list was not updated with the doses and freq listed above so there may have been some confusion r/t this medication. Informed patient that the MD is not able to tell if this medication will indeed be too much for her unless it is tried, but she wants to make sure Dr. Antoine Poche has the most updated medication list and then advise her.   She reports she tries to stay away from salt. Her BP yesterday was 158/78  Message forwarded to Dr. Ralph Dowdy

## 2014-07-20 NOTE — Telephone Encounter (Signed)
Please call,concerning the prescription that Dr Antoine Poche gave her for Losartan yesterday.

## 2014-07-20 NOTE — Telephone Encounter (Signed)
Call pharamcy to check that this is indeed how she is taking the Losartan.  I never prescribe this tid.

## 2014-07-21 ENCOUNTER — Other Ambulatory Visit: Payer: Self-pay | Admitting: *Deleted

## 2014-07-21 NOTE — Telephone Encounter (Signed)
After a lot of talking to Theresa Kennedy I finally figured out what was going on. She was only taking 25 mg Daily instead of the 50 mg tid she told us was taking . Actually she was getting her metoprolol and the cozaar mixed up. So, on the day we saw her 25 mg extra of losartan was added to a night time dose that she wasn't taking , Now we have her on 50 mg in the am and 75 mg at hs , is that what you want her to be on

## 2014-07-24 NOTE — Telephone Encounter (Signed)
Please call her back.  Much confusion on her part about her meds.  I would like the current final dose to be 50 mg of Cozaar bid.

## 2014-07-27 NOTE — Telephone Encounter (Signed)
Pt informed to take only 50 mg in am and 50 mg at hs , pt. Stated understanding

## 2014-08-10 ENCOUNTER — Ambulatory Visit (INDEPENDENT_AMBULATORY_CARE_PROVIDER_SITE_OTHER): Payer: Medicare HMO | Admitting: Cardiology

## 2014-08-10 ENCOUNTER — Encounter: Payer: Self-pay | Admitting: Cardiology

## 2014-08-10 VITALS — BP 190/79 | HR 69 | Ht 60.5 in | Wt 119.0 lb

## 2014-08-10 DIAGNOSIS — R0600 Dyspnea, unspecified: Secondary | ICD-10-CM

## 2014-08-10 DIAGNOSIS — R0989 Other specified symptoms and signs involving the circulatory and respiratory systems: Secondary | ICD-10-CM

## 2014-08-10 DIAGNOSIS — R0609 Other forms of dyspnea: Secondary | ICD-10-CM

## 2014-08-10 NOTE — Progress Notes (Signed)
HPI The patient presents for followup of atrial fib and SOB. She was added to my schedule a few days ago because she had shortness of breath acutely the night before.   There might have been some salt indiscretion the night prior to this.    However, since I saw her she has done well. The patient denies any new symptoms such as chest discomfort, neck or arm discomfort. There has been no new shortness of breath, PND or orthopnea. There have been no reported palpitations, presyncope or syncope.  Allergies  Allergen Reactions  . Sulfa Antibiotics     Current Outpatient Prescriptions  Medication Sig Dispense Refill  . amLODipine (NORVASC) 10 MG tablet Take 10 mg by mouth daily.      Marland Kitchen atorvastatin (LIPITOR) 10 MG tablet Take 10 mg by mouth daily.      . Calcium Carb-Cholecalciferol (CALCIUM 1000 + D) 1000-800 MG-UNIT TABS Take 1,000 mg by mouth 2 (two) times daily.      . Grape Seed Extract 100 MG CAPS Take by mouth.      . loratadine (CLARITIN) 10 MG tablet Take 10 mg by mouth.      . losartan (COZAAR) 25 MG tablet Take 1 tablet (25 mg total) by mouth at bedtime. Take 50 mg in AM and Take 25 mg  Extra  With 50 mg tab at night to equal 75 mg at hs  30 tablet  11  . metoprolol (LOPRESSOR) 50 MG tablet Take 50 mg by mouth 2 (two) times daily.      . Multiple Vitamin (MULTIVITAMIN WITH MINERALS) TABS Take 1 tablet by mouth daily.      . OMEGA-3 FATTY ACIDS-VITAMIN E PO Take 1,200 mg by mouth daily.      Marland Kitchen omeprazole (PRILOSEC) 40 MG capsule Take 40 mg by mouth daily.      . Rivaroxaban (XARELTO) 15 MG TABS tablet Take 1 tablet (15 mg total) by mouth daily with supper.  30 tablet  0   No current facility-administered medications for this visit.    Past Medical History  Diagnosis Date  . Hypertension   . GERD (gastroesophageal reflux disease)   . Atrial fibrillation     Past Surgical History  Procedure Laterality Date  . Abdominal hysterectomy      ROS:  As stated in the HPI and  negative for all other systems.  PHYSICAL EXAM BP 190/79  Pulse 69  Ht 5' 0.5" (1.537 m)  Wt 119 lb (53.978 kg)  BMI 22.85 kg/m2 GENERAL:  Well appearing for her age NECK:  No jugular venous distention, waveform within normal limits, carotid upstroke brisk and symmetric, no bruits, no thyromegaly LUNGS:  Clear to auscultation bilaterally BACK:  No CVA tenderness, mild lordosis.  CHEST:  Unremarkable HEART:  PMI not displaced or sustained,S1 and S2 within normal limits, no S3,  no clicks, no rubs, no murmurs, irregular ABD:  Flat, positive bowel sounds normal in frequency in pitch, no bruits, no rebound, no guarding, no midline pulsatile mass, no hepatomegaly, no splenomegaly EXT:  2 plus pulses throughout, no edema, no cyanosis no clubbing   ASSESSMENT AND PLAN  DYSPNEA:  This may have been related to some salt intake along with probable diastolic dysfunction and her atrial fibrillation. However, she seems to be euvolemic now. She will remain on the meds as listed.   ATRIAL FIBRILLATION:  She does not notice that she is atrial fib.  For now she will continue the meds  as listed.    HTN:  Her blood pressure is unusually elevated at the doctors office but not at home.   She kept a BP diary and it is in the 150s systolic but usually not higher.  In the office it came down to the 170s while she waited.

## 2014-08-10 NOTE — Patient Instructions (Signed)
Your physician recommends that you schedule a follow-up appointment in:  6 months at the [facility] office

## 2014-09-09 NOTE — Telephone Encounter (Signed)
error 

## 2014-11-21 ENCOUNTER — Emergency Department (HOSPITAL_COMMUNITY): Payer: PPO

## 2014-11-21 ENCOUNTER — Encounter (HOSPITAL_COMMUNITY): Payer: Self-pay | Admitting: Emergency Medicine

## 2014-11-21 ENCOUNTER — Emergency Department (HOSPITAL_COMMUNITY)
Admission: EM | Admit: 2014-11-21 | Discharge: 2014-11-21 | Disposition: A | Discharge status: Home / Self Care | Payer: PPO | Attending: Emergency Medicine | Admitting: Emergency Medicine

## 2014-11-21 DIAGNOSIS — K219 Gastro-esophageal reflux disease without esophagitis: Secondary | ICD-10-CM | POA: Diagnosis not present

## 2014-11-21 DIAGNOSIS — I4891 Unspecified atrial fibrillation: Secondary | ICD-10-CM | POA: Diagnosis not present

## 2014-11-21 DIAGNOSIS — H6123 Impacted cerumen, bilateral: Secondary | ICD-10-CM | POA: Diagnosis not present

## 2014-11-21 DIAGNOSIS — Z79899 Other long term (current) drug therapy: Secondary | ICD-10-CM | POA: Diagnosis not present

## 2014-11-21 DIAGNOSIS — Z8673 Personal history of transient ischemic attack (TIA), and cerebral infarction without residual deficits: Secondary | ICD-10-CM | POA: Diagnosis not present

## 2014-11-21 DIAGNOSIS — I499 Cardiac arrhythmia, unspecified: Secondary | ICD-10-CM | POA: Diagnosis not present

## 2014-11-21 DIAGNOSIS — I10 Essential (primary) hypertension: Secondary | ICD-10-CM | POA: Insufficient documentation

## 2014-11-21 DIAGNOSIS — R42 Dizziness and giddiness: Secondary | ICD-10-CM | POA: Diagnosis present

## 2014-11-21 DIAGNOSIS — H81393 Other peripheral vertigo, bilateral: Secondary | ICD-10-CM | POA: Diagnosis not present

## 2014-11-21 DIAGNOSIS — Z7901 Long term (current) use of anticoagulants: Secondary | ICD-10-CM | POA: Diagnosis not present

## 2014-11-21 HISTORY — DX: Cerebral infarction, unspecified: I63.9

## 2014-11-21 LAB — CBC
HCT: 38.5 % (ref 36.0–46.0)
HEMOGLOBIN: 13.2 g/dL (ref 12.0–15.0)
MCH: 31.8 pg (ref 26.0–34.0)
MCHC: 34.3 g/dL (ref 30.0–36.0)
MCV: 92.8 fL (ref 78.0–100.0)
PLATELETS: 201 10*3/uL (ref 150–400)
RBC: 4.15 MIL/uL (ref 3.87–5.11)
RDW: 12.9 % (ref 11.5–15.5)
WBC: 6 10*3/uL (ref 4.0–10.5)

## 2014-11-21 LAB — COMPREHENSIVE METABOLIC PANEL
ALBUMIN: 4.3 g/dL (ref 3.5–5.2)
ALK PHOS: 51 U/L (ref 39–117)
ALT: 23 U/L (ref 0–35)
AST: 34 U/L (ref 0–37)
Anion gap: 8 (ref 5–15)
BILIRUBIN TOTAL: 0.7 mg/dL (ref 0.3–1.2)
BUN: 9 mg/dL (ref 6–23)
CHLORIDE: 97 meq/L (ref 96–112)
CO2: 26 mmol/L (ref 19–32)
Calcium: 9.9 mg/dL (ref 8.4–10.5)
Creatinine, Ser: 0.87 mg/dL (ref 0.50–1.10)
GFR calc Af Amer: 65 mL/min — ABNORMAL LOW (ref >90)
GFR calc non Af Amer: 57 mL/min — ABNORMAL LOW (ref >90)
Glucose, Bld: 100 mg/dL — ABNORMAL HIGH (ref 70–99)
POTASSIUM: 4.9 mmol/L (ref 3.5–5.1)
Sodium: 131 mmol/L — ABNORMAL LOW (ref 135–145)
TOTAL PROTEIN: 7.1 g/dL (ref 6.0–8.3)

## 2014-11-21 LAB — URINALYSIS, ROUTINE W REFLEX MICROSCOPIC
Bilirubin Urine: NEGATIVE
GLUCOSE, UA: NEGATIVE mg/dL
Ketones, ur: NEGATIVE mg/dL
Nitrite: NEGATIVE
PROTEIN: NEGATIVE mg/dL
SPECIFIC GRAVITY, URINE: 1.006 (ref 1.005–1.030)
UROBILINOGEN UA: 0.2 mg/dL (ref 0.0–1.0)
pH: 7.5 (ref 5.0–8.0)

## 2014-11-21 LAB — APTT: APTT: 37 s (ref 24–37)

## 2014-11-21 LAB — I-STAT TROPONIN, ED: TROPONIN I, POC: 0 ng/mL (ref 0.00–0.08)

## 2014-11-21 LAB — DIFFERENTIAL
BASOS ABS: 0 10*3/uL (ref 0.0–0.1)
BASOS PCT: 1 % (ref 0–1)
EOS ABS: 0.1 10*3/uL (ref 0.0–0.7)
Eosinophils Relative: 1 % (ref 0–5)
Lymphocytes Relative: 31 % (ref 12–46)
Lymphs Abs: 1.9 10*3/uL (ref 0.7–4.0)
Monocytes Absolute: 0.8 10*3/uL (ref 0.1–1.0)
Monocytes Relative: 14 % — ABNORMAL HIGH (ref 3–12)
NEUTROS ABS: 3.2 10*3/uL (ref 1.7–7.7)
NEUTROS PCT: 53 % (ref 43–77)

## 2014-11-21 LAB — CBG MONITORING, ED: Glucose-Capillary: 102 mg/dL — ABNORMAL HIGH (ref 70–99)

## 2014-11-21 LAB — PROTIME-INR
INR: 1.32 (ref 0.00–1.49)
PROTHROMBIN TIME: 16.5 s — AB (ref 11.6–15.2)

## 2014-11-21 LAB — URINE MICROSCOPIC-ADD ON

## 2014-11-21 MED ORDER — CARBAMIDE PEROXIDE 6.5 % OT SOLN: 5.0000 [drp] | Freq: Two times a day (BID) | OTIC | Status: DC

## 2014-11-21 NOTE — ED Provider Notes (Signed)
CSN: [id number]     Arrival date & time 11/21/14  1136 History   First MD Initiated Contact with Patient 11/21/14 1159     Chief Complaint  Patient presents with  . Dizziness     (Consider location/radiation/quality/duration/timing/severity/associated sxs/prior Treatment) Patient is a 81 y.o. female presenting with dizziness. The history is provided by the patient.  Dizziness Quality:  Room spinning Severity:  Moderate Onset quality:  Sudden Duration:  2 days Timing:  Intermittent Progression:  Resolved Chronicity:  New Context: bending over   Context comment:  Started when she attempted to get out of bed yesterday.  has been intermittent will sometimes happen when she is walking or bending over.  not currently having symptoms. Relieved by:  Being still Exacerbated by: bending over. Ineffective treatments:  None tried Associated symptoms: no blood in stool, no chest pain, no headaches, no hearing loss, no nausea, no shortness of breath, no syncope, no vision changes, no vomiting and no weakness   Risk factors: hx of stroke   Risk factors: no new medications     Past Medical History  Diagnosis Date  . Hypertension   . GERD (gastroesophageal reflux disease)   . Atrial fibrillation   . Stroke    Past Surgical History  Procedure Laterality Date  . Abdominal hysterectomy     History reviewed. No pertinent family history. History  Substance Use Topics  . Smoking status: Never Smoker   . Smokeless tobacco: Not on file  . Alcohol Use: No   OB History    No data available     Review of Systems  HENT: Negative for hearing loss.   Respiratory: Negative for shortness of breath.   Cardiovascular: Negative for chest pain and syncope.  Gastrointestinal: Negative for nausea, vomiting and blood in stool.  Neurological: Positive for dizziness. Negative for headaches.  All other systems reviewed and are negative.     Allergies  Sulfa antibiotics  Home Medications    Prior to Admission medications   Medication Sig Start Date End Date Taking? Authorizing Provider  amLODipine (NORVASC) 10 MG tablet Take 10 mg by mouth daily.    Historical Provider, MD  atorvastatin (LIPITOR) 10 MG tablet Take 10 mg by mouth daily.    Historical Provider, MD  Calcium Carb-Cholecalciferol (CALCIUM 1000 + D) 1000-800 MG-UNIT TABS Take 1,000 mg by mouth 2 (two) times daily.    Historical Provider, MD  Grape Seed Extract 100 MG CAPS Take by mouth.    Historical Provider, MD  loratadine (CLARITIN) 10 MG tablet Take 10 mg by mouth.    Historical Provider, MD  losartan (COZAAR) 25 MG tablet Take 50 mg by mouth 2 (two) times daily. Take 50 mg two times a day 07/19/14   Rollene Rotunda, MD  metoprolol (LOPRESSOR) 50 MG tablet Take 50 mg by mouth 2 (two) times daily.    Historical Provider, MD  Multiple Vitamin (MULTIVITAMIN WITH MINERALS) TABS Take 1 tablet by mouth daily.    Historical Provider, MD  OMEGA-3 FATTY ACIDS-VITAMIN E PO Take 1,200 mg by mouth daily.    Historical Provider, MD  omeprazole (PRILOSEC) 40 MG capsule Take 40 mg by mouth daily.    Historical Provider, MD  Rivaroxaban (XARELTO) 15 MG TABS tablet Take 1 tablet (15 mg total) by mouth daily with supper. 04/22/13   Sorin Luanne Bras, MD   BP 161/56 mmHg  Pulse 57  Temp(Src) 97.6 F (36.4 C) (Oral)  Resp 18  SpO2 99% Physical Exam  Constitutional: She is oriented to person, place, and time. She appears well-developed and well-nourished. No distress.  HENT:  Head: Normocephalic and atraumatic.  Mouth/Throat: Oropharynx is clear and moist.  Eyes: Conjunctivae and EOM are normal. Pupils are equal, round, and reactive to light.  Neck: Normal range of motion. Neck supple.  Cardiovascular: Normal rate and intact distal pulses.  An irregularly irregular rhythm present.  No murmur heard. Pulmonary/Chest: Effort normal and breath sounds normal. No respiratory distress. She has no wheezes. She has no rales.  Abdominal: Soft.  She exhibits no distension. There is no tenderness. There is no rebound and no guarding.  Musculoskeletal: Normal range of motion. She exhibits no edema or tenderness.  Neurological: She is alert and oriented to person, place, and time. She has normal strength. No cranial nerve deficit or sensory deficit. Coordination and gait normal.  No nystagmus, no visual field cuts, normal heel-to-shin and finger-to-nose testing  Skin: Skin is warm and dry. No rash noted. No erythema.  Psychiatric: She has a normal mood and affect. Her behavior is normal.  Nursing note and vitals reviewed.   ED Course  Procedures (including critical care time) Labs Review Labs Reviewed  PROTIME-INR - Abnormal; Notable for the following:    Prothrombin Time 16.5 (*)    All other components within normal limits  DIFFERENTIAL - Abnormal; Notable for the following:    Monocytes Relative 14 (*)    All other components within normal limits  COMPREHENSIVE METABOLIC PANEL - Abnormal; Notable for the following:    Sodium 131 (*)    Glucose, Bld 100 (*)    GFR calc non Af Amer 57 (*)    GFR calc Af Amer 65 (*)    All other components within normal limits  URINALYSIS, ROUTINE W REFLEX MICROSCOPIC - Abnormal; Notable for the following:    Hgb urine dipstick SMALL (*)    Leukocytes, UA TRACE (*)    All other components within normal limits  CBG MONITORING, ED - Abnormal; Notable for the following:    Glucose-Capillary 102 (*)    All other components within normal limits  APTT  CBC  URINE MICROSCOPIC-ADD ON  I-STAT TROPOININ, ED    Imaging Review Ct Head (brain) Wo Contrast  11/21/2014   CLINICAL DATA:  Initial encounter for dizziness.  EXAM: CT HEAD WITHOUT CONTRAST  TECHNIQUE: Contiguous axial images were obtained from the base of the skull through the vertex without intravenous contrast.  COMPARISON:  04/20/2013  FINDINGS: There is no evidence for acute hemorrhage, hydrocephalus, mass lesion, or abnormal extra-axial  fluid collection. No definite CT evidence for acute infarction. Diffuse loss of parenchymal volume is consistent with atrophy. Patchy low attenuation in the deep hemispheric and periventricular white matter is nonspecific, but likely reflects chronic microvascular ischemic demyelination. Small focus of encephalomalacia in the posterior right frontal lobe consistent with acute infarcts seen on the previous imaging studies.  The visualized paranasal sinuses and mastoid air cells are clear.  IMPRESSION: No acute intracranial abnormality on the current exam.  Atrophy with chronic small vessel white matter ischemic demyelination and old small posterior right frontal infarct.   Electronically Signed   By: Kennith Center M.D.   On: 11/21/2014 12:28     EKG Interpretation   Date/Time:  Sunday November 21 2014 11:45:10 EST Ventricular Rate:  52 PR Interval:    QRS Duration: 86 QT Interval:  444 QTC Calculation: 412 R Axis:   -80 Text Interpretation:  Atrial fibrillation with  slow ventricular response  Left axis deviation Septal infarct , age undetermined T wave abnormality,  consider anterior ischemia or digitalis effect No significant change since  last tracing Confirmed by [facility]  MD, Alphonzo Lemmings ([id number]) on 11/21/2014  12:03:27 PM      MDM   Final diagnoses:  None    Pt with sx most consistent with peripheral vertigo.  No systemic or infectious sx.  Normal neuro exam without weakness, ataxia or cerebellar findings on exam.  Normal vision.  Currently patient has no symptoms but states it happens intermittently usually worse when she bends over.  Patient does have a prior history of stroke approximately a year and a half ago and at that time she was unable to speak. Is resolved and she is now taking Xarelto. She is still currently in A. fib.  Patient does have bilateral cerumen impaction. Prior to patient being embedded stroke protocol order set was done.   3:15 PM All imaging wnl.  Labs wnl.   Bilateral cerum impaction with extraction here.  Pt walked here after and tolerated well.  Will have pt f/u with Dr. Lysbeth Galas.  Pt and family member are comfortable with plan.     Gwyneth Sprout, MD 11/21/14 1517

## 2014-11-21 NOTE — ED Notes (Signed)
Pt c/o dizziness with hx of stroke over 1 year ago; pt sts dizziness started yesterday; pt mae and no obvious neuro deficits

## 2014-11-21 NOTE — ED Notes (Signed)
Attempted to irrigate wax out of pts right ear canal twice using peroxide and sterile water mix.  Flushed pt ears x 2 with no success. Very small particles seen in drainage. Upon addition assessment of right ear wax is still present.

## 2014-11-21 NOTE — Discharge Instructions (Signed)
Cerumen Impaction A cerumen impaction is when the wax in your ear forms a plug. This plug usually causes reduced hearing. Sometimes it also causes an earache or dizziness. Removing a cerumen impaction can be difficult and painful. The wax sticks to the ear canal. The canal is sensitive and bleeds easily. If you try to remove a heavy wax buildup with a cotton tipped swab, you may push it in further. Irrigation with water, suction, and small ear curettes may be used to clear out the wax. If the impaction is fixed to the skin in the ear canal, ear drops may be needed for a few days to loosen the wax. People who build up a lot of wax frequently can use ear wax removal products available in your local drugstore. SEEK MEDICAL CARE IF:  You develop an earache, increased hearing loss, or marked dizziness. Document Released: 12/13/2004 Document Revised: 01/28/2012 Document Reviewed: 02/02/2010 ExitCare Patient Information 2015 [employer]. This information is not intended to replace advice given to you by your health care provider. Make sure you discuss any questions you have with your health care provider. Benign Positional Vertigo Vertigo means you feel like you or your surroundings are moving when they are not. Benign positional vertigo is the most common form of vertigo. Benign means that the cause of your condition is not serious. Benign positional vertigo is more common in older adults. CAUSES  Benign positional vertigo is the result of an upset in the labyrinth system. This is an area in the middle ear that helps control your balance. This may be caused by a viral infection, head injury, or repetitive motion. However, often no specific cause is found. SYMPTOMS  Symptoms of benign positional vertigo occur when you move your head or eyes in different directions. Some of the symptoms may include:  Loss of balance and falls.  Vomiting.  Blurred vision.  Dizziness.  Nausea.  Involuntary eye  movements (nystagmus). DIAGNOSIS  Benign positional vertigo is usually diagnosed by physical exam. If the specific cause of your benign positional vertigo is unknown, your caregiver may perform imaging tests, such as magnetic resonance imaging (MRI) or computed tomography (CT). TREATMENT  Your caregiver may recommend movements or procedures to correct the benign positional vertigo. Medicines such as meclizine, benzodiazepines, and medicines for nausea may be used to treat your symptoms. In rare cases, if your symptoms are caused by certain conditions that affect the inner ear, you may need surgery. HOME CARE INSTRUCTIONS   Follow your caregiver's instructions.  Move slowly. Do not make sudden body or head movements.  Avoid driving.  Avoid operating heavy machinery.  Avoid performing any tasks that would be dangerous to you or others during a vertigo episode.  Drink enough fluids to keep your urine clear or pale yellow. SEEK IMMEDIATE MEDICAL CARE IF:   You develop problems with walking, weakness, numbness, or using your arms, hands, or legs.  You have difficulty speaking.  You develop severe headaches.  Your nausea or vomiting continues or gets worse.  You develop visual changes.  Your family or friends notice any behavioral changes.  Your condition gets worse.  You have a fever.  You develop a stiff neck or sensitivity to light. MAKE SURE YOU:   Understand these instructions.  Will watch your condition.  Will get help right away if you are not doing well or get worse. Document Released: 08/13/2006 Document Revised: 01/28/2012 Document Reviewed: 07/26/2011 Exit[employer] Patient Information 2015 [employer]. This information is not   intended to replace advice given to you by your health care provider. Make sure you discuss any questions you have with your health care provider.  

## 2015-02-11 ENCOUNTER — Encounter (HOSPITAL_COMMUNITY): Payer: Self-pay | Admitting: Emergency Medicine

## 2015-02-11 ENCOUNTER — Inpatient Hospital Stay (HOSPITAL_COMMUNITY)
Admission: EM | Admit: 2015-02-11 | Discharge: 2015-02-16 | DRG: 389 | Disposition: A | Discharge status: Home / Self Care | Payer: PPO | Attending: Internal Medicine | Admitting: Internal Medicine

## 2015-02-11 DIAGNOSIS — I10 Essential (primary) hypertension: Secondary | ICD-10-CM | POA: Diagnosis present

## 2015-02-11 DIAGNOSIS — K5900 Constipation, unspecified: Secondary | ICD-10-CM | POA: Diagnosis present

## 2015-02-11 DIAGNOSIS — Z8673 Personal history of transient ischemic attack (TIA), and cerebral infarction without residual deficits: Secondary | ICD-10-CM

## 2015-02-11 DIAGNOSIS — R109 Unspecified abdominal pain: Secondary | ICD-10-CM | POA: Diagnosis not present

## 2015-02-11 DIAGNOSIS — E871 Hypo-osmolality and hyponatremia: Secondary | ICD-10-CM

## 2015-02-11 DIAGNOSIS — K567 Ileus, unspecified: Secondary | ICD-10-CM | POA: Diagnosis not present

## 2015-02-11 DIAGNOSIS — Z882 Allergy status to sulfonamides status: Secondary | ICD-10-CM

## 2015-02-11 DIAGNOSIS — Z7901 Long term (current) use of anticoagulants: Secondary | ICD-10-CM

## 2015-02-11 DIAGNOSIS — R112 Nausea with vomiting, unspecified: Secondary | ICD-10-CM | POA: Insufficient documentation

## 2015-02-11 DIAGNOSIS — E860 Dehydration: Secondary | ICD-10-CM | POA: Diagnosis present

## 2015-02-11 DIAGNOSIS — Z79899 Other long term (current) drug therapy: Secondary | ICD-10-CM

## 2015-02-11 DIAGNOSIS — K9189 Other postprocedural complications and disorders of digestive system: Secondary | ICD-10-CM

## 2015-02-11 DIAGNOSIS — Z90710 Acquired absence of both cervix and uterus: Secondary | ICD-10-CM

## 2015-02-11 DIAGNOSIS — I4819 Other persistent atrial fibrillation: Secondary | ICD-10-CM | POA: Diagnosis present

## 2015-02-11 DIAGNOSIS — R4781 Slurred speech: Secondary | ICD-10-CM | POA: Diagnosis present

## 2015-02-11 DIAGNOSIS — K219 Gastro-esophageal reflux disease without esophagitis: Secondary | ICD-10-CM | POA: Diagnosis present

## 2015-02-11 DIAGNOSIS — K56609 Unspecified intestinal obstruction, unspecified as to partial versus complete obstruction: Secondary | ICD-10-CM

## 2015-02-11 DIAGNOSIS — I481 Persistent atrial fibrillation: Secondary | ICD-10-CM | POA: Diagnosis present

## 2015-02-11 DIAGNOSIS — E785 Hyperlipidemia, unspecified: Secondary | ICD-10-CM | POA: Diagnosis present

## 2015-02-11 LAB — CBC WITH DIFFERENTIAL/PLATELET
BASOS PCT: 0 % (ref 0–1)
Basophils Absolute: 0 10*3/uL (ref 0.0–0.1)
Eosinophils Absolute: 0 10*3/uL (ref 0.0–0.7)
Eosinophils Relative: 0 % (ref 0–5)
HCT: 39.3 % (ref 36.0–46.0)
HEMOGLOBIN: 13.7 g/dL (ref 12.0–15.0)
LYMPHS ABS: 0.8 10*3/uL (ref 0.7–4.0)
Lymphocytes Relative: 8 % — ABNORMAL LOW (ref 12–46)
MCH: 31.5 pg (ref 26.0–34.0)
MCHC: 34.9 g/dL (ref 30.0–36.0)
MCV: 90.3 fL (ref 78.0–100.0)
MONO ABS: 0.6 10*3/uL (ref 0.1–1.0)
MONOS PCT: 5 % (ref 3–12)
Neutro Abs: 9.1 10*3/uL — ABNORMAL HIGH (ref 1.7–7.7)
Neutrophils Relative %: 87 % — ABNORMAL HIGH (ref 43–77)
PLATELETS: 209 10*3/uL (ref 150–400)
RBC: 4.35 MIL/uL (ref 3.87–5.11)
RDW: 12.6 % (ref 11.5–15.5)
WBC: 10.4 10*3/uL (ref 4.0–10.5)

## 2015-02-11 LAB — I-STAT CHEM 8, ED
BUN: 22 mg/dL (ref 6–23)
CALCIUM ION: 1.19 mmol/L (ref 1.13–1.30)
Chloride: 87 mmol/L — ABNORMAL LOW (ref 96–112)
Creatinine, Ser: 0.9 mg/dL (ref 0.50–1.10)
GLUCOSE: 175 mg/dL — AB (ref 70–99)
HCT: 46 % (ref 36.0–46.0)
HEMOGLOBIN: 15.6 g/dL — AB (ref 12.0–15.0)
Potassium: 4.2 mmol/L (ref 3.5–5.1)
Sodium: 123 mmol/L — ABNORMAL LOW (ref 135–145)
TCO2: 22 mmol/L (ref 0–100)

## 2015-02-11 LAB — I-STAT CG4 LACTIC ACID, ED: Lactic Acid, Venous: 2.15 mmol/L (ref 0.5–2.0)

## 2015-02-11 MED ORDER — SODIUM CHLORIDE 0.9 % IV BOLUS (SEPSIS): 1000.0000 mL | Freq: Once | INTRAVENOUS | Status: DC

## 2015-02-11 MED ORDER — IOHEXOL 300 MG/ML  SOLN
25.0000 mL | Freq: Once | INTRAMUSCULAR | Status: AC | PRN
  Administered 2015-02-11: 25 mL via ORAL

## 2015-02-11 NOTE — ED Notes (Signed)
Per EMS: Pt was seen at [facility] this evening for nausea and vomiting and abdominal pain and was dx with a small bowel obstruction from an abdominal xray.  Pt's last BM was Wednesday 3/23.  Pt pain 8/10.  Pt sent here for further evaluation/treatment.  Pt given 4mg  of Zofran by EMS during transport.  No vomiting since her arrival at [facility].    Pt alert and oriented in room.

## 2015-02-11 NOTE — ED Notes (Signed)
CT notified pt done with contrast. 

## 2015-02-12 ENCOUNTER — Inpatient Hospital Stay (HOSPITAL_COMMUNITY): Payer: PPO

## 2015-02-12 ENCOUNTER — Encounter (HOSPITAL_COMMUNITY): Payer: Self-pay | Admitting: Radiology

## 2015-02-12 ENCOUNTER — Emergency Department (HOSPITAL_COMMUNITY): Payer: PPO

## 2015-02-12 DIAGNOSIS — Z7901 Long term (current) use of anticoagulants: Secondary | ICD-10-CM | POA: Diagnosis not present

## 2015-02-12 DIAGNOSIS — R1110 Vomiting, unspecified: Secondary | ICD-10-CM

## 2015-02-12 DIAGNOSIS — E871 Hypo-osmolality and hyponatremia: Secondary | ICD-10-CM | POA: Diagnosis present

## 2015-02-12 DIAGNOSIS — K210 Gastro-esophageal reflux disease with esophagitis: Secondary | ICD-10-CM | POA: Diagnosis not present

## 2015-02-12 DIAGNOSIS — K567 Ileus, unspecified: Secondary | ICD-10-CM | POA: Insufficient documentation

## 2015-02-12 DIAGNOSIS — Z882 Allergy status to sulfonamides status: Secondary | ICD-10-CM | POA: Diagnosis not present

## 2015-02-12 DIAGNOSIS — Z79899 Other long term (current) drug therapy: Secondary | ICD-10-CM | POA: Diagnosis not present

## 2015-02-12 DIAGNOSIS — R109 Unspecified abdominal pain: Secondary | ICD-10-CM | POA: Diagnosis present

## 2015-02-12 DIAGNOSIS — K9189 Other postprocedural complications and disorders of digestive system: Secondary | ICD-10-CM

## 2015-02-12 DIAGNOSIS — K219 Gastro-esophageal reflux disease without esophagitis: Secondary | ICD-10-CM | POA: Diagnosis present

## 2015-02-12 DIAGNOSIS — E860 Dehydration: Secondary | ICD-10-CM | POA: Diagnosis present

## 2015-02-12 DIAGNOSIS — I10 Essential (primary) hypertension: Secondary | ICD-10-CM | POA: Diagnosis not present

## 2015-02-12 DIAGNOSIS — K913 Postprocedural intestinal obstruction: Secondary | ICD-10-CM | POA: Diagnosis not present

## 2015-02-12 DIAGNOSIS — Z8673 Personal history of transient ischemic attack (TIA), and cerebral infarction without residual deficits: Secondary | ICD-10-CM | POA: Diagnosis not present

## 2015-02-12 DIAGNOSIS — K5900 Constipation, unspecified: Secondary | ICD-10-CM | POA: Diagnosis not present

## 2015-02-12 DIAGNOSIS — R112 Nausea with vomiting, unspecified: Secondary | ICD-10-CM | POA: Insufficient documentation

## 2015-02-12 DIAGNOSIS — I481 Persistent atrial fibrillation: Secondary | ICD-10-CM | POA: Diagnosis present

## 2015-02-12 DIAGNOSIS — E785 Hyperlipidemia, unspecified: Secondary | ICD-10-CM | POA: Diagnosis present

## 2015-02-12 DIAGNOSIS — Z90710 Acquired absence of both cervix and uterus: Secondary | ICD-10-CM | POA: Diagnosis not present

## 2015-02-12 LAB — LACTIC ACID, PLASMA: Lactic Acid, Venous: 1.1 mmol/L (ref 0.5–2.0)

## 2015-02-12 LAB — APTT: aPTT: 43 seconds — ABNORMAL HIGH (ref 24–37)

## 2015-02-12 LAB — COMPREHENSIVE METABOLIC PANEL
ALK PHOS: 46 U/L (ref 39–117)
ALT: 17 U/L (ref 0–35)
ANION GAP: 9 (ref 5–15)
AST: 28 U/L (ref 0–37)
Albumin: 3.5 g/dL (ref 3.5–5.2)
BILIRUBIN TOTAL: 0.9 mg/dL (ref 0.3–1.2)
BUN: 14 mg/dL (ref 6–23)
CHLORIDE: 89 mmol/L — AB (ref 96–112)
CO2: 26 mmol/L (ref 19–32)
Calcium: 8.8 mg/dL (ref 8.4–10.5)
Creatinine, Ser: 0.76 mg/dL (ref 0.50–1.10)
GFR calc Af Amer: 83 mL/min — ABNORMAL LOW (ref >90)
GFR, EST NON AFRICAN AMERICAN: 72 mL/min — AB (ref >90)
Glucose, Bld: 118 mg/dL — ABNORMAL HIGH (ref 70–99)
Potassium: 3.8 mmol/L (ref 3.5–5.1)
Sodium: 124 mmol/L — ABNORMAL LOW (ref 135–145)
TOTAL PROTEIN: 5.5 g/dL — AB (ref 6.0–8.3)

## 2015-02-12 LAB — CBC
HEMATOCRIT: 34.9 % — AB (ref 36.0–46.0)
HEMOGLOBIN: 12.2 g/dL (ref 12.0–15.0)
MCH: 31.5 pg (ref 26.0–34.0)
MCHC: 35 g/dL (ref 30.0–36.0)
MCV: 90.2 fL (ref 78.0–100.0)
PLATELETS: 189 10*3/uL (ref 150–400)
RBC: 3.87 MIL/uL (ref 3.87–5.11)
RDW: 12.7 % (ref 11.5–15.5)
WBC: 11.2 10*3/uL — ABNORMAL HIGH (ref 4.0–10.5)

## 2015-02-12 LAB — OSMOLALITY: Osmolality: 265 mOsm/kg — ABNORMAL LOW (ref 275–300)

## 2015-02-12 LAB — SODIUM, URINE, RANDOM: Sodium, Ur: 29 mmol/L

## 2015-02-12 LAB — GLUCOSE, CAPILLARY: GLUCOSE-CAPILLARY: 110 mg/dL — AB (ref 70–99)

## 2015-02-12 LAB — HEPARIN LEVEL (UNFRACTIONATED)

## 2015-02-12 LAB — CREATININE, URINE, RANDOM: Creatinine, Urine: 139.19 mg/dL

## 2015-02-12 MED ORDER — CARBAMIDE PEROXIDE 6.5 % OT SOLN
5.0000 [drp] | Freq: Two times a day (BID) | OTIC | Status: DC
  Administered 2015-02-12 – 2015-02-16 (×10): 5 [drp] via OTIC
  Filled 2015-02-12: qty 15

## 2015-02-12 MED ORDER — RIVAROXABAN 20 MG PO TABS
20.0000 mg | ORAL_TABLET | Freq: Every evening | ORAL | Status: DC
  Administered 2015-02-12: 20 mg via ORAL
  Filled 2015-02-12 (×2): qty 1

## 2015-02-12 MED ORDER — LOSARTAN POTASSIUM 50 MG PO TABS
50.0000 mg | ORAL_TABLET | Freq: Every evening | ORAL | Status: DC
  Administered 2015-02-12 – 2015-02-15 (×4): 50 mg via ORAL
  Filled 2015-02-12 (×5): qty 1

## 2015-02-12 MED ORDER — METOPROLOL TARTRATE 25 MG PO TABS
25.0000 mg | ORAL_TABLET | Freq: Two times a day (BID) | ORAL | Status: DC
  Administered 2015-02-12 – 2015-02-13 (×3): 25 mg via ORAL
  Filled 2015-02-12 (×6): qty 1

## 2015-02-12 MED ORDER — BISACODYL 5 MG PO TBEC
10.0000 mg | DELAYED_RELEASE_TABLET | Freq: Every day | ORAL | Status: DC
  Administered 2015-02-12 – 2015-02-14 (×3): 10 mg via ORAL
  Filled 2015-02-12 (×5): qty 2

## 2015-02-12 MED ORDER — ACETAMINOPHEN 650 MG RE SUPP: 650.0000 mg | Freq: Four times a day (QID) | RECTAL | Status: DC | PRN

## 2015-02-12 MED ORDER — SODIUM CHLORIDE 0.9 % IJ SOLN
3.0000 mL | Freq: Two times a day (BID) | INTRAMUSCULAR | Status: DC
  Administered 2015-02-12 – 2015-02-16 (×6): 3 mL via INTRAVENOUS

## 2015-02-12 MED ORDER — ATORVASTATIN CALCIUM 10 MG PO TABS
10.0000 mg | ORAL_TABLET | Freq: Every evening | ORAL | Status: DC
  Administered 2015-02-12 – 2015-02-15 (×4): 10 mg via ORAL
  Filled 2015-02-12 (×5): qty 1

## 2015-02-12 MED ORDER — GRAPE SEED EXTRACT 100 MG PO CAPS: 100.0000 mg | ORAL_CAPSULE | Freq: Every day | ORAL | Status: DC

## 2015-02-12 MED ORDER — CALCIUM CARBONATE-VITAMIN D 500-200 MG-UNIT PO TABS
1.0000 | ORAL_TABLET | Freq: Every day | ORAL | Status: DC
  Administered 2015-02-12 – 2015-02-16 (×5): 1 via ORAL
  Filled 2015-02-12 (×5): qty 1

## 2015-02-12 MED ORDER — METOPROLOL TARTRATE 50 MG PO TABS
50.0000 mg | ORAL_TABLET | Freq: Two times a day (BID) | ORAL | Status: DC
  Administered 2015-02-12: 50 mg via ORAL
  Filled 2015-02-12 (×3): qty 1

## 2015-02-12 MED ORDER — HEPARIN (PORCINE) IN NACL 100-0.45 UNIT/ML-% IJ SOLN
500.0000 [IU]/h | INTRAMUSCULAR | Status: DC
  Administered 2015-02-13: 700 [IU]/h via INTRAVENOUS
  Administered 2015-02-14: 400 [IU]/h via INTRAVENOUS
  Filled 2015-02-12: qty 250

## 2015-02-12 MED ORDER — MORPHINE SULFATE 2 MG/ML IJ SOLN: 1.0000 mg | INTRAMUSCULAR | Status: DC | PRN

## 2015-02-12 MED ORDER — PANTOPRAZOLE SODIUM 40 MG PO TBEC
40.0000 mg | DELAYED_RELEASE_TABLET | Freq: Every day | ORAL | Status: DC
  Administered 2015-02-12 – 2015-02-16 (×5): 40 mg via ORAL
  Filled 2015-02-12 (×4): qty 1

## 2015-02-12 MED ORDER — ONDANSETRON HCL 4 MG/2ML IJ SOLN: 4.0000 mg | Freq: Three times a day (TID) | INTRAMUSCULAR | Status: DC | PRN

## 2015-02-12 MED ORDER — SODIUM CHLORIDE 0.9 % IV SOLN
INTRAVENOUS | Status: DC
  Administered 2015-02-12: 14:00:00 via INTRAVENOUS

## 2015-02-12 MED ORDER — RIVAROXABAN 15 MG PO TABS
15.0000 mg | ORAL_TABLET | Freq: Every evening | ORAL | Status: DC
  Filled 2015-02-12: qty 1

## 2015-02-12 MED ORDER — SODIUM CHLORIDE 0.9 % IV SOLN
INTRAVENOUS | Status: DC
  Administered 2015-02-12: 04:00:00 via INTRAVENOUS

## 2015-02-12 MED ORDER — IOHEXOL 300 MG/ML  SOLN
80.0000 mL | Freq: Once | INTRAMUSCULAR | Status: AC | PRN
  Administered 2015-02-12: 80 mL via INTRAVENOUS

## 2015-02-12 MED ORDER — ADULT MULTIVITAMIN W/MINERALS CH
1.0000 | ORAL_TABLET | Freq: Every day | ORAL | Status: DC
  Administered 2015-02-12 – 2015-02-16 (×5): 1 via ORAL
  Filled 2015-02-12 (×5): qty 1

## 2015-02-12 MED ORDER — ACETAMINOPHEN 325 MG PO TABS: 650.0000 mg | ORAL_TABLET | Freq: Four times a day (QID) | ORAL | Status: DC | PRN

## 2015-02-12 MED ORDER — SODIUM CHLORIDE 0.9 % IV SOLN: INTRAVENOUS | Status: DC

## 2015-02-12 MED ORDER — AMLODIPINE BESYLATE 10 MG PO TABS
10.0000 mg | ORAL_TABLET | Freq: Every evening | ORAL | Status: DC
  Administered 2015-02-12 – 2015-02-15 (×4): 10 mg via ORAL
  Filled 2015-02-12 (×5): qty 1

## 2015-02-12 NOTE — Progress Notes (Signed)
OT Cancellation Note  Patient Details Name: Carleene CooperBessie E Bujak MRN: [id number] DOB: 12/25/1920   Cancelled Treatment:    Reason Eval/Treat Not Completed: OT screened, no needs identified, will sign off. PT notified OT that pt has no OT needs.  Earlie RavelingStraub, Raenette Sakata L OTR/L [phone] 02/12/2015, 3:40 PM

## 2015-02-12 NOTE — Progress Notes (Signed)
ANTICOAGULATION CONSULT NOTE - Follow Up Consult  Pharmacy Consult for xarelto Indication: atrial fibrillation  Allergies  Allergen Reactions  . Sulfa Antibiotics Other (See Comments)    unknown    Patient Measurements: Height: 5\' 3"  (160 cm) Weight: 122 lb 4.8 oz (55.475 kg) IBW/kg (Calculated) : 52.4 Heparin Dosing Weight:   Vital Signs: Temp: 98.4 F (36.9 C) (03/26 0519) Temp Source: Oral (03/26 0519) BP: 146/58 mmHg (03/26 0519) Pulse Rate: 70 (03/26 0519)  Labs:  Recent Labs  02/11/15 2234 02/11/15 2239 02/12/15 0315  HGB 13.7 15.6* 12.2  HCT 39.3 46.0 34.9*  PLT 209  --  189  CREATININE  --  0.90 0.76    Estimated Creatinine Clearance: 37.9 mL/min (by C-G formula based on Cr of 0.76).   Medications:  Scheduled:  . amLODipine  10 mg Oral QPM  . atorvastatin  10 mg Oral QPM  . calcium-vitamin D  1 tablet Oral Daily  . carbamide peroxide  5 drop Both Ears BID  . losartan  50 mg Oral QPM  . metoprolol  50 mg Oral BID WC  . multivitamin with minerals  1 tablet Oral Daily  . pantoprazole  40 mg Oral Daily  . rivaroxaban  15 mg Oral QPM  . sodium chloride  1,000 mL Intravenous Once  . sodium chloride  3 mL Intravenous Q12H   Infusions:  . sodium chloride 100 mL/hr at 02/12/15 [id number]    Assessment: 81 yo female with hx of afib was on xarelto 20 mg po daily, but due to having a CrCl of 38, the dose of xarelto needs to be adjusted.  Goal of Therapy:   Monitor platelets by anticoagulation protocol: Yes   Plan:  - change xarelto to 15 mg po daily, 1st dose @ 2300 tonight since a dose of given really early this morning.  - monitor for signs of bleeding  Paz Winsett, Tsz-Yin 02/12/2015,10:40 AM

## 2015-02-12 NOTE — Consult Note (Signed)
Reason for Consult: SBO  Referring Physician: Candiss Norse MD  Theresa Kennedy is an 81 y.o. female.  HPI: asked to see pt due to abdominal pain for 2 days and no BM since Wed.  Some nausea but no vomiting.  Abdomen feels distended and has diffuse crampy pain.  She is walking.  CT shows ileus vs SBO.   Past Medical History  Diagnosis Date  . Hypertension   . GERD (gastroesophageal reflux disease)   . Atrial fibrillation   . Stroke     Past Surgical History  Procedure Laterality Date  . Abdominal hysterectomy      Family History  Problem Relation Age of Onset  . Stroke Mother   . Leukemia Brother   . Breast cancer Sister     Social History:  reports that she has never smoked. She does not have any smokeless tobacco history on file. She reports that she does not drink alcohol or use illicit drugs.  Allergies:  Allergies  Allergen Reactions  . Sulfa Antibiotics Other (See Comments)    unknown    Medications: I have reviewed the patient's current medications.  Results for orders placed or performed during the hospital encounter of 02/11/15 (from the past 48 hour(s))  CBC with Differential/Platelet     Status: Abnormal   Collection Time: 02/11/15 10:34 PM  Result Value Ref Range   WBC 10.4 4.0 - 10.5 K/uL   RBC 4.35 3.87 - 5.11 MIL/uL   Hemoglobin 13.7 12.0 - 15.0 g/dL   HCT 39.3 36.0 - 46.0 %   MCV 90.3 78.0 - 100.0 fL   MCH 31.5 26.0 - 34.0 pg   MCHC 34.9 30.0 - 36.0 g/dL   RDW 12.6 11.5 - 15.5 %   Platelets 209 150 - 400 K/uL   Neutrophils Relative % 87 (H) 43 - 77 %   Neutro Abs 9.1 (H) 1.7 - 7.7 K/uL   Lymphocytes Relative 8 (L) 12 - 46 %   Lymphs Abs 0.8 0.7 - 4.0 K/uL   Monocytes Relative 5 3 - 12 %   Monocytes Absolute 0.6 0.1 - 1.0 K/uL   Eosinophils Relative 0 0 - 5 %   Eosinophils Absolute 0.0 0.0 - 0.7 K/uL   Basophils Relative 0 0 - 1 %   Basophils Absolute 0.0 0.0 - 0.1 K/uL  I-Stat CG4 Lactic Acid, ED     Status: Abnormal   Collection Time: 02/11/15  10:39 PM  Result Value Ref Range   Lactic Acid, Venous 2.15 (HH) 0.5 - 2.0 mmol/L   Comment NOTIFIED PHYSICIAN   I-stat chem 8, ed     Status: Abnormal   Collection Time: 02/11/15 10:39 PM  Result Value Ref Range   Sodium 123 (L) 135 - 145 mmol/L   Potassium 4.2 3.5 - 5.1 mmol/L   Chloride 87 (L) 96 - 112 mmol/L   BUN 22 6 - 23 mg/dL   Creatinine, Ser 0.90 0.50 - 1.10 mg/dL   Glucose, Bld 175 (H) 70 - 99 mg/dL   Calcium, Ion 1.19 1.13 - 1.30 mmol/L   TCO2 22 0 - 100 mmol/L   Hemoglobin 15.6 (H) 12.0 - 15.0 g/dL   HCT 46.0 36.0 - 46.0 %  Lactic acid, plasma     Status: None   Collection Time: 02/12/15  3:15 AM  Result Value Ref Range   Lactic Acid, Venous 1.1 0.5 - 2.0 mmol/L  Comprehensive metabolic panel     Status: Abnormal   Collection Time:  02/12/15  3:15 AM  Result Value Ref Range   Sodium 124 (L) 135 - 145 mmol/L   Potassium 3.8 3.5 - 5.1 mmol/L   Chloride 89 (L) 96 - 112 mmol/L   CO2 26 19 - 32 mmol/L   Glucose, Bld 118 (H) 70 - 99 mg/dL   BUN 14 6 - 23 mg/dL   Creatinine, Ser 0.76 0.50 - 1.10 mg/dL   Calcium 8.8 8.4 - 10.5 mg/dL   Total Protein 5.5 (L) 6.0 - 8.3 g/dL   Albumin 3.5 3.5 - 5.2 g/dL   AST 28 0 - 37 U/L   ALT 17 0 - 35 U/L   Alkaline Phosphatase 46 39 - 117 U/L   Total Bilirubin 0.9 0.3 - 1.2 mg/dL   GFR calc non Af Amer 72 (L) >90 mL/min   GFR calc Af Amer 83 (L) >90 mL/min    Comment: (NOTE) The eGFR has been calculated using the CKD EPI equation. This calculation has not been validated in all clinical situations. eGFR's persistently <90 mL/min signify possible Chronic Kidney Disease.    Anion gap 9 5 - 15  CBC     Status: Abnormal   Collection Time: 02/12/15  3:15 AM  Result Value Ref Range   WBC 11.2 (H) 4.0 - 10.5 K/uL   RBC 3.87 3.87 - 5.11 MIL/uL   Hemoglobin 12.2 12.0 - 15.0 g/dL    Comment: DELTA CHECK NOTED REPEATED TO VERIFY    HCT 34.9 (L) 36.0 - 46.0 %   MCV 90.2 78.0 - 100.0 fL   MCH 31.5 26.0 - 34.0 pg   MCHC 35.0 30.0 -  36.0 g/dL   RDW 12.7 11.5 - 15.5 %   Platelets 189 150 - 400 K/uL  Glucose, capillary     Status: Abnormal   Collection Time: 02/12/15  8:09 AM  Result Value Ref Range   Glucose-Capillary 110 (H) 70 - 99 mg/dL    Ct Abdomen Pelvis W Contrast  02/12/2015   CLINICAL DATA:  81 year old female with diffuse abdominal pain for 2 days.  EXAM: CT ABDOMEN AND PELVIS WITH CONTRAST  TECHNIQUE: Multidetector CT imaging of the abdomen and pelvis was performed using the standard protocol following bolus administration of intravenous contrast.  CONTRAST:  35mL OMNIPAQUE IOHEXOL 300 MG/ML  SOLN  COMPARISON:  Abdominal radiographs earlier this day.  FINDINGS: Evaluation of the lung bases demonstrate scattered atelectasis and mild lymphedema. The heart is mildly enlarged. Coronary artery calcifications are seen. There is a moderate hiatal hernia.  Stomach is mildly distended. Small bowel loops are diffusely distended with fluid and contrast. No definite decompressed small bowel loops are seen. Mild small-bowel enhancement without definite wall thickening. There is mild mesenteric edema and small amount of free fluid. The ileocecal valve not well seen, cecum appears high-riding. Small to moderate volume of colonic stool, the descending and sigmoid colon are decompressed. There is no evidence of pneumatosis. No definite free air.  Two cyst in the right lobe of the liver, 3.2 cm cyst in the subcapsular region, 4.1 cm cyst lower medial liver. The gallbladder is physiologically distended, no definite calcified gallstones. Common bile duct is normal measuring 5 mm. There is mild prominence of the central intrahepatic biliary tree. The spleen and adrenal glands are normal. There is mild pancreatic atrophy. Cystic L approaching of pancreatic duct measuring 7 mm in the uncinate process. There is mild irregularity and prominence of the pancreatic duct in the distal body. No surrounding peripancreatic inflammatory  change. The kidneys  demonstrate symmetric enhancement and excretion. No hydronephrosis or localizing renal abnormality.  There is dense atherosclerosis of the abdominal aorta without aneurysm.  Urinary bladder is minimally distended. The uterus is not definitively seen. Small amount of free fluid adjacent to fluid-filled bowel loops in the anterior lower pelvis.  Significant scoliotic curvature of the lumbar spine with associated severe multilevel degenerative change. No acute osseous abnormalities seen.  IMPRESSION: 1. Diffusely distended fluid-filled small bowel without definite transition point. Findings may reflect ileus, enteritis, or less likely early small bowel obstruction. Small amount of small bowel enhancement and free fluid, favoring enteritis. 2. Hepatic cysts. There is mild central intrahepatic biliary ductal dilatation, however the common bile duct is normal. The significance of this is uncertain. 3. Small cystic outpouchings from the uncinate process of the pancreas with irregularity of the pancreatic duct in the body. 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 No prior exams are available for comparison. Consider follow-up MRI or CT in 1 year. 4. Significant atherosclerosis.   Electronically Signed   By: Jeb Levering M.D.   On: 02/12/2015 01:11   Dg Abd 2 Views  02/12/2015   CLINICAL DATA:  Abdominal distention with nausea and vomiting  EXAM: ABDOMEN - 2 VIEW  COMPARISON:  CT abdomen and pelvis February 12, 2015  FINDINGS: Supine and upright images obtained. There is moderate generalized small bowel dilatation with multiple air-fluid levels. There is moderate air in the colon. Air is seen in the rectum. No free air. There is extensive arthropathy in the lumbar spine with lumbar dextroscoliosis.  IMPRESSION: The bowel gas pattern is consistent with ileus or enteritis. A degree of obstruction could present in this manner, although air is noted throughout the colon and rectum. No free air. Advanced  arthropathy in the lumbar spine with lumbar scoliosis.   Electronically Signed   By: Lowella Grip III M.D.   On: 02/12/2015 09:24    Review of Systems  Constitutional: Negative for fever and chills.  Eyes: Negative.   Respiratory: Positive for cough.   Cardiovascular: Negative.   Gastrointestinal: Positive for nausea, abdominal pain and constipation.  Musculoskeletal: Positive for myalgias.  Skin: Positive for rash.  Neurological: Positive for tremors and weakness.   Blood pressure 146/58, pulse 70, temperature 98.4 F (36.9 C), temperature source Oral, resp. rate 13, height $RemoveBe'5\' 3"'SdssuZWll$  (1.6 m), weight 55.475 kg (122 lb 4.8 oz), SpO2 95 %. Physical Exam  Constitutional: She appears well-developed and well-nourished.  HENT:  Head: Normocephalic.  Eyes: Pupils are equal, round, and reactive to light. No scleral icterus.  Neck: Normal range of motion.  Cardiovascular: Normal rate and regular rhythm.   Respiratory: Effort normal.  GI: Bowel sounds are normal. She exhibits distension. There is tenderness. There is no rebound and no guarding.  Neurological: She is alert.  Skin: Skin is warm and dry.  Psychiatric: She has a normal mood and affect. Her behavior is normal.    Assessment/Plan: Ileus less likely SBO Hyponatremia Pt has a lot of gas in the colon so less likely SBO.  Looks more like ileus pattern.   No vomiting and would hold off on NGT.  Pain tolerable and she is walking.  Would keep NPO AND WE WILL RECHECK IN AM.   Karianna Gusman A. 02/12/2015, 1:13 PM

## 2015-02-12 NOTE — Progress Notes (Signed)
Patient admitted to 5w27 from ER;  Arrived to floor via stretcher with family at bedside.  Able to ambulate to bedside with assistance.  Alert and able to state name and birthdate.  Made aware of call bell system, its use, and placed within reach.  Will continue to monitor.

## 2015-02-12 NOTE — Progress Notes (Signed)
ANTICOAGULATION CONSULT NOTE - Initial Consult  Pharmacy Consult for heparin Indication: atrial fibrillation  Allergies  Allergen Reactions  . Sulfa Antibiotics Other (See Comments)    unknown    Patient Measurements: Height: 5\' 3"  (160 cm) Weight: 122 lb 4.8 oz (55.475 kg) IBW/kg (Calculated) : 52.4 Heparin Dosing Weight: 55.5 kg  Vital Signs: Temp: 98.4 F (36.9 C) (03/26 0519) Temp Source: Oral (03/26 0519) BP: 146/58 mmHg (03/26 0519) Pulse Rate: 70 (03/26 0519)  Labs:  Recent Labs  02/11/15 2234 02/11/15 2239 02/12/15 0315  HGB 13.7 15.6* 12.2  HCT 39.3 46.0 34.9*  PLT 209  --  189  CREATININE  --  0.90 0.76    Estimated Creatinine Clearance: 37.9 mL/min (by C-G formula based on Cr of 0.76).   Medical History: Past Medical History  Diagnosis Date  . Hypertension   . GERD (gastroesophageal reflux disease)   . Atrial fibrillation   . Stroke     Medications:  Scheduled:  . amLODipine  10 mg Oral QPM  . atorvastatin  10 mg Oral QPM  . bisacodyl  10 mg Oral Daily  . calcium-vitamin D  1 tablet Oral Daily  . carbamide peroxide  5 drop Both Ears BID  . losartan  50 mg Oral QPM  . metoprolol  50 mg Oral BID WC  . multivitamin with minerals  1 tablet Oral Daily  . pantoprazole  40 mg Oral Daily  . sodium chloride  1,000 mL Intravenous Once  . sodium chloride  3 mL Intravenous Q12H   Infusions:  . sodium chloride      Assessment: 81 yo female with hx of afib will be transitioned from xarelto to heparin.  Last xarelto dose was 20 mg @ 0344 on 02/12/15.  Hgb 12.2 and Plt 189 K today.  Baseline aPTT and heparin level are 43 and >2.2, respectively.  Goal of Therapy:  aPTT 66-102 seconds 8 hr heparin level 0.3-0.7 Monitor platelets by anticoagulation protocol: Yes   Plan:  - Start heparin @ 700 units/hr @ 0400 on 02/13/15 (~24 hours after last dose of xarelto) - 8hr heparin level and aPTT after drip is started - daily heparin level, CBC and  aPTT  Avish Torry, Tsz-Yin 02/12/2015,11:38 AM

## 2015-02-12 NOTE — H&P (Addendum)
[facility] History and Physical  Theresa Kennedy:[id number] DOB: June 24, 1921 DOA: 02/11/2015  Referring physician: ED physician PCP: [name], MD  Specialists:   Chief Complaint: Abdominal pain  HPI: Theresa Kennedy is a 81 y.o. female with past medical history of hypertension, hyperlipidemia, GERD, history of stroke, atrial fibrillation on Xarelto, who presents with abdominal pain.  Patient reports that she has been having abdominal pain in the past 3 days, which has been progressively getting worse. It is associated with nausea and vomiting. She vomited twice today. No blood in the vomitus. She does not have diarrhea. Patient was evaluated in urgent care, where x-ray was done, suggesting possible small bowel obstruction. The patient was sent to emergency room for further evaluation and treatment. Patient does not have a sick contact. She still passed gas. Patient denies fever, chills, headaches, cough, chest pain, SOB, dysuria, urgency, frequency, hematuria, skin rashes or leg swelling. No unilateral weakness, numbness or tingling sensations. No vision change or hearing loss.  In ED, patient was found to have hyponatremia, WBC 10.4, and INR 2.15, temperature normal, renal function normal. CT abdomen/pelvis showed possible enteritis or ileitis, less likely to have small bowel obstruction per radiologist. Patient is admitted to inpatient for further evaluation and treatment.  Review of Systems: As presented in the history of presenting illness, rest negative.  Where does patient live? At home Can patient participate in ADLs? little  Allergy:  Allergies  Allergen Reactions  . Sulfa Antibiotics Other (See Comments)    unknown    Past Medical History  Diagnosis Date  . Hypertension   . GERD (gastroesophageal reflux disease)   . Atrial fibrillation   . Stroke     Past Surgical History  Procedure Laterality Date  . Abdominal hysterectomy      Social History:   reports that she has never smoked. She does not have any smokeless tobacco history on file. She reports that she does not drink alcohol or use illicit drugs.  Family History:  Family History  Problem Relation Age of Onset  . Stroke Mother   . Leukemia Brother   . Breast cancer Sister      Prior to Admission medications   Medication Sig Start Date End Date Taking? Authorizing Provider  amLODipine (NORVASC) 10 MG tablet Take 10 mg by mouth every evening.    Yes Historical Provider, MD  atorvastatin (LIPITOR) 10 MG tablet Take 10 mg by mouth every evening.    Yes Historical Provider, MD  Calcium Carbonate-Vitamin D 600-200 MG-UNIT TABS Take 1 tablet by mouth daily.   Yes Historical Provider, MD  Grape Seed Extract 100 MG CAPS Take 100 mg by mouth daily.    Yes Historical Provider, MD  losartan (COZAAR) 25 MG tablet Take 50 mg by mouth every evening.  07/19/14  Yes Rollene Rotunda, MD  metoprolol (LOPRESSOR) 50 MG tablet Take 50 mg by mouth 2 (two) times daily with a meal.    Yes Historical Provider, MD  omeprazole (PRILOSEC) 40 MG capsule Take 40 mg by mouth every evening.    Yes Historical Provider, MD  psyllium (METAMUCIL) 58.6 % packet Take 1 packet by mouth daily.   Yes Historical Provider, MD  XARELTO 20 MG TABS tablet Take 20 mg by mouth every evening. 11/02/14  Yes Historical Provider, MD  carbamide peroxide (DEBROX) 6.5 % otic solution Place 5 drops into both ears 2 (two) times daily. 11/21/14   Gwyneth Sprout, MD  Multiple Vitamin (MULTIVITAMIN WITH MINERALS)  TABS tablet Take 1 tablet by mouth daily.    Historical Provider, MD    Physical Exam: Filed Vitals:   02/12/15 0100 02/12/15 0130 02/12/15 0200 02/12/15 0242  BP: 128/57 132/62 136/68 135/68  Pulse: 72 73 59 127  Temp:    97.9 F (36.6 C)  TempSrc:    Oral  Resp:    16  Height:     (1.6 m)  Weight:    55.475 kg (122 lb 4.8 oz)  SpO2: 98% 98% 95% 97%   General: Not in acute distress HEENT:       Eyes: PERRL,  EOMI, no scleral icterus       ENT: No discharge from the ears and nose, no pharynx injection, no tonsillar enlargement.        Neck: No JVD, no bruit, no mass felt. Cardiac: S1/S2, irregularly irregular rhythm, No murmurs, No gallops or rubs Pulm: Good air movement bilaterally. Clear to auscultation bilaterally. No rales, wheezing, rhonchi or rubs. Abd: Soft, mildly distended, diffused tenderness, no rebound pain, no organomegaly, BS present Ext: No edema bilaterally. 2+DP/PT pulse bilaterally Musculoskeletal: No joint deformities, erythema, or stiffness, ROM full Skin: No rashes.  Neuro: Alert and oriented X3, cranial nerves II-XII grossly intact, muscle strength 5/5 in all extremeties, sensation to light touch intact.  Psych: Patient is not psychotic, no suicidal or hemocidal ideation.  Labs on Admission:  Basic Metabolic Panel:  Recent Labs Lab 02/11/15 2239  NA 123*  K 4.2  CL 87*  GLUCOSE 175*  BUN 22  CREATININE 0.90   Liver Function Tests: No results for input(s): AST, ALT, ALKPHOS, BILITOT, PROT, ALBUMIN in the last 168 hours. No results for input(s): LIPASE, AMYLASE in the last 168 hours. No results for input(s): AMMONIA in the last 168 hours. CBC:  Recent Labs Lab 02/11/15 2234 02/11/15 2239  WBC 10.4  --   NEUTROABS 9.1*  --   HGB 13.7 15.6*  HCT 39.3 46.0  MCV 90.3  --   PLT 209  --    Cardiac Enzymes: No results for input(s): CKTOTAL, CKMB, CKMBINDEX, TROPONINI in the last 168 hours.  BNP (last 3 results) No results for input(s): BNP in the last 8760 hours.  ProBNP (last 3 results) No results for input(s): PROBNP in the last 8760 hours.  CBG: No results for input(s): GLUCAP in the last 168 hours.  Radiological Exams on Admission: Ct Abdomen Pelvis W Contrast  02/12/2015   CLINICAL DATA:  81 year old female with diffuse abdominal pain for 2 days.  EXAM: CT ABDOMEN AND PELVIS WITH CONTRAST  TECHNIQUE: Multidetector CT imaging of the abdomen and  pelvis was performed using the standard protocol following bolus administration of intravenous contrast.  CONTRAST:  80mL OMNIPAQUE IOHEXOL 300 MG/ML  SOLN  COMPARISON:  Abdominal radiographs earlier this day.  FINDINGS: Evaluation of the lung bases demonstrate scattered atelectasis and mild lymphedema. The heart is mildly enlarged. Coronary artery calcifications are seen. There is a moderate hiatal hernia.  Stomach is mildly distended. Small bowel loops are diffusely distended with fluid and contrast. No definite decompressed small bowel loops are seen. Mild small-bowel enhancement without definite wall thickening. There is mild mesenteric edema and small amount of free fluid. The ileocecal valve not well seen, cecum appears high-riding. Small to moderate volume of colonic stool, the descending and sigmoid colon are decompressed. There is no evidence of pneumatosis. No definite free air.  Two cyst in the right lobe of the liver, 3.2 cm  cyst in the subcapsular region, 4.1 cm cyst lower medial liver. The gallbladder is physiologically distended, no definite calcified gallstones. Common bile duct is normal measuring 5 mm. There is mild prominence of the central intrahepatic biliary tree. The spleen and adrenal glands are normal. There is mild pancreatic atrophy. Cystic L approaching of pancreatic duct measuring 7 mm in the uncinate process. There is mild irregularity and prominence of the pancreatic duct in the distal body. No surrounding peripancreatic inflammatory change. The kidneys demonstrate symmetric enhancement and excretion. No hydronephrosis or localizing renal abnormality.  There is dense atherosclerosis of the abdominal aorta without aneurysm.  Urinary bladder is minimally distended. The uterus is not definitively seen. Small amount of free fluid adjacent to fluid-filled bowel loops in the anterior lower pelvis.  Significant scoliotic curvature of the lumbar spine with associated severe multilevel  degenerative change. No acute osseous abnormalities seen.  IMPRESSION: 1. Diffusely distended fluid-filled small bowel without definite transition point. Findings may reflect ileus, enteritis, or less likely early small bowel obstruction. Small amount of small bowel enhancement and free fluid, favoring enteritis. 2. Hepatic cysts. There is mild central intrahepatic biliary ductal dilatation, however the common bile duct is normal. The significance of this is uncertain. 3. Small cystic outpouchings from the uncinate process of the pancreas with irregularity of the pancreatic duct in the body. 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 No prior exams are available for comparison. Consider follow-up MRI or CT in 1 year. 4. Significant atherosclerosis.   Electronically Signed   By: Rubye OaksMelanie  Ehinger M.D.   On: 02/12/2015 01:11    EKG: Will get one  Assessment/Plan Principal Problem:   Abdominal pain Active Problems:   HTN (hypertension)   Atrial fibrillation, persistent   CVA (cerebral infarction)   HLD (hyperlipidemia)   GERD (gastroesophageal reflux disease)   Hyponatremia  Abdominal pain: Likely due to enteritis versus ileus, less likely small bowel obstruction as is suggested by CT abdomen/pelvis per radiologist. Currently patient is hemodynamically stable, not obviously septic.  -Admitted to telemetry bed and given history of atrial fibrillation -Symptomatic treatment: Morphine for abdominal pain and Zofran for nausea -IV fluids: Received 1 L normal saline bolus, followed by 100 mL per hour -NGT not needed now.  Atrial Fibrillation: CHA2DS2-VASc Score is 6, need oral anticoagulation. Patient is on Xarelto at home. INR is 2.15 on admission. Heart rate is well controlled. -Continue metoprolol -Continue Xarelto  Hyponatremia: Sodium 123, mental status is normal. Most likely due to decreased oral intake. -IV fluid as above -Follow-up electrolytes by  BMP.  Hypertension: -Continue amlodipine, Cozaar, metoprolol  GERD: -Protonix  Hyperlipidemia: LDL was a 11/29/12 on 04/20/13. -Continue Lipitor.   DVT ppx: on Xarelto. Code Status: Full code Family Communication:   Yes, patient's nephew   at bed side Disposition Plan: Admit to inpatient   Date of Service 02/12/2015    Lorretta HarpIU, Hope Brandenburger [facility] Pager [phone]  If 7PM-7AM, please contact night-coverage [email or web address] Password [email or web address] 02/12/2015, 3:52 AM

## 2015-02-12 NOTE — Progress Notes (Signed)
PHARMACIST - PHYSICIAN COMMUNICATION DR:    CONCERNING:  Xarelto   DESCRIPTION: Pt is on Xarelto 20mg  daily for Afib; with CrCl of ~30 ml/min (given age and size) the usual dose is 15mg  daily.  It appears from pt hx she was changed to this dose from 15mg  in the last 67mo when CrCl was similar, no updated cards notes to indicate reason for change.  If appropriate, please change Xarelto to 15mg  daily to decrease risk of bleeding.  Thank you.  Vernard GamblesVeronda Aileena Iglesia, PharmD, BCPS 02/12/2015 2:51 AM

## 2015-02-12 NOTE — Progress Notes (Signed)
Patient Demographics  Theresa Kennedy, is a 81 y.o. female, DOB - Mar 26, 1921, JYN:[id number]  Admit date - 02/11/2015   Admitting Physician Lorretta Harp, MD  Outpatient Primary MD for the patient is [name], MD  LOS - 0   Chief Complaint  Patient presents with  . Abdominal Pain        Subjective:   Theresa Kennedy today has, No headache, No chest pain, Mild generalized Abdominal pain - No Nausea, No new weakness tingling or numbness, No Cough - SOB.    Assessment & Plan    1. Abdominal pain nausea vomiting. Ileus versus early small bowel obstruction, still not passing flatus, no history suggestive of gastroenteritis. Currently nothing by mouth except medications, IV fluids, will give a Dulcolax suppository. We'll request general surgery to evaluate as well.   2. Chronic atrial fibrillation. CHADS2Vasc Score is 6 - hold xaralto, switch to IV heparin and continue Lopressor at a lower dose.   3. Hyponatremia. Likely due to dehydration, urine sodium and electrolytes ordered. Gentle hydration monitor BMP.   4. History of CVA. Continue heparin and statin for secondary prevention.   5. Dyslipidemia. On statin   6. GERD. Continue PPI   7. Essential hypertension. On Norvasc and Cozaar, beta blocker reduced as resting heart rate was less than 50.      Code Status: Full  Family Communication: None  Disposition Plan: Home   Procedures   CT Abd Pelvis  XRay   Consults  CCS   Medications  Scheduled Meds: . amLODipine  10 mg Oral QPM  . atorvastatin  10 mg Oral QPM  . bisacodyl  10 mg Oral Daily  . calcium-vitamin D  1 tablet Oral Daily  . carbamide peroxide  5 drop Both Ears BID  . losartan  50 mg Oral QPM  . metoprolol  50 mg Oral BID WC  . multivitamin with  minerals  1 tablet Oral Daily  . pantoprazole  40 mg Oral Daily  . sodium chloride  1,000 mL Intravenous Once  . sodium chloride  3 mL Intravenous Q12H   Continuous Infusions: . sodium chloride     PRN Meds:.acetaminophen **OR** acetaminophen, morphine injection, ondansetron  DVT Prophylaxis Heparin gtt  Lab Results  Component Value Date   PLT 189 02/12/2015    Antibiotics    Anti-infectives    None          Objective:   Filed Vitals:   02/12/15 0200 02/12/15 0242 02/12/15 0517 02/12/15 0519  BP: 136/68 135/68  146/58  Pulse: 59 127  70  Temp:  97.9 F (36.6 C)  98.4 F (36.9 C)  TempSrc:  Oral  Oral  Resp:  16  13  Height:   (1.6 m)    Weight:  55.475 kg (122 lb 4.8 oz) 55.475 kg (122 lb 4.8 oz)   SpO2: 95% 97%  95%    Wt Readings from Last 3 Encounters:  02/12/15 55.475 kg (122 lb 4.8 oz)  08/10/14 53.978 kg (119 lb)  06/29/14 53.162 kg (117 lb 3.2 oz)     Intake/Output Summary (Last 24 hours) at 02/12/15 1220 Last data filed at 02/12/15 0900  Gross per 24 hour  Intake  0 ml  Output      0 ml  Net      0 ml     Physical Exam  Awake Alert, Oriented X 3, No new F.N deficits, Normal affect [place].AT,PERRAL Supple Neck,No JVD, No cervical lymphadenopathy appriciated.  Symmetrical Chest wall movement, Good air movement bilaterally, CTAB RRR,No Gallops,Rubs or new Murmurs, No Parasternal Heave +ve B.Sounds, Abd Soft, No tenderness, No organomegaly appriciated, No rebound - guarding or rigidity. No Cyanosis, Clubbing or edema, No new Rash or bruise    Data Review   Micro Results No results found for this or any previous visit (from the past 240 hour(s)).  Radiology Reports Ct Abdomen Pelvis W Contrast  02/12/2015   CLINICAL DATA:  81 year old female with diffuse abdominal pain for 2 days.  EXAM: CT ABDOMEN AND PELVIS WITH CONTRAST  TECHNIQUE: Multidetector CT imaging of the abdomen and pelvis was performed using the standard protocol  following bolus administration of intravenous contrast.  CONTRAST:  80mL OMNIPAQUE IOHEXOL 300 MG/ML  SOLN  COMPARISON:  Abdominal radiographs earlier this day.  FINDINGS: Evaluation of the lung bases demonstrate scattered atelectasis and mild lymphedema. The heart is mildly enlarged. Coronary artery calcifications are seen. There is a moderate hiatal hernia.  Stomach is mildly distended. Small bowel loops are diffusely distended with fluid and contrast. No definite decompressed small bowel loops are seen. Mild small-bowel enhancement without definite wall thickening. There is mild mesenteric edema and small amount of free fluid. The ileocecal valve not well seen, cecum appears high-riding. Small to moderate volume of colonic stool, the descending and sigmoid colon are decompressed. There is no evidence of pneumatosis. No definite free air.  Two cyst in the right lobe of the liver, 3.2 cm cyst in the subcapsular region, 4.1 cm cyst lower medial liver. The gallbladder is physiologically distended, no definite calcified gallstones. Common bile duct is normal measuring 5 mm. There is mild prominence of the central intrahepatic biliary tree. The spleen and adrenal glands are normal. There is mild pancreatic atrophy. Cystic L approaching of pancreatic duct measuring 7 mm in the uncinate process. There is mild irregularity and prominence of the pancreatic duct in the distal body. No surrounding peripancreatic inflammatory change. The kidneys demonstrate symmetric enhancement and excretion. No hydronephrosis or localizing renal abnormality.  There is dense atherosclerosis of the abdominal aorta without aneurysm.  Urinary bladder is minimally distended. The uterus is not definitively seen. Small amount of free fluid adjacent to fluid-filled bowel loops in the anterior lower pelvis.  Significant scoliotic curvature of the lumbar spine with associated severe multilevel degenerative change. No acute osseous abnormalities seen.   IMPRESSION: 1. Diffusely distended fluid-filled small bowel without definite transition point. Findings may reflect ileus, enteritis, or less likely early small bowel obstruction. Small amount of small bowel enhancement and free fluid, favoring enteritis. 2. Hepatic cysts. There is mild central intrahepatic biliary ductal dilatation, however the common bile duct is normal. The significance of this is uncertain. 3. Small cystic outpouchings from the uncinate process of the pancreas with irregularity of the pancreatic duct in the body. 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 No prior exams are available for comparison. Consider follow-up MRI or CT in 1 year. 4. Significant atherosclerosis.   Electronically Signed   By: Rubye OaksMelanie  Ehinger M.D.   On: 02/12/2015 01:11   Dg Abd 2 Views  02/12/2015   CLINICAL DATA:  Abdominal distention with nausea and vomiting  EXAM: ABDOMEN - 2 VIEW  COMPARISON:  CT abdomen and pelvis February 12, 2015  FINDINGS: Supine and upright images obtained. There is moderate generalized small bowel dilatation with multiple air-fluid levels. There is moderate air in the colon. Air is seen in the rectum. No free air. There is extensive arthropathy in the lumbar spine with lumbar dextroscoliosis.  IMPRESSION: The bowel gas pattern is consistent with ileus or enteritis. A degree of obstruction could present in this manner, although air is noted throughout the colon and rectum. No free air. Advanced arthropathy in the lumbar spine with lumbar scoliosis.   Electronically Signed   By: Bretta Bang III M.D.   On: 02/12/2015 09:24     CBC  Recent Labs Lab 02/11/15 2234 02/11/15 2239 02/12/15 0315  WBC 10.4  --  11.2*  HGB 13.7 15.6* 12.2  HCT 39.3 46.0 34.9*  PLT 209  --  189  MCV 90.3  --  90.2  MCH 31.5  --  31.5  MCHC 34.9  --  35.0  RDW 12.6  --  12.7  LYMPHSABS 0.8  --   --   MONOABS 0.6  --   --   EOSABS 0.0  --   --   BASOSABS 0.0  --   --     Chemistries     Recent Labs Lab 02/11/15 2239 02/12/15 0315  NA 123* 124*  K 4.2 3.8  CL 87* 89*  CO2  --  26  GLUCOSE 175* 118*  BUN 22 14  CREATININE 0.90 0.76  CALCIUM  --  8.8  AST  --  28  ALT  --  17  ALKPHOS  --  46  BILITOT  --  0.9   ------------------------------------------------------------------------------------------------------------------ estimated creatinine clearance is 37.9 mL/min (by C-G formula based on Cr of 0.76). ------------------------------------------------------------------------------------------------------------------ No results for input(s): HGBA1C in the last 72 hours. ------------------------------------------------------------------------------------------------------------------ No results for input(s): CHOL, HDL, LDLCALC, TRIG, CHOLHDL, LDLDIRECT in the last 72 hours. ------------------------------------------------------------------------------------------------------------------ No results for input(s): TSH, T4TOTAL, T3FREE, THYROIDAB in the last 72 hours.  Invalid input(s): FREET3 ------------------------------------------------------------------------------------------------------------------ No results for input(s): VITAMINB12, FOLATE, FERRITIN, TIBC, IRON, RETICCTPCT in the last 72 hours.  Coagulation profile No results for input(s): INR, PROTIME in the last 168 hours.  No results for input(s): DDIMER in the last 72 hours.  Cardiac Enzymes No results for input(s): CKMB, TROPONINI, MYOGLOBIN in the last 168 hours.  Invalid input(s): CK ------------------------------------------------------------------------------------------------------------------ Invalid input(s): POCBNP     Time Spent in minutes  35   SINGH,PRASHANT K M.D on 02/12/2015 at 12:20 PM  Between 7am to 7pm - Pager - [phone]  After 7pm go to [email or web address] - password [facility]  [facility]   Office  [phone]

## 2015-02-12 NOTE — ED Provider Notes (Signed)
CSN: [id number]     Arrival date & time 02/11/15  2150 History   First MD Initiated Contact with Patient 02/11/15 2212     Chief Complaint  Patient presents with  . Abdominal Pain     (Consider location/radiation/quality/duration/timing/severity/associated sxs/prior Treatment) HPI Comments: Pt comes in with cc of abd pain. She has hx of hysterectomy and afib. Pt reports abd pain starting Wednesday. Pt has associated nausea and anorexia. There is emesis x 2 today. No BM since Wednesday and pt doesn't think she is passing flatus. Pt was seen at urgent care, and had an xray concerning for small bowel obstruction, thus she was sent to the ER. No uti like sx.   ROS 10 Systems reviewed and are negative for acute change except as noted in the HPI.     Patient is a 81 y.o. female presenting with abdominal pain. The history is provided by the patient.  Abdominal Pain Associated symptoms: nausea and vomiting     Past Medical History  Diagnosis Date  . Hypertension   . GERD (gastroesophageal reflux disease)   . Atrial fibrillation   . Stroke    Past Surgical History  Procedure Laterality Date  . Abdominal hysterectomy     History reviewed. No pertinent family history. History  Substance Use Topics  . Smoking status: Never Smoker   . Smokeless tobacco: Not on file  . Alcohol Use: No   OB History    No data available     Review of Systems  Gastrointestinal: Positive for nausea, vomiting and abdominal pain.      Allergies  Sulfa antibiotics  Home Medications   Prior to Admission medications   Medication Sig Start Date End Date Taking? Authorizing Provider  amLODipine (NORVASC) 10 MG tablet Take 10 mg by mouth every evening.    Yes Historical Provider, MD  atorvastatin (LIPITOR) 10 MG tablet Take 10 mg by mouth every evening.    Yes Historical Provider, MD  Calcium Carbonate-Vitamin D 600-200 MG-UNIT TABS Take 1 tablet by mouth daily.   Yes Historical Provider, MD   Grape Seed Extract 100 MG CAPS Take 100 mg by mouth daily.    Yes Historical Provider, MD  losartan (COZAAR) 25 MG tablet Take 50 mg by mouth every evening.  07/19/14  Yes Rollene RotundaJames Hochrein, MD  metoprolol (LOPRESSOR) 50 MG tablet Take 50 mg by mouth 2 (two) times daily with a meal.    Yes Historical Provider, MD  omeprazole (PRILOSEC) 40 MG capsule Take 40 mg by mouth every evening.    Yes Historical Provider, MD  psyllium (METAMUCIL) 58.6 % packet Take 1 packet by mouth daily.   Yes Historical Provider, MD  XARELTO 20 MG TABS tablet Take 20 mg by mouth every evening. 11/02/14  Yes Historical Provider, MD  carbamide peroxide (DEBROX) 6.5 % otic solution Place 5 drops into both ears 2 (two) times daily. 11/21/14   Gwyneth SproutWhitney Plunkett, MD  Multiple Vitamin (MULTIVITAMIN WITH MINERALS) TABS tablet Take 1 tablet by mouth daily.    Historical Provider, MD   BP 150/66 mmHg  Pulse 66  Temp(Src) 97.9 F (36.6 C) (Oral)  Resp 18  Ht 5\' 3"  (1.6 m)  Wt 116 lb (52.617 kg)  BMI 20.55 kg/m2  SpO2 98% Physical Exam  Constitutional: She is oriented to person, place, and time. She appears well-developed and well-nourished.  HENT:  Head: Normocephalic and atraumatic.  Eyes: EOM are normal. Pupils are equal, round, and reactive to light.  Neck:  Neck supple.  Cardiovascular: Normal rate and normal heart sounds.   Pulmonary/Chest: Effort normal. No respiratory distress.  Abdominal: Soft. She exhibits distension. There is tenderness. There is no rebound and no guarding.  Lower quadrant tenderness  Neurological: She is alert and oriented to person, place, and time.  Skin: Skin is warm and dry.  Nursing note and vitals reviewed.   ED Course  Procedures (including critical care time) Labs Review Labs Reviewed  CBC WITH DIFFERENTIAL/PLATELET - Abnormal; Notable for the following:    Neutrophils Relative % 87 (*)    Neutro Abs 9.1 (*)    Lymphocytes Relative 8 (*)    All other components within normal limits   I-STAT CG4 LACTIC ACID, ED - Abnormal; Notable for the following:    Lactic Acid, Venous 2.15 (*)    All other components within normal limits  I-STAT CHEM 8, ED - Abnormal; Notable for the following:    Sodium 123 (*)    Chloride 87 (*)    Glucose, Bld 175 (*)    Hemoglobin 15.6 (*)    All other components within normal limits    Imaging Review No results found.   EKG Interpretation None      MDM   Final diagnoses:  Abdominal pain  Hyponatremia   Pt comes in with cc of abd pain.  CT scan ordered, given concerns for SBO. Pt is also noted to be hyponatremic and has mildly elevated lactate. Pt is getting iv hydration, and irrespective of the CT scan will need admission.       Derwood Kaplan, MD 02/12/15 [phone]

## 2015-02-12 NOTE — Evaluation (Signed)
Physical Therapy Evaluation Patient Details Name: Theresa Kennedy MRN: [id number] DOB: 03/19/1921 Today's Date: 02/12/2015   History of Present Illness  pt admitted with abdominal pain with N/V, but not diarrhea.  Thought to be partial SBO, but latest working dx is enteritis.  Work up continues.  Clinical Impression  Pt at or approaching baseline functioning.  No further PT needs.  Will sign off.    Follow Up Recommendations No PT follow up    Equipment Recommendations  None recommended by PT    Recommendations for Other Services       Precautions / Restrictions Precautions Precautions: None      Mobility  Bed Mobility                  Transfers Overall transfer level: Independent                  Ambulation/Gait Ambulation/Gait assistance: Independent Ambulation Distance (Feet): 200 Feet Assistive device: None Gait Pattern/deviations: Step-through pattern Gait velocity: slower   General Gait Details: steady if a little stiff.  Stairs Stairs: Yes Stairs assistance: Modified independent (Device/Increase time) Stair Management: One rail Right Number of Stairs: 3 General stair comments: safe with rail  Wheelchair Mobility    Modified Rankin (Stroke Patients Only)       Balance Overall balance assessment: Needs assistance Sitting-balance support: No upper extremity supported Sitting balance-Leahy Scale: Normal     Standing balance support: No upper extremity supported Standing balance-Leahy Scale: Good                               Pertinent Vitals/Pain Pain Assessment: Faces Faces Pain Scale: Hurts a little bit Pain Location: abdomen Pain Descriptors / Indicators: Grimacing Pain Intervention(s): Monitored during session    Home Living Family/patient expects to be discharged to:: Private residence Living Arrangements: Alone Available Help at Discharge: Friend(s);Family;Available PRN/intermittently Type of Home:  House Home Access: Stairs to enter Entrance Stairs-Rails: Doctor, general practiceight;Left Entrance Stairs-Number of Steps: 2 Home Layout: One level Home Equipment: Grab bars - tub/shower;Grab bars - toilet      Prior Function Level of Independence: Independent         Comments: Independent with ADL's and runs her own errands.  [facility] [employer] [facility].     Hand Dominance        Extremity/Trunk Assessment   Upper Extremity Assessment: Overall WFL for tasks assessed           Lower Extremity Assessment: Overall WFL for tasks assessed         Communication   Communication: No difficulties  Cognition Arousal/Alertness: Awake/alert Behavior During Therapy: WFL for tasks assessed/performed Overall Cognitive Status: Within Functional Limits for tasks assessed                      General Comments      Exercises        Assessment/Plan    PT Assessment Patent does not need any further PT services  PT Diagnosis     PT Problem List    PT Treatment Interventions     PT Goals (Current goals can be found in the Care Plan section) Acute Rehab PT Goals PT Goal Formulation: All assessment and education complete, DC therapy    Frequency     Barriers to discharge        Co-evaluation  End of Session   Activity Tolerance: Patient tolerated treatment well Patient left: in chair;with family/visitor present Nurse Communication: Mobility status         Time: 0981-1914 PT Time Calculation (min) (ACUTE ONLY): 22 min   Charges:   PT Evaluation $Initial PT Evaluation Tier I: 1 Procedure     PT G Codes:        Capone Schwinn, Eliseo Gum 02/12/2015, 3:45 PM 02/12/2015  Lynn Bing, PT [phone]  (pager)

## 2015-02-13 ENCOUNTER — Inpatient Hospital Stay (HOSPITAL_COMMUNITY): Payer: PPO

## 2015-02-13 LAB — CBC
HCT: 38.5 % (ref 36.0–46.0)
HEMOGLOBIN: 13.1 g/dL (ref 12.0–15.0)
MCH: 31.3 pg (ref 26.0–34.0)
MCHC: 34 g/dL (ref 30.0–36.0)
MCV: 92.1 fL (ref 78.0–100.0)
Platelets: 205 10*3/uL (ref 150–400)
RBC: 4.18 MIL/uL (ref 3.87–5.11)
RDW: 13.1 % (ref 11.5–15.5)
WBC: 10.9 10*3/uL — ABNORMAL HIGH (ref 4.0–10.5)

## 2015-02-13 LAB — BASIC METABOLIC PANEL
Anion gap: 9 (ref 5–15)
BUN: 16 mg/dL (ref 6–23)
CO2: 23 mmol/L (ref 19–32)
Calcium: 9.1 mg/dL (ref 8.4–10.5)
Chloride: 96 mmol/L (ref 96–112)
Creatinine, Ser: 0.95 mg/dL (ref 0.50–1.10)
GFR calc non Af Amer: 51 mL/min — ABNORMAL LOW (ref >90)
GFR, EST AFRICAN AMERICAN: 59 mL/min — AB (ref >90)
GLUCOSE: 92 mg/dL (ref 70–99)
Potassium: 3.7 mmol/L (ref 3.5–5.1)
SODIUM: 128 mmol/L — AB (ref 135–145)

## 2015-02-13 LAB — GLUCOSE, CAPILLARY
GLUCOSE-CAPILLARY: 90 mg/dL (ref 70–99)
Glucose-Capillary: 88 mg/dL (ref 70–99)
Glucose-Capillary: 72 mg/dL (ref 70–99)

## 2015-02-13 LAB — APTT
aPTT: 162 seconds — ABNORMAL HIGH (ref 24–37)
aPTT: 151 seconds — ABNORMAL HIGH (ref 24–37)

## 2015-02-13 LAB — TSH: TSH: 1.47 u[IU]/mL (ref 0.350–4.500)

## 2015-02-13 LAB — HEPARIN LEVEL (UNFRACTIONATED)
HEPARIN UNFRACTIONATED: 2.2 [IU]/mL — AB (ref 0.30–0.70)
HEPARIN UNFRACTIONATED: 1.26 [IU]/mL — AB (ref 0.30–0.70)

## 2015-02-13 LAB — OSMOLALITY, URINE: Osmolality, Ur: 460 mOsm/kg (ref 390–1090)

## 2015-02-13 MED ORDER — SODIUM CHLORIDE 0.9 % IV SOLN
INTRAVENOUS | Status: DC
  Administered 2015-02-13: 10:00:00 via INTRAVENOUS
  Administered 2015-02-14: 1 mL via INTRAVENOUS
  Administered 2015-02-14: 1000 mL via INTRAVENOUS
  Administered 2015-02-14: 18:00:00 via INTRAVENOUS

## 2015-02-13 MED ORDER — BISACODYL 10 MG RE SUPP
10.0000 mg | Freq: Two times a day (BID) | RECTAL | Status: AC
  Administered 2015-02-13 (×2): 10 mg via RECTAL
  Filled 2015-02-13 (×2): qty 1

## 2015-02-13 NOTE — Progress Notes (Signed)
Patient Demographics  Theresa Kennedy, is a 81 y.o. female, DOB - [birth year], UEA:[id number]  Admit date - 02/11/2015   Admitting Physician Lorretta Harp, MD  Outpatient Primary MD for the patient is [name], MD  LOS - 1   Chief Complaint  Patient presents with  . Abdominal Pain        Subjective:   [name] today has, No headache, No chest pain, Mild generalized Abdominal pain - No Nausea, No new weakness tingling or numbness, No Cough - SOB.    Assessment & Plan    1. Abdominal pain nausea vomiting. Ileus versus early small bowel obstruction, still not passing flatus, no history suggestive of gastroenteritis. Currently nothing by mouth except medications, continue IV fluids, will give a Dulcolax suppository daily. Neurosurgery following. Overall pain better no nausea.   2. Chronic atrial fibrillation. CHADS2Vasc Score is 6 - hold xaralto, switch to IV heparin and continue Lopressor at a lower dose.   3. Hyponatremia. Likely due to dehydration, urine sodium and electrolytes ordered. Gentle hydration monitor BMP.   4. History of CVA. Continue heparin and statin for secondary prevention.   5. Dyslipidemia. On statin   6. GERD. Continue PPI   7. Essential hypertension. On Norvasc and Cozaar, beta blocker reduced as resting heart rate was less than 50.      Code Status: Full  Family Communication: None  Disposition Plan: Home   Procedures   CT Abd Pelvis  XRay   Consults  CCS   Medications  Scheduled Meds: . amLODipine  10 mg Oral QPM  . atorvastatin  10 mg Oral QPM  . bisacodyl  10 mg Oral Daily  . calcium-vitamin D  1 tablet Oral Daily  . carbamide peroxide  5 drop Both Ears BID  . losartan  50 mg Oral QPM  . metoprolol  25 mg Oral BID WC  .  multivitamin with minerals  1 tablet Oral Daily  . pantoprazole  40 mg Oral Daily  . sodium chloride  3 mL Intravenous Q12H   Continuous Infusions: . sodium chloride 75 mL/hr at 02/13/15 0930  . heparin 700 Units/hr (02/13/15 0319)   PRN Meds:.acetaminophen **OR** acetaminophen, morphine injection, ondansetron  DVT Prophylaxis Heparin gtt  Lab Results  Component Value Date   PLT 205 02/13/2015    Antibiotics    Anti-infectives    None          Objective:   Filed Vitals:   02/12/15 1315 02/12/15 2124 02/13/15 0524 02/13/15 0929  BP: 124/56 115/51 115/43 137/55  Pulse: 76 76 48 64  Temp: 97.3 F (36.3 C) 97.5 F (36.4 C) 98.2 F (36.8 C)   TempSrc: Oral Oral Oral   Resp: 16 18 18    Height:      Weight:      SpO2: 99% 95% 96%     Wt Readings from Last 3 Encounters:  02/12/15 55.475 kg (122 lb 4.8 oz)  08/10/14 53.978 kg (119 lb)  06/29/14 53.162 kg (117 lb 3.2 oz)     Intake/Output Summary (Last 24 hours) at 02/13/15 0945 Last data filed at 02/13/15 0911  Gross per 24 hour  Intake 256.25 ml  Output    380 ml  Net -123.75  ml     Physical Exam  Awake Alert, Oriented X 3, No new F.N deficits, Normal affect [place].AT,PERRAL Supple Neck,No JVD, No cervical lymphadenopathy appriciated.  Symmetrical Chest wall movement, Good air movement bilaterally, CTAB RRR,No Gallops,Rubs or new Murmurs, No Parasternal Heave +ve B.Sounds, Abd Soft, No tenderness, No organomegaly appriciated, No rebound - guarding or rigidity. No Cyanosis, Clubbing or edema, No new Rash or bruise    Data Review   Micro Results No results found for this or any previous visit (from the past 240 hour(s)).  Radiology Reports Dg Abd 1 View  02/13/2015   CLINICAL DATA:  Generalized abdominal pain  EXAM: ABDOMEN - 1 VIEW  COMPARISON:  02/13/2015 supine abdominal radiograph  FINDINGS: Upright abdominal radiograph demonstrates multiple differential air-fluid levels and re- demonstrates small  bowel dilatation over the left mid abdomen predominantly. Retained contrast noted within bowel over the right mid abdomen. Rightward thoracolumbar scoliosis noted. Patchy left basilar airspace opacity is reidentified. Multiple leads overlie the abdomen.  IMPRESSION: Differential air-fluid levels and small bowel dilatation most compatible with mid to distal small bowel obstruction.   Electronically Signed   By: Christiana Pellant M.D.   On: 02/13/2015 09:42   Dg Abd 1 View  02/13/2015   CLINICAL DATA:  Ileus  EXAM: ABDOMEN - 1 VIEW  COMPARISON:  02/12/2015  FINDINGS: Diffuse prominence of multiple gas-filled small and large bowel loops is reidentified. Over the left mid abdomen, there are numerous dilated loops of small bowel, largest 4.1 cm in maximal diameter. Rightward curvature of the thoracolumbar spine noted with multilevel disc degenerative change. Presence or absence of air-fluid levels or free air is suboptimally evaluated on this supine projection.  IMPRESSION: Diffuse prominence of gas-filled small and large bowel loops again noted which may indicate ileus, although there are several dilated left mid abdominal loops of small bowel which could indicate ileus although early/ partial small bowel obstruction could appear similar.   Electronically Signed   By: Christiana Pellant M.D.   On: 02/13/2015 09:20   Ct Abdomen Pelvis W Contrast  02/12/2015   CLINICAL DATA:  81 year old female with diffuse abdominal pain for 2 days.  EXAM: CT ABDOMEN AND PELVIS WITH CONTRAST  TECHNIQUE: Multidetector CT imaging of the abdomen and pelvis was performed using the standard protocol following bolus administration of intravenous contrast.  CONTRAST:  80mL OMNIPAQUE IOHEXOL 300 MG/ML  SOLN  COMPARISON:  Abdominal radiographs earlier this day.  FINDINGS: Evaluation of the lung bases demonstrate scattered atelectasis and mild lymphedema. The heart is mildly enlarged. Coronary artery calcifications are seen. There is a moderate  hiatal hernia.  Stomach is mildly distended. Small bowel loops are diffusely distended with fluid and contrast. No definite decompressed small bowel loops are seen. Mild small-bowel enhancement without definite wall thickening. There is mild mesenteric edema and small amount of free fluid. The ileocecal valve not well seen, cecum appears high-riding. Small to moderate volume of colonic stool, the descending and sigmoid colon are decompressed. There is no evidence of pneumatosis. No definite free air.  Two cyst in the right lobe of the liver, 3.2 cm cyst in the subcapsular region, 4.1 cm cyst lower medial liver. The gallbladder is physiologically distended, no definite calcified gallstones. Common bile duct is normal measuring 5 mm. There is mild prominence of the central intrahepatic biliary tree. The spleen and adrenal glands are normal. There is mild pancreatic atrophy. Cystic L approaching of pancreatic duct measuring 7 mm in the uncinate process. There  is mild irregularity and prominence of the pancreatic duct in the distal body. No surrounding peripancreatic inflammatory change. The kidneys demonstrate symmetric enhancement and excretion. No hydronephrosis or localizing renal abnormality.  There is dense atherosclerosis of the abdominal aorta without aneurysm.  Urinary bladder is minimally distended. The uterus is not definitively seen. Small amount of free fluid adjacent to fluid-filled bowel loops in the anterior lower pelvis.  Significant scoliotic curvature of the lumbar spine with associated severe multilevel degenerative change. No acute osseous abnormalities seen.  IMPRESSION: 1. Diffusely distended fluid-filled small bowel without definite transition point. Findings may reflect ileus, enteritis, or less likely early small bowel obstruction. Small amount of small bowel enhancement and free fluid, favoring enteritis. 2. Hepatic cysts. There is mild central intrahepatic biliary ductal dilatation, however  the common bile duct is normal. The significance of this is uncertain. 3. Small cystic outpouchings from the uncinate process of the pancreas with irregularity of the pancreatic duct in the body. 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 No prior exams are available for comparison. Consider follow-up MRI or CT in 1 year. 4. Significant atherosclerosis.   Electronically Signed   By: Rubye OaksMelanie  Ehinger M.D.   On: 02/12/2015 01:11   Dg Abd 2 Views  02/12/2015   CLINICAL DATA:  Abdominal distention with nausea and vomiting  EXAM: ABDOMEN - 2 VIEW  COMPARISON:  CT abdomen and pelvis February 12, 2015  FINDINGS: Supine and upright images obtained. There is moderate generalized small bowel dilatation with multiple air-fluid levels. There is moderate air in the colon. Air is seen in the rectum. No free air. There is extensive arthropathy in the lumbar spine with lumbar dextroscoliosis.  IMPRESSION: The bowel gas pattern is consistent with ileus or enteritis. A degree of obstruction could present in this manner, although air is noted throughout the colon and rectum. No free air. Advanced arthropathy in the lumbar spine with lumbar scoliosis.   Electronically Signed   By: Bretta BangWilliam  Woodruff III M.D.   On: 02/12/2015 09:24     CBC  Recent Labs Lab 02/11/15 2234 02/11/15 2239 02/12/15 0315 02/13/15 0654  WBC 10.4  --  11.2* 10.9*  HGB 13.7 15.6* 12.2 13.1  HCT 39.3 46.0 34.9* 38.5  PLT 209  --  189 205  MCV 90.3  --  90.2 92.1  MCH 31.5  --  31.5 31.3  MCHC 34.9  --  35.0 34.0  RDW 12.6  --  12.7 13.1  LYMPHSABS 0.8  --   --   --   MONOABS 0.6  --   --   --   EOSABS 0.0  --   --   --   BASOSABS 0.0  --   --   --     Chemistries   Recent Labs Lab 02/11/15 2239 02/12/15 0315 02/13/15 0654  NA 123* 124* 128*  K 4.2 3.8 3.7  CL 87* 89* 96  CO2  --  26 23  GLUCOSE 175* 118* 92  BUN 22 14 16   CREATININE 0.90 0.76 0.95  CALCIUM  --  8.8 9.1  AST  --  28  --   ALT  --  17  --   ALKPHOS   --  46  --   BILITOT  --  0.9  --    ------------------------------------------------------------------------------------------------------------------ estimated creatinine clearance is 31.9 mL/min (by C-G formula based on Cr of 0.95). ------------------------------------------------------------------------------------------------------------------ No results for input(s): HGBA1C in the last 72 hours. ------------------------------------------------------------------------------------------------------------------ No results  for input(s): CHOL, HDL, LDLCALC, TRIG, CHOLHDL, LDLDIRECT in the last 72 hours. ------------------------------------------------------------------------------------------------------------------ No results for input(s): TSH, T4TOTAL, T3FREE, THYROIDAB in the last 72 hours.  Invalid input(s): FREET3 ------------------------------------------------------------------------------------------------------------------ No results for input(s): VITAMINB12, FOLATE, FERRITIN, TIBC, IRON, RETICCTPCT in the last 72 hours.  Coagulation profile No results for input(s): INR, PROTIME in the last 168 hours.  No results for input(s): DDIMER in the last 72 hours.  Cardiac Enzymes No results for input(s): CKMB, TROPONINI, MYOGLOBIN in the last 168 hours.  Invalid input(s): CK ------------------------------------------------------------------------------------------------------------------ Invalid input(s): POCBNP     Time Spent in minutes  35   SINGH,PRASHANT K M.D on 02/13/2015 at 9:45 AM  Between 7am to 7pm - Pager - [phone]  After 7pm go to [email or web address] - password [facility]  [facility]   Office  [phone]

## 2015-02-13 NOTE — Progress Notes (Signed)
Spoke to [facility] Singh via phone whom stated to hold night shift dose of beta blocker, however night shift dose of beta blocker was already given at 1802, prior to contact with MD Thedore MinsSingh. Paged MD Claiborne Billingsallahan and spoke with him via phone, he is aware, no intervention at this time. Last BP @1802  138/58 and HR 60.

## 2015-02-13 NOTE — Progress Notes (Signed)
Subjective: Feels bloated.  No flatus or BM  Objective: Vital signs in last 24 hours: Temp:  [97.3 F (36.3 C)-98.2 F (36.8 C)] 98.2 F (36.8 C) (03/27 0524) Pulse Rate:  [48-76] 64 (03/27 0929) Resp:  [16-18] 18 (03/27 0524) BP: (115-137)/(43-56) 137/55 mmHg (03/27 0929) SpO2:  [95 %-99 %] 96 % (03/27 0524) Last BM Date: 02/09/15  Intake/Output from previous day: 03/26 0701 - 03/27 0700 In: 256.3 [I.V.:256.3] Out: 780 [Urine:780] Intake/Output this shift:    PE: General- In NAD Abdomen-soft, lower abdominal distension with no tenderness, hyperactive bowel sounds.  Lab Results:   Recent Labs  02/12/15 0315 02/13/15 0654  WBC 11.2* 10.9*  HGB 12.2 13.1  HCT 34.9* 38.5  PLT 189 205   BMET  Recent Labs  02/12/15 0315 02/13/15 0654  NA 124* 128*  K 3.8 3.7  CL 89* 96  CO2 26 23  GLUCOSE 118* 92  BUN 14 16  CREATININE 0.76 0.95  CALCIUM 8.8 9.1   PT/INR No results for input(s): LABPROT, INR in the last 72 hours. Comprehensive Metabolic Panel:    Component Value Date/Time   NA 128* 02/13/2015 0654   NA 124* 02/12/2015 0315   K 3.7 02/13/2015 0654   K 3.8 02/12/2015 0315   CL 96 02/13/2015 0654   CL 89* 02/12/2015 0315   CO2 23 02/13/2015 0654   CO2 26 02/12/2015 0315   BUN 16 02/13/2015 0654   BUN 14 02/12/2015 0315   CREATININE 0.95 02/13/2015 0654   CREATININE 0.76 02/12/2015 0315   GLUCOSE 92 02/13/2015 0654   GLUCOSE 118* 02/12/2015 0315   CALCIUM 9.1 02/13/2015 0654   CALCIUM 8.8 02/12/2015 0315   AST 28 02/12/2015 0315   AST 34 11/21/2014 1146   ALT 17 02/12/2015 0315   ALT 23 11/21/2014 1146   ALKPHOS 46 02/12/2015 0315   ALKPHOS 51 11/21/2014 1146   BILITOT 0.9 02/12/2015 0315   BILITOT 0.7 11/21/2014 1146   PROT 5.5* 02/12/2015 0315   PROT 7.1 11/21/2014 1146   ALBUMIN 3.5 02/12/2015 0315   ALBUMIN 4.3 11/21/2014 1146     Studies/Results: Dg Abd 1 View  02/13/2015   CLINICAL DATA:  Generalized abdominal pain  EXAM:  ABDOMEN - 1 VIEW  COMPARISON:  02/13/2015 supine abdominal radiograph  FINDINGS: Upright abdominal radiograph demonstrates multiple differential air-fluid levels and re- demonstrates small bowel dilatation over the left mid abdomen predominantly. Retained contrast noted within bowel over the right mid abdomen. Rightward thoracolumbar scoliosis noted. Patchy left basilar airspace opacity is reidentified. Multiple leads overlie the abdomen.  IMPRESSION: Differential air-fluid levels and small bowel dilatation most compatible with mid to distal small bowel obstruction.   Electronically Signed   By: Christiana PellantGretchen  Green M.D.   On: 02/13/2015 09:42   Dg Abd 1 View  02/13/2015   CLINICAL DATA:  Ileus  EXAM: ABDOMEN - 1 VIEW  COMPARISON:  02/12/2015  FINDINGS: Diffuse prominence of multiple gas-filled small and large bowel loops is reidentified. Over the left mid abdomen, there are numerous dilated loops of small bowel, largest 4.1 cm in maximal diameter. Rightward curvature of the thoracolumbar spine noted with multilevel disc degenerative change. Presence or absence of air-fluid levels or free air is suboptimally evaluated on this supine projection.  IMPRESSION: Diffuse prominence of gas-filled small and large bowel loops again noted which may indicate ileus, although there are several dilated left mid abdominal loops of small bowel which could indicate ileus although early/ partial small bowel obstruction  could appear similar.   Electronically Signed   By: Christiana Pellant M.D.   On: 02/13/2015 09:20   Ct Abdomen Pelvis W Contrast  02/12/2015   CLINICAL DATA:  81 year old female with diffuse abdominal pain for 2 days.  EXAM: CT ABDOMEN AND PELVIS WITH CONTRAST  TECHNIQUE: Multidetector CT imaging of the abdomen and pelvis was performed using the standard protocol following bolus administration of intravenous contrast.  CONTRAST:  80mL OMNIPAQUE IOHEXOL 300 MG/ML  SOLN  COMPARISON:  Abdominal radiographs earlier this  day.  FINDINGS: Evaluation of the lung bases demonstrate scattered atelectasis and mild lymphedema. The heart is mildly enlarged. Coronary artery calcifications are seen. There is a moderate hiatal hernia.  Stomach is mildly distended. Small bowel loops are diffusely distended with fluid and contrast. No definite decompressed small bowel loops are seen. Mild small-bowel enhancement without definite wall thickening. There is mild mesenteric edema and small amount of free fluid. The ileocecal valve not well seen, cecum appears high-riding. Small to moderate volume of colonic stool, the descending and sigmoid colon are decompressed. There is no evidence of pneumatosis. No definite free air.  Two cyst in the right lobe of the liver, 3.2 cm cyst in the subcapsular region, 4.1 cm cyst lower medial liver. The gallbladder is physiologically distended, no definite calcified gallstones. Common bile duct is normal measuring 5 mm. There is mild prominence of the central intrahepatic biliary tree. The spleen and adrenal glands are normal. There is mild pancreatic atrophy. Cystic L approaching of pancreatic duct measuring 7 mm in the uncinate process. There is mild irregularity and prominence of the pancreatic duct in the distal body. No surrounding peripancreatic inflammatory change. The kidneys demonstrate symmetric enhancement and excretion. No hydronephrosis or localizing renal abnormality.  There is dense atherosclerosis of the abdominal aorta without aneurysm.  Urinary bladder is minimally distended. The uterus is not definitively seen. Small amount of free fluid adjacent to fluid-filled bowel loops in the anterior lower pelvis.  Significant scoliotic curvature of the lumbar spine with associated severe multilevel degenerative change. No acute osseous abnormalities seen.  IMPRESSION: 1. Diffusely distended fluid-filled small bowel without definite transition point. Findings may reflect ileus, enteritis, or less likely early  small bowel obstruction. Small amount of small bowel enhancement and free fluid, favoring enteritis. 2. Hepatic cysts. There is mild central intrahepatic biliary ductal dilatation, however the common bile duct is normal. The significance of this is uncertain. 3. Small cystic outpouchings from the uncinate process of the pancreas with irregularity of the pancreatic duct in the body. 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 No prior exams are available for comparison. Consider follow-up MRI or CT in 1 year. 4. Significant atherosclerosis.   Electronically Signed   By: Rubye Oaks M.D.   On: 02/12/2015 01:11   Dg Abd 2 Views  02/12/2015   CLINICAL DATA:  Abdominal distention with nausea and vomiting  EXAM: ABDOMEN - 2 VIEW  COMPARISON:  CT abdomen and pelvis February 12, 2015  FINDINGS: Supine and upright images obtained. There is moderate generalized small bowel dilatation with multiple air-fluid levels. There is moderate air in the colon. Air is seen in the rectum. No free air. There is extensive arthropathy in the lumbar spine with lumbar dextroscoliosis.  IMPRESSION: The bowel gas pattern is consistent with ileus or enteritis. A degree of obstruction could present in this manner, although air is noted throughout the colon and rectum. No free air. Advanced arthropathy in the lumbar spine with lumbar  scoliosis.   Electronically Signed   By: Bretta Bang III M.D.   On: 02/12/2015 09:24    Anti-infectives: Anti-infectives    None      Assessment SBO vs ileus    LOS: 1 day   Plan: Dulcolax suppository.  Recheck x-rays in AM.   Theresa Kennedy J 02/13/2015

## 2015-02-13 NOTE — Progress Notes (Signed)
ANTICOAGULATION CONSULT NOTE - Follow Up Consult  Pharmacy Consult for heparin Indication: atrial fibrillation   Labs:  Recent Labs  02/11/15 2234 02/11/15 2239 02/12/15 0315 02/12/15 1245 02/13/15 0654 02/13/15 1141 02/13/15 1157 02/13/15 2240  HGB 13.7 15.6* 12.2  --  13.1  --   --   --   HCT 39.3 46.0 34.9*  --  38.5  --   --   --   PLT 209  --  189  --  205  --   --   --   APTT  --   --   --  43*  --   --  162* 151*  HEPARINUNFRC  --   --   --  >2.20*  --  2.20*  --  1.26*  CREATININE  --  0.90 0.76  --  0.95  --   --   --       Assessment: 81yo female remains supratherapeutic on heparin with very little change in PTT after rate decrease; drawn correctly per RN.  Goal of Therapy:  Heparin level 0.3-0.7 units/ml aPTT 66-102 seconds   Plan:  Will decrease heparin gtt by 4 units/kg/hr to 400 units/hr and check level/PTT in 8hr.  Vernard GamblesVeronda Gee Habig, PharmD, BCPS  02/14/2015,12:00 AM

## 2015-02-13 NOTE — Progress Notes (Signed)
ANTICOAGULATION CONSULT NOTE - Follow Up Consult  Pharmacy Consult for heparin Indication: atrial fibrillation  Allergies  Allergen Reactions  . Sulfa Antibiotics Other (See Comments)    unknown    Patient Measurements: Height: 5\' 3"  (160 cm) Weight: 122 lb 4.8 oz (55.475 kg) IBW/kg (Calculated) : 52.4 Heparin Dosing Weight: 55.5 kg  Vital Signs: Temp: 98.2 F (36.8 C) (03/27 0524) Temp Source: Oral (03/27 0524) BP: 137/55 mmHg (03/27 0929) Pulse Rate: 64 (03/27 0929)  Labs:  Recent Labs  02/11/15 2234 02/11/15 2239 02/12/15 0315 02/12/15 1245 02/13/15 0654  HGB 13.7 15.6* 12.2  --  13.1  HCT 39.3 46.0 34.9*  --  38.5  PLT 209  --  189  --  205  APTT  --   --   --  43*  --   HEPARINUNFRC  --   --   --  >2.20*  --   CREATININE  --  0.90 0.76  --  0.95    Estimated Creatinine Clearance: 31.9 mL/min (by C-G formula based on Cr of 0.95).   Medications:  Scheduled:  . amLODipine  10 mg Oral QPM  . atorvastatin  10 mg Oral QPM  . bisacodyl  10 mg Oral Daily  . calcium-vitamin D  1 tablet Oral Daily  . carbamide peroxide  5 drop Both Ears BID  . losartan  50 mg Oral QPM  . metoprolol  25 mg Oral BID WC  . multivitamin with minerals  1 tablet Oral Daily  . pantoprazole  40 mg Oral Daily  . sodium chloride  3 mL Intravenous Q12H   Infusions:  . sodium chloride 75 mL/hr at 02/13/15 0930  . heparin 700 Units/hr (02/13/15 0319)    Assessment: 81 yo female with afib is currently on supratherapeutic heparin.  Patient was on xarelto before.  Heparin level and aPTT are 2.2 and 162, respectively.  Still have effect from xarelto which is expected for few more days.  Goal of Therapy:  aPTT 66-102 seconds; heparin level 0.3-0.7 Monitor platelets by anticoagulation protocol: Yes   Plan:  - reduce heparin to 600 units/hr - 8hr heparin level  Kasie Leccese, Tsz-Yin 02/13/2015,10:42 AM

## 2015-02-13 NOTE — Progress Notes (Signed)
Patient brady down while sleeping, notified several times patient dropped into low 40's, went to check on patient and patient is sleeping.  Woke patient up and heart rate increased.  No complaints of pain from patient.  Will continue to monitor.  Macarthur CritchleyMarie Medrith Veillon, RN

## 2015-02-13 NOTE — Progress Notes (Signed)
MD Thedore MinsSingh patient ECG strip shows a-fib, SVR with 2.09 pause, just making him aware, asked to call if any intervention.

## 2015-02-14 ENCOUNTER — Inpatient Hospital Stay (HOSPITAL_COMMUNITY): Payer: PPO

## 2015-02-14 LAB — CBC
HEMATOCRIT: 34.4 % — AB (ref 36.0–46.0)
HEMOGLOBIN: 11.7 g/dL — AB (ref 12.0–15.0)
MCH: 31.5 pg (ref 26.0–34.0)
MCHC: 34 g/dL (ref 30.0–36.0)
MCV: 92.5 fL (ref 78.0–100.0)
Platelets: 192 10*3/uL (ref 150–400)
RBC: 3.72 MIL/uL — ABNORMAL LOW (ref 3.87–5.11)
RDW: 13.2 % (ref 11.5–15.5)
WBC: 8.1 10*3/uL (ref 4.0–10.5)

## 2015-02-14 LAB — APTT: aPTT: 68 seconds — ABNORMAL HIGH (ref 24–37)

## 2015-02-14 LAB — HEPARIN LEVEL (UNFRACTIONATED)
HEPARIN UNFRACTIONATED: 0.58 [IU]/mL (ref 0.30–0.70)
Heparin Unfractionated: 0.37 IU/mL (ref 0.30–0.70)

## 2015-02-14 LAB — BASIC METABOLIC PANEL
ANION GAP: 8 (ref 5–15)
BUN: 18 mg/dL (ref 6–23)
CO2: 23 mmol/L (ref 19–32)
Calcium: 8.6 mg/dL (ref 8.4–10.5)
Chloride: 102 mmol/L (ref 96–112)
Creatinine, Ser: 0.91 mg/dL (ref 0.50–1.10)
GFR calc Af Amer: 62 mL/min — ABNORMAL LOW (ref >90)
GFR calc non Af Amer: 54 mL/min — ABNORMAL LOW (ref >90)
Glucose, Bld: 65 mg/dL — ABNORMAL LOW (ref 70–99)
Potassium: 3.8 mmol/L (ref 3.5–5.1)
SODIUM: 133 mmol/L — AB (ref 135–145)

## 2015-02-14 LAB — GLUCOSE, CAPILLARY
GLUCOSE-CAPILLARY: 49 mg/dL — AB (ref 70–99)
GLUCOSE-CAPILLARY: 143 mg/dL — AB (ref 70–99)

## 2015-02-14 MED ORDER — DEXTROSE 50 % IV SOLN
INTRAVENOUS | Status: AC
  Filled 2015-02-14: qty 50

## 2015-02-14 MED ORDER — FLEET ENEMA 7-19 GM/118ML RE ENEM
1.0000 | ENEMA | Freq: Every day | RECTAL | Status: DC
  Administered 2015-02-14 – 2015-02-15 (×2): 1 via RECTAL
  Filled 2015-02-14 (×3): qty 1

## 2015-02-14 MED ORDER — DOCUSATE SODIUM 100 MG PO CAPS
200.0000 mg | ORAL_CAPSULE | Freq: Two times a day (BID) | ORAL | Status: DC
  Administered 2015-02-14 (×2): 200 mg via ORAL
  Filled 2015-02-14 (×4): qty 2

## 2015-02-14 MED ORDER — POLYETHYLENE GLYCOL 3350 17 G PO PACK
17.0000 g | PACK | Freq: Two times a day (BID) | ORAL | Status: DC
  Administered 2015-02-14 (×2): 17 g via ORAL
  Filled 2015-02-14 (×4): qty 1

## 2015-02-14 MED ORDER — DEXTROSE 50 % IV SOLN
1.0000 | Freq: Once | INTRAVENOUS | Status: AC
  Administered 2015-02-14: 50 mL via INTRAVENOUS

## 2015-02-14 NOTE — Progress Notes (Signed)
ANTICOAGULATION CONSULT NOTE - Follow Up Consult  Pharmacy Consult for Heparin Indication: atrial fibrillation  Allergies  Allergen Reactions  . Sulfa Antibiotics Other (See Comments)    unknown    Patient Measurements: Height: 5\' 3"  (160 cm) Weight: 125 lb 11.2 oz (57.017 kg) IBW/kg (Calculated) : 52.4 Heparin Dosing Weight: 57 kg  Vital Signs: Temp: 97.6 F (36.4 C) (03/28 0515) Temp Source: Oral (03/28 0515) BP: 117/38 mmHg (03/28 0830) Pulse Rate: 52 (03/28 0830)  Labs:  Recent Labs  02/12/15 0315  02/13/15 0654 02/13/15 1141 02/13/15 1157 02/13/15 2240 02/14/15 0747  HGB 12.2  --  13.1  --   --   --  11.7*  HCT 34.9*  --  38.5  --   --   --  34.4*  PLT 189  --  205  --   --   --  192  APTT  --   < >  --   --  162* 151* 68*  HEPARINUNFRC  --   < >  --  2.20*  --  1.26* 0.58  CREATININE 0.76  --  0.95  --   --   --  0.91  < > = values in this interval not displayed.  Estimated Creatinine Clearance: 33.3 mL/min (by C-G formula based on Cr of 0.91).   Assessment: 81 yo female with hx of afib to be transitioned from xarelto (last dose 3/26 AM) to heparin.Baseline aPTT and heparin level are 43 and >2.2, respectively. Patient was on xarelto 20 mg daily PTA but renal function was ~38 and initially MD said ok to reduce dose, but now want to switch to heparin for poss. abdominal surgery.   Anticoag: Afib on Xarelto PTA. APTT 68 in goal. HL 0.58 also in goal. Hgb down to 11.7. Plts 192 relatively stable.  Card: hx of HTN, HLD and afib. BP 117/38, HR 52.On norvasc10, Lipitor10, losartan, lopressor.   Neuro: hx of stroke on Xarelto PTA  Endo/GI: hx of GERD on protonix. Abdominal pain. CT shows ileus vs SBO. More likely ileus. Surgery is observing. N/V better.  Distended but no pain.Try Dulcolax and Miralax.  Hem/Onc: see AC  Renal: SCr 0.91 (CrCl ~33)  Best practice: xarelto > heparin, PPI  Home meds: resumed  Goal of Therapy:  aPTT 66-102  seconds Monitor platelets by anticoagulation protocol: Yes   Plan:  Continue Iv heparin at 400 units/hr Recheck heparin level in 6 hrs to confirm.   Markeith Jue S. Merilynn Finlandobertson, PharmD, BCPS Clinical Staff Pharmacist Pager [phone]  Misty Stanleyobertson, Callahan Peddie Stillinger 02/14/2015,9:23 AM

## 2015-02-14 NOTE — Progress Notes (Signed)
[facility] Progress Note     Subjective: Abdominal pain resolved, still feels distended, no N/V, hungry.  Ambulating some.  Having good flatus and had 2 BM's.    Objective: Vital signs in last 24 hours: Temp:  [97.6 F (36.4 C)-97.8 F (36.6 C)] 97.6 F (36.4 C) (03/28 0515) Pulse Rate:  [52-83] 52 (03/28 0830) Resp:  [16-17] 17 (03/28 0515) BP: (114-137)/(38-58) 117/38 mmHg (03/28 0830) SpO2:  [86 %-100 %] 100 % (03/28 0515) Weight:  [57.017 kg (125 lb 11.2 oz)] 57.017 kg (125 lb 11.2 oz) (03/28 0404) Last BM Date: 02/13/15  Intake/Output from previous day: 03/27 0701 - 03/28 0700 In: 1539.4 [I.V.:1539.4] Out: -  Intake/Output this shift: Total I/O In: 0  Out: 800 [Urine:800]  PE: Gen:  Alert, NAD, pleasant Abd: Soft, mild distension, NT, +BS, no HSM   Lab Results:   Recent Labs  02/13/15 0654 02/14/15 0747  WBC 10.9* 8.1  HGB 13.1 11.7*  HCT 38.5 34.4*  PLT 205 192   BMET  Recent Labs  02/13/15 0654 02/14/15 0747  NA 128* 133*  K 3.7 3.8  CL 96 102  CO2 23 23  GLUCOSE 92 65*  BUN 16 18  CREATININE 0.95 0.91  CALCIUM 9.1 8.6   PT/INR No results for input(s): LABPROT, INR in the last 72 hours. CMP     Component Value Date/Time   NA 133* 02/14/2015 0747   K 3.8 02/14/2015 0747   CL 102 02/14/2015 0747   CO2 23 02/14/2015 0747   GLUCOSE 65* 02/14/2015 0747   BUN 18 02/14/2015 0747   CREATININE 0.91 02/14/2015 0747   CALCIUM 8.6 02/14/2015 0747   PROT 5.5* 02/12/2015 0315   ALBUMIN 3.5 02/12/2015 0315   AST 28 02/12/2015 0315   ALT 17 02/12/2015 0315   ALKPHOS 46 02/12/2015 0315   BILITOT 0.9 02/12/2015 0315   GFRNONAA 54* 02/14/2015 0747   GFRAA 62* 02/14/2015 0747   Lipase  No results found for: LIPASE     Studies/Results: Dg Abd 1 View  02/13/2015   CLINICAL DATA:  Generalized abdominal pain  EXAM: ABDOMEN - 1 VIEW  COMPARISON:  02/13/2015 supine abdominal radiograph  FINDINGS: Upright abdominal radiograph  demonstrates multiple differential air-fluid levels and re- demonstrates small bowel dilatation over the left mid abdomen predominantly. Retained contrast noted within bowel over the right mid abdomen. Rightward thoracolumbar scoliosis noted. Patchy left basilar airspace opacity is reidentified. Multiple leads overlie the abdomen.  IMPRESSION: Differential air-fluid levels and small bowel dilatation most compatible with mid to distal small bowel obstruction.   Electronically Signed   By: Christiana Pellant M.D.   On: 02/13/2015 09:42   Dg Abd 1 View  02/13/2015   CLINICAL DATA:  Ileus  EXAM: ABDOMEN - 1 VIEW  COMPARISON:  02/12/2015  FINDINGS: Diffuse prominence of multiple gas-filled small and large bowel loops is reidentified. Over the left mid abdomen, there are numerous dilated loops of small bowel, largest 4.1 cm in maximal diameter. Rightward curvature of the thoracolumbar spine noted with multilevel disc degenerative change. Presence or absence of air-fluid levels or free air is suboptimally evaluated on this supine projection.  IMPRESSION: Diffuse prominence of gas-filled small and large bowel loops again noted which may indicate ileus, although there are several dilated left mid abdominal loops of small bowel which could indicate ileus although early/ partial small bowel obstruction could appear similar.   Electronically Signed   By: Christiana Pellant M.D.   On:  02/13/2015 09:20   Dg Abd 2 Views  02/14/2015   CLINICAL DATA:  Abdominal pain and distention, ileus.  EXAM: ABDOMEN - 2 VIEW  COMPARISON:  02/13/2015  FINDINGS: Previously provided oral contrast has progressed into the upper descending colon. Small bowel fluid levels persists, although there is less distention than at admission. No concerning intra-abdominal mass effect. No pneumoperitoneum. There is unchanged streaky opacity at the peripheral left base, likely atelectasis.  IMPRESSION: Oral contrast has progressed into the descending colon. Small  bowel distention persists but is gradually improving.   Electronically Signed   By: Marnee SpringJonathon  Watts M.D.   On: 02/14/2015 07:12   Dg Abd 2 Views  02/12/2015   CLINICAL DATA:  Abdominal distention with nausea and vomiting  EXAM: ABDOMEN - 2 VIEW  COMPARISON:  CT abdomen and pelvis February 12, 2015  FINDINGS: Supine and upright images obtained. There is moderate generalized small bowel dilatation with multiple air-fluid levels. There is moderate air in the colon. Air is seen in the rectum. No free air. There is extensive arthropathy in the lumbar spine with lumbar dextroscoliosis.  IMPRESSION: The bowel gas pattern is consistent with ileus or enteritis. A degree of obstruction could present in this manner, although air is noted throughout the colon and rectum. No free air. Advanced arthropathy in the lumbar spine with lumbar scoliosis.   Electronically Signed   By: Bretta BangWilliam  Woodruff III M.D.   On: 02/12/2015 09:24    Anti-infectives: Anti-infectives    None       Assessment/Plan Abdominal pain/distension Constipation  -Contrast in colon on this mornings xray, no evidence of SBO, having BM's -Suppositories, enema -Start clears and see how she does, advance slowly -Ambulating and IS -SCD's and lovenox   Leukocytosis - resolved    LOS: 2 days    DORT, Theresa Kennedy 02/14/2015, 9:06 AM Pager: [phone]

## 2015-02-14 NOTE — Progress Notes (Signed)
Hypoglycemic Event  CBG: 49  Treatment: 15 GM carbohydrate snack and D50 IV 50 mL  Symptoms: None  Follow-up CBG: Time:1019 CBG Result:143  Possible Reasons for Event: Inadequate meal intake  Comments/MD notified:Dr. Rolan BuccoSingh    Theresa Kennedy  Remember to initiate Hypoglycemia Order Set & complete

## 2015-02-14 NOTE — Progress Notes (Signed)
ANTICOAGULATION CONSULT NOTE - Follow Up Consult  Pharmacy Consult for Heparin Indication: atrial fibrillation  Allergies  Allergen Reactions  . Sulfa Antibiotics Other (See Comments)    unknown    Patient Measurements: Height: 5\' 3"  (160 cm) Weight: 125 lb 11.2 oz (57.017 kg) IBW/kg (Calculated) : 52.4 Heparin Dosing Weight: 57 kg  Vital Signs: Temp: 97.6 F (36.4 C) (03/28 0515) Temp Source: Oral (03/28 0515) BP: 117/38 mmHg (03/28 0830) Pulse Rate: 52 (03/28 0830)  Labs:  Recent Labs  02/12/15 0315  02/13/15 0654  02/13/15 1157 02/13/15 2240 02/14/15 0747 02/14/15 1439  HGB 12.2  --  13.1  --   --   --  11.7*  --   HCT 34.9*  --  38.5  --   --   --  34.4*  --   PLT 189  --  205  --   --   --  192  --   APTT  --   < >  --   --  162* 151* 68*  --   HEPARINUNFRC  --   < >  --   < >  --  1.26* 0.58 0.37  CREATININE 0.76  --  0.95  --   --   --  0.91  --   < > = values in this interval not displayed.  Estimated Creatinine Clearance: 33.3 mL/min (by C-G formula based on Cr of 0.91).   Assessment: 81 yo female with hx of afib - pt on Xarelto PTA. No briding with heparin for possible abdominal surgery. Heparin level therapeutic (0.37) on 400 units/hr. No bleeding noted. CHADS2Vasc Score is 6   Goal of Therapy:  Heparin level 0.3-0.7 units/ml Monitor platelets by anticoagulation protocol: Yes   Plan:  Continue heparin at 400 units/hr Daily heparin level and CBC  Christoper Fabianaron Jontavius Rabalais, PharmD, BCPS Clinical pharmacist, pager [phone] 02/14/2015,5:05 PM

## 2015-02-14 NOTE — Progress Notes (Signed)
Patient Demographics  Theresa Kennedy, is a 81 y.o. female, DOB - 12/09/1920, XLK:[id number]  Admit date - 02/11/2015   Admitting Physician Lorretta HarpXilin Niu, MD  Outpatient Primary MD for the patient is [name],[name], MD  LOS - 2   Chief Complaint  Patient presents with  . Abdominal Pain        Subjective:   Theresa Kennedy today has, No headache, No chest pain, Mild generalized Abdominal pain - No Nausea, No new weakness tingling or numbness, No Cough - SOB.    Assessment & Plan    1. Abdominal pain nausea vomiting. Ileus versus early small bowel obstruction, still not passing flatus, no history suggestive of gastroenteritis. Currently nothing by mouth except medications, continue IV fluids, will give a Dulcolax suppository daily. Surgery following. Overall pain better no nausea.   2. Chronic atrial fibrillation. CHADS2Vasc Score is 6 - hold xaralto, switched to IV heparin and continue Lopressor at a lower dose.   3. Hyponatremia. Likely due to dehydration, urine sodium and electrolytes ordered. Improved with hydration monitor BMP.   4. History of CVA. Continue heparin and statin for secondary prevention.   5. Dyslipidemia. On statin   6. GERD. Continue PPI   7. Essential hypertension. On Norvasc and Cozaar, beta blocker held as resting heart rate was less than 45.      Code Status: Full  Family Communication: None  Disposition Plan: Home   Procedures   CT Abd Pelvis  XRay   Consults  CCS   Medications  Scheduled Meds: . amLODipine  10 mg Oral QPM  . atorvastatin  10 mg Oral QPM  . bisacodyl  10 mg Oral Daily  . calcium-vitamin D  1 tablet Oral Daily  . carbamide peroxide  5 drop Both Ears BID  . dextrose      . docusate sodium  200 mg Oral BID  . losartan   50 mg Oral QPM  . multivitamin with minerals  1 tablet Oral Daily  . pantoprazole  40 mg Oral Daily  . polyethylene glycol  17 g Oral BID  . sodium chloride  3 mL Intravenous Q12H  . sodium phosphate  1 enema Rectal Daily   Continuous Infusions: . sodium chloride 1 mL (02/14/15 0359)  . heparin 400 Units/hr (02/14/15 0015)   PRN Meds:.acetaminophen **OR** acetaminophen, morphine injection, ondansetron  DVT Prophylaxis Heparin gtt  Lab Results  Component Value Date   PLT 192 02/14/2015    Antibiotics    Anti-infectives    None          Objective:   Filed Vitals:   02/13/15 2126 02/14/15 0404 02/14/15 0515 02/14/15 0830  BP: 117/58  114/44 117/38  Pulse:   83 52  Temp: 97.8 F (36.6 C)  97.6 F (36.4 C)   TempSrc: Oral  Oral   Resp: 16  17   Height:      Weight:  57.017 kg (125 lb 11.2 oz)    SpO2: 86%  100%     Wt Readings from Last 3 Encounters:  02/14/15 57.017 kg (125 lb 11.2 oz)  08/10/14 53.978 kg (119 lb)  06/29/14 53.162 kg (117 lb 3.2 oz)     Intake/Output Summary (Last 24 hours) at  02/14/15 1035 Last data filed at 02/14/15 0900  Gross per 24 hour  Intake 1983.4 ml  Output    800 ml  Net 1183.4 ml     Physical Exam  Awake Alert, Oriented X 3, No new F.N deficits, Normal affect [place].AT,PERRAL Supple Neck,No JVD, No cervical lymphadenopathy appriciated.  Symmetrical Chest wall movement, Good air movement bilaterally, CTAB RRR,No Gallops,Rubs or new Murmurs, No Parasternal Heave +ve B.Sounds, Abd Soft, No tenderness, No organomegaly appriciated, No rebound - guarding or rigidity. No Cyanosis, Clubbing or edema, No new Rash or bruise     Data Review   Micro Results No results found for this or any previous visit (from the past 240 hour(s)).  Radiology Reports Dg Abd 1 View  02/13/2015   CLINICAL DATA:  Generalized abdominal pain  EXAM: ABDOMEN - 1 VIEW  COMPARISON:  02/13/2015 supine abdominal radiograph  FINDINGS: Upright abdominal  radiograph demonstrates multiple differential air-fluid levels and re- demonstrates small bowel dilatation over the left mid abdomen predominantly. Retained contrast noted within bowel over the right mid abdomen. Rightward thoracolumbar scoliosis noted. Patchy left basilar airspace opacity is reidentified. Multiple leads overlie the abdomen.  IMPRESSION: Differential air-fluid levels and small bowel dilatation most compatible with mid to distal small bowel obstruction.   Electronically Signed   By: Christiana Pellant M.D.   On: 02/13/2015 09:42   Dg Abd 1 View  02/13/2015   CLINICAL DATA:  Ileus  EXAM: ABDOMEN - 1 VIEW  COMPARISON:  02/12/2015  FINDINGS: Diffuse prominence of multiple gas-filled small and large bowel loops is reidentified. Over the left mid abdomen, there are numerous dilated loops of small bowel, largest 4.1 cm in maximal diameter. Rightward curvature of the thoracolumbar spine noted with multilevel disc degenerative change. Presence or absence of air-fluid levels or free air is suboptimally evaluated on this supine projection.  IMPRESSION: Diffuse prominence of gas-filled small and large bowel loops again noted which may indicate ileus, although there are several dilated left mid abdominal loops of small bowel which could indicate ileus although early/ partial small bowel obstruction could appear similar.   Electronically Signed   By: Christiana Pellant M.D.   On: 02/13/2015 09:20   Ct Abdomen Pelvis W Contrast  02/12/2015   CLINICAL DATA:  81 year old female with diffuse abdominal pain for 2 days.  EXAM: CT ABDOMEN AND PELVIS WITH CONTRAST  TECHNIQUE: Multidetector CT imaging of the abdomen and pelvis was performed using the standard protocol following bolus administration of intravenous contrast.  CONTRAST:  80mL OMNIPAQUE IOHEXOL 300 MG/ML  SOLN  COMPARISON:  Abdominal radiographs earlier this day.  FINDINGS: Evaluation of the lung bases demonstrate scattered atelectasis and mild lymphedema.  The heart is mildly enlarged. Coronary artery calcifications are seen. There is a moderate hiatal hernia.  Stomach is mildly distended. Small bowel loops are diffusely distended with fluid and contrast. No definite decompressed small bowel loops are seen. Mild small-bowel enhancement without definite wall thickening. There is mild mesenteric edema and small amount of free fluid. The ileocecal valve not well seen, cecum appears high-riding. Small to moderate volume of colonic stool, the descending and sigmoid colon are decompressed. There is no evidence of pneumatosis. No definite free air.  Two cyst in the right lobe of the liver, 3.2 cm cyst in the subcapsular region, 4.1 cm cyst lower medial liver. The gallbladder is physiologically distended, no definite calcified gallstones. Common bile duct is normal measuring 5 mm. There is mild prominence of the central intrahepatic  biliary tree. The spleen and adrenal glands are normal. There is mild pancreatic atrophy. Cystic L approaching of pancreatic duct measuring 7 mm in the uncinate process. There is mild irregularity and prominence of the pancreatic duct in the distal body. No surrounding peripancreatic inflammatory change. The kidneys demonstrate symmetric enhancement and excretion. No hydronephrosis or localizing renal abnormality.  There is dense atherosclerosis of the abdominal aorta without aneurysm.  Urinary bladder is minimally distended. The uterus is not definitively seen. Small amount of free fluid adjacent to fluid-filled bowel loops in the anterior lower pelvis.  Significant scoliotic curvature of the lumbar spine with associated severe multilevel degenerative change. No acute osseous abnormalities seen.  IMPRESSION: 1. Diffusely distended fluid-filled small bowel without definite transition point. Findings may reflect ileus, enteritis, or less likely early small bowel obstruction. Small amount of small bowel enhancement and free fluid, favoring  enteritis. 2. Hepatic cysts. There is mild central intrahepatic biliary ductal dilatation, however the common bile duct is normal. The significance of this is uncertain. 3. Small cystic outpouchings from the uncinate process of the pancreas with irregularity of the pancreatic duct in the body. 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 No prior exams are available for comparison. Consider follow-up MRI or CT in 1 year. 4. Significant atherosclerosis.   Electronically Signed   By: Rubye Oaks M.D.   On: 02/12/2015 01:11   Dg Abd 2 Views  02/14/2015   CLINICAL DATA:  Abdominal pain and distention, ileus.  EXAM: ABDOMEN - 2 VIEW  COMPARISON:  02/13/2015  FINDINGS: Previously provided oral contrast has progressed into the upper descending colon. Small bowel fluid levels persists, although there is less distention than at admission. No concerning intra-abdominal mass effect. No pneumoperitoneum. There is unchanged streaky opacity at the peripheral left base, likely atelectasis.  IMPRESSION: Oral contrast has progressed into the descending colon. Small bowel distention persists but is gradually improving.   Electronically Signed   By: Marnee Spring M.D.   On: 02/14/2015 07:12   Dg Abd 2 Views  02/12/2015   CLINICAL DATA:  Abdominal distention with nausea and vomiting  EXAM: ABDOMEN - 2 VIEW  COMPARISON:  CT abdomen and pelvis February 12, 2015  FINDINGS: Supine and upright images obtained. There is moderate generalized small bowel dilatation with multiple air-fluid levels. There is moderate air in the colon. Air is seen in the rectum. No free air. There is extensive arthropathy in the lumbar spine with lumbar dextroscoliosis.  IMPRESSION: The bowel gas pattern is consistent with ileus or enteritis. A degree of obstruction could present in this manner, although air is noted throughout the colon and rectum. No free air. Advanced arthropathy in the lumbar spine with lumbar scoliosis.   Electronically  Signed   By: Bretta Bang III M.D.   On: 02/12/2015 09:24     CBC  Recent Labs Lab 02/11/15 2234 02/11/15 2239 02/12/15 0315 02/13/15 0654 02/14/15 0747  WBC 10.4  --  11.2* 10.9* 8.1  HGB 13.7 15.6* 12.2 13.1 11.7*  HCT 39.3 46.0 34.9* 38.5 34.4*  PLT 209  --  189 205 192  MCV 90.3  --  90.2 92.1 92.5  MCH 31.5  --  31.5 31.3 31.5  MCHC 34.9  --  35.0 34.0 34.0  RDW 12.6  --  12.7 13.1 13.2  LYMPHSABS 0.8  --   --   --   --   MONOABS 0.6  --   --   --   --  EOSABS 0.0  --   --   --   --   BASOSABS 0.0  --   --   --   --     Chemistries   Recent Labs Lab 02/11/15 2239 02/12/15 0315 02/13/15 0654 02/14/15 0747  NA 123* 124* 128* 133*  K 4.2 3.8 3.7 3.8  CL 87* 89* 96 102  CO2  --  26 23 23   GLUCOSE 175* 118* 92 65*  BUN 22 14 16 18   CREATININE 0.90 0.76 0.95 0.91  CALCIUM  --  8.8 9.1 8.6  AST  --  28  --   --   ALT  --  17  --   --   ALKPHOS  --  46  --   --   BILITOT  --  0.9  --   --    ------------------------------------------------------------------------------------------------------------------ estimated creatinine clearance is 33.3 mL/min (by C-G formula based on Cr of 0.91). ------------------------------------------------------------------------------------------------------------------ No results for input(s): HGBA1C in the last 72 hours. ------------------------------------------------------------------------------------------------------------------ No results for input(s): CHOL, HDL, LDLCALC, TRIG, CHOLHDL, LDLDIRECT in the last 72 hours. ------------------------------------------------------------------------------------------------------------------  Recent Labs  02/13/15 1141  TSH 1.470   ------------------------------------------------------------------------------------------------------------------ No results for input(s): VITAMINB12, FOLATE, FERRITIN, TIBC, IRON, RETICCTPCT in the last 72 hours.  Coagulation profile No results  for input(s): INR, PROTIME in the last 168 hours.  No results for input(s): DDIMER in the last 72 hours.  Cardiac Enzymes No results for input(s): CKMB, TROPONINI, MYOGLOBIN in the last 168 hours.  Invalid input(s): CK ------------------------------------------------------------------------------------------------------------------ Invalid input(s): POCBNP     Time Spent in minutes  35   Ysidra Sopher K M.D on 02/14/2015 at 10:35 AM  Between 7am to 7pm - Pager - [phone]  After 7pm go to [email or web address] - password [facility]  [facility]   Office  [phone]

## 2015-02-15 LAB — HEPARIN LEVEL (UNFRACTIONATED): HEPARIN UNFRACTIONATED: 0.18 [IU]/mL — AB (ref 0.30–0.70)

## 2015-02-15 LAB — CBC
HCT: 33.4 % — ABNORMAL LOW (ref 36.0–46.0)
HEMOGLOBIN: 11.6 g/dL — AB (ref 12.0–15.0)
MCH: 31.9 pg (ref 26.0–34.0)
MCHC: 34.7 g/dL (ref 30.0–36.0)
MCV: 91.8 fL (ref 78.0–100.0)
Platelets: 194 10*3/uL (ref 150–400)
RBC: 3.64 MIL/uL — ABNORMAL LOW (ref 3.87–5.11)
RDW: 13.2 % (ref 11.5–15.5)
WBC: 5 10*3/uL (ref 4.0–10.5)

## 2015-02-15 LAB — BASIC METABOLIC PANEL
Anion gap: 7 (ref 5–15)
BUN: 11 mg/dL (ref 6–23)
CO2: 24 mmol/L (ref 19–32)
CREATININE: 0.86 mg/dL (ref 0.50–1.10)
Calcium: 8.1 mg/dL — ABNORMAL LOW (ref 8.4–10.5)
Chloride: 99 mmol/L (ref 96–112)
GFR, EST AFRICAN AMERICAN: 66 mL/min — AB (ref >90)
GFR, EST NON AFRICAN AMERICAN: 57 mL/min — AB (ref >90)
GLUCOSE: 82 mg/dL (ref 70–99)
POTASSIUM: 3.1 mmol/L — AB (ref 3.5–5.1)
SODIUM: 130 mmol/L — AB (ref 135–145)

## 2015-02-15 LAB — GLUCOSE, CAPILLARY: GLUCOSE-CAPILLARY: 74 mg/dL (ref 70–99)

## 2015-02-15 MED ORDER — DOCUSATE SODIUM 100 MG PO CAPS
100.0000 mg | ORAL_CAPSULE | Freq: Every day | ORAL | Status: DC
  Administered 2015-02-15 – 2015-02-16 (×2): 100 mg via ORAL
  Filled 2015-02-15: qty 1

## 2015-02-15 MED ORDER — POTASSIUM CHLORIDE CRYS ER 20 MEQ PO TBCR
40.0000 meq | EXTENDED_RELEASE_TABLET | Freq: Two times a day (BID) | ORAL | Status: AC
  Administered 2015-02-15 (×2): 40 meq via ORAL
  Filled 2015-02-15 (×2): qty 2

## 2015-02-15 MED ORDER — POLYETHYLENE GLYCOL 3350 17 G PO PACK
17.0000 g | PACK | Freq: Every day | ORAL | Status: DC
  Administered 2015-02-15 – 2015-02-16 (×2): 17 g via ORAL
  Filled 2015-02-15 (×2): qty 1

## 2015-02-15 MED ORDER — RIVAROXABAN 15 MG PO TABS
15.0000 mg | ORAL_TABLET | Freq: Every day | ORAL | Status: DC
  Administered 2015-02-15 – 2015-02-16 (×2): 15 mg via ORAL
  Filled 2015-02-15 (×2): qty 1

## 2015-02-15 NOTE — Progress Notes (Signed)
Patient Demographics  Theresa Kennedy Delmar, is a 81 y.o. female, DOB - 01/02/1921, RUE:[id number]  Admit date - 02/11/2015   Admitting Physician Lorretta HarpXilin Niu, MD  Outpatient Primary MD for the patient is Theresa Kennedy,Theresa ROBERT, MD  LOS - 3   Chief Complaint  Patient presents with  . Abdominal Pain        Subjective:   Theresa Kennedy Tait today has, No headache, No chest pain, Mild generalized Abdominal pain - No Nausea, No new weakness tingling or numbness, No Cough - SOB.    Assessment & Plan    1. Abdominal pain nausea vomiting. Ileus resolved with bowel rest, bowel regimen and IV fluids now having BMs and tolerating diet will advance, appreciate general surgery input. If symptom-free will discharge in the morning.   2. Chronic atrial fibrillation. CHADS2Vasc Score is 6 - assume xaralto, beta blocker due to low resting heart rate in mid 40s.   3. Hyponatremia. Due to dehydration resolved after IV fluids.   4. History of CVA. Resume home dose xaralto for secondary prevention.   5. Dyslipidemia. On statin   6. GERD. Continue PPI   7. Essential hypertension. On Norvasc and Cozaar, beta blocker stopped as resting heart rate was less than 45.      Code Status: Full  Family Communication: None  Disposition Plan: Home   Procedures   CT Abd Pelvis  XRay   Consults  CCS   Medications  Scheduled Meds: . amLODipine  10 mg Oral QPM  . atorvastatin  10 mg Oral QPM  . calcium-vitamin D  1 tablet Oral Daily  . carbamide peroxide  5 drop Both Ears BID  . docusate sodium  100 mg Oral Daily  . losartan  50 mg Oral QPM  . multivitamin with minerals  1 tablet Oral Daily  . pantoprazole  40 mg Oral Daily  . polyethylene glycol  17 g Oral Daily  . potassium chloride  40 mEq Oral BID  .  rivaroxaban  15 mg Oral Daily  . sodium chloride  3 mL Intravenous Q12H  . sodium phosphate  1 enema Rectal Daily   Continuous Infusions:   PRN Meds:.acetaminophen **OR** acetaminophen, morphine injection, ondansetron  DVT Prophylaxis Heparin gtt  Lab Results  Component Value Date   PLT 194 02/15/2015    Antibiotics    Anti-infectives    None          Objective:   Filed Vitals:   02/14/15 2156 02/15/15 0007 02/15/15 0501 02/15/15 0511  BP: 134/52  105/54   Pulse: 73  59   Temp: 98.1 F (36.7 C)  98.4 F (36.9 C)   TempSrc: Oral  Oral   Resp: 16  20   Height:      Weight:  58 kg (127 lb 13.9 oz)  56.246 kg (124 lb)  SpO2: 97%  97%     Wt Readings from Last 3 Encounters:  02/15/15 56.246 kg (124 lb)  08/10/14 53.978 kg (119 lb)  06/29/14 53.162 kg (117 lb 3.2 oz)     Intake/Output Summary (Last 24 hours) at 02/15/15 0958 Last data filed at 02/15/15 0900  Gross per 24 hour  Intake   2161 ml  Output  0 ml  Net   2161 ml     Physical Exam  Awake Alert, Oriented X 3, No new F.N deficits, Normal affect [place].AT,PERRAL Supple Neck,No JVD, No cervical lymphadenopathy appriciated.  Symmetrical Chest wall movement, Good air movement bilaterally, CTAB RRR,No Gallops,Rubs or new Murmurs, No Parasternal Heave +ve B.Sounds, Abd Soft, No tenderness, No organomegaly appriciated, No rebound - guarding or rigidity. No Cyanosis, Clubbing or edema, No new Rash or bruise     Data Review   Micro Results No results found for this or any previous visit (from the past 240 hour(s)).  Radiology Reports Dg Abd 1 View  02/13/2015   CLINICAL DATA:  Generalized abdominal pain  EXAM: ABDOMEN - 1 VIEW  COMPARISON:  02/13/2015 supine abdominal radiograph  FINDINGS: Upright abdominal radiograph demonstrates multiple differential air-fluid levels and re- demonstrates small bowel dilatation over the left mid abdomen predominantly. Retained contrast noted within bowel over the  right mid abdomen. Rightward thoracolumbar scoliosis noted. Patchy left basilar airspace opacity is reidentified. Multiple leads overlie the abdomen.  IMPRESSION: Differential air-fluid levels and small bowel dilatation most compatible with mid to distal small bowel obstruction.   Electronically Signed   By: Christiana Pellant M.D.   On: 02/13/2015 09:42   Dg Abd 1 View  02/13/2015   CLINICAL DATA:  Ileus  EXAM: ABDOMEN - 1 VIEW  COMPARISON:  02/12/2015  FINDINGS: Diffuse prominence of multiple gas-filled small and large bowel loops is reidentified. Over the left mid abdomen, there are numerous dilated loops of small bowel, largest 4.1 cm in maximal diameter. Rightward curvature of the thoracolumbar spine noted with multilevel disc degenerative change. Presence or absence of air-fluid levels or free air is suboptimally evaluated on this supine projection.  IMPRESSION: Diffuse prominence of gas-filled small and large bowel loops again noted which may indicate ileus, although there are several dilated left mid abdominal loops of small bowel which could indicate ileus although early/ partial small bowel obstruction could appear similar.   Electronically Signed   By: Christiana Pellant M.D.   On: 02/13/2015 09:20   Ct Abdomen Pelvis W Contrast  02/12/2015   CLINICAL DATA:  81 year old female with diffuse abdominal pain for 2 days.  EXAM: CT ABDOMEN AND PELVIS WITH CONTRAST  TECHNIQUE: Multidetector CT imaging of the abdomen and pelvis was performed using the standard protocol following bolus administration of intravenous contrast.  CONTRAST:  80mL OMNIPAQUE IOHEXOL 300 MG/ML  SOLN  COMPARISON:  Abdominal radiographs earlier this day.  FINDINGS: Evaluation of the lung bases demonstrate scattered atelectasis and mild lymphedema. The heart is mildly enlarged. Coronary artery calcifications are seen. There is a moderate hiatal hernia.  Stomach is mildly distended. Small bowel loops are diffusely distended with fluid and  contrast. No definite decompressed small bowel loops are seen. Mild small-bowel enhancement without definite wall thickening. There is mild mesenteric edema and small amount of free fluid. The ileocecal valve not well seen, cecum appears high-riding. Small to moderate volume of colonic stool, the descending and sigmoid colon are decompressed. There is no evidence of pneumatosis. No definite free air.  Two cyst in the right lobe of the liver, 3.2 cm cyst in the subcapsular region, 4.1 cm cyst lower medial liver. The gallbladder is physiologically distended, no definite calcified gallstones. Common bile duct is normal measuring 5 mm. There is mild prominence of the central intrahepatic biliary tree. The spleen and adrenal glands are normal. There is mild pancreatic atrophy. Cystic L approaching of pancreatic duct  measuring 7 mm in the uncinate process. There is mild irregularity and prominence of the pancreatic duct in the distal body. No surrounding peripancreatic inflammatory change. The kidneys demonstrate symmetric enhancement and excretion. No hydronephrosis or localizing renal abnormality.  There is dense atherosclerosis of the abdominal aorta without aneurysm.  Urinary bladder is minimally distended. The uterus is not definitively seen. Small amount of free fluid adjacent to fluid-filled bowel loops in the anterior lower pelvis.  Significant scoliotic curvature of the lumbar spine with associated severe multilevel degenerative change. No acute osseous abnormalities seen.  IMPRESSION: 1. Diffusely distended fluid-filled small bowel without definite transition point. Findings may reflect ileus, enteritis, or less likely early small bowel obstruction. Small amount of small bowel enhancement and free fluid, favoring enteritis. 2. Hepatic cysts. There is mild central intrahepatic biliary ductal dilatation, however the common bile duct is normal. The significance of this is uncertain. 3. Small cystic outpouchings  from the uncinate process of the pancreas with irregularity of the pancreatic duct in the body. 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 No prior exams are available for comparison. Consider follow-up MRI or CT in 1 year. 4. Significant atherosclerosis.   Electronically Signed   By: Rubye Oaks M.D.   On: 02/12/2015 01:11   Dg Abd 2 Views  02/14/2015   CLINICAL DATA:  Abdominal pain and distention, ileus.  EXAM: ABDOMEN - 2 VIEW  COMPARISON:  02/13/2015  FINDINGS: Previously provided oral contrast has progressed into the upper descending colon. Small bowel fluid levels persists, although there is less distention than at admission. No concerning intra-abdominal mass effect. No pneumoperitoneum. There is unchanged streaky opacity at the peripheral left base, likely atelectasis.  IMPRESSION: Oral contrast has progressed into the descending colon. Small bowel distention persists but is gradually improving.   Electronically Signed   By: Marnee Spring M.D.   On: 02/14/2015 07:12   Dg Abd 2 Views  02/12/2015   CLINICAL DATA:  Abdominal distention with nausea and vomiting  EXAM: ABDOMEN - 2 VIEW  COMPARISON:  CT abdomen and pelvis February 12, 2015  FINDINGS: Supine and upright images obtained. There is moderate generalized small bowel dilatation with multiple air-fluid levels. There is moderate air in the colon. Air is seen in the rectum. No free air. There is extensive arthropathy in the lumbar spine with lumbar dextroscoliosis.  IMPRESSION: The bowel gas pattern is consistent with ileus or enteritis. A degree of obstruction could present in this manner, although air is noted throughout the colon and rectum. No free air. Advanced arthropathy in the lumbar spine with lumbar scoliosis.   Electronically Signed   By: Bretta Bang III M.D.   On: 02/12/2015 09:24     CBC  Recent Labs Lab 02/11/15 2234 02/11/15 2239 02/12/15 0315 02/13/15 0654 02/14/15 0747 02/15/15 0538  WBC 10.4  --   11.2* 10.9* 8.1 5.0  HGB 13.7 15.6* 12.2 13.1 11.7* 11.6*  HCT 39.3 46.0 34.9* 38.5 34.4* 33.4*  PLT 209  --  189 205 192 194  MCV 90.3  --  90.2 92.1 92.5 91.8  MCH 31.5  --  31.5 31.3 31.5 31.9  MCHC 34.9  --  35.0 34.0 34.0 34.7  RDW 12.6  --  12.7 13.1 13.2 13.2  LYMPHSABS 0.8  --   --   --   --   --   MONOABS 0.6  --   --   --   --   --   EOSABS 0.0  --   --   --   --   --  BASOSABS 0.0  --   --   --   --   --     Chemistries   Recent Labs Lab 02/11/15 2239 02/12/15 0315 02/13/15 0654 02/14/15 0747 02/15/15 0538  NA 123* 124* 128* 133* 130*  K 4.2 3.8 3.7 3.8 3.1*  CL 87* 89* 96 102 99  CO2  --  GLUCOSE 175* 118* 92 65* 82  BUN CREATININE 0.90 0.76 0.95 0.91 0.86  CALCIUM  --  8.8 9.1 8.6 8.1*  AST  --  28  --   --   --   ALT  --  17  --   --   --   ALKPHOS  --  46  --   --   --   BILITOT  --  0.9  --   --   --    ------------------------------------------------------------------------------------------------------------------ estimated creatinine clearance is 35.2 mL/min (by C-G formula based on Cr of 0.86). ------------------------------------------------------------------------------------------------------------------ No results for input(s): HGBA1C in the last 72 hours. ------------------------------------------------------------------------------------------------------------------ No results for input(s): CHOL, HDL, LDLCALC, TRIG, CHOLHDL, LDLDIRECT in the last 72 hours. ------------------------------------------------------------------------------------------------------------------  Recent Labs  02/13/15 1141  TSH 1.470   ------------------------------------------------------------------------------------------------------------------ No results for input(s): VITAMINB12, FOLATE, FERRITIN, TIBC, IRON, RETICCTPCT in the last 72 hours.  Coagulation profile No results for input(s): INR, PROTIME in the last 168 hours.  No  results for input(s): DDIMER in the last 72 hours.  Cardiac Enzymes No results for input(s): CKMB, TROPONINI, MYOGLOBIN in the last 168 hours.  Invalid input(s): CK ------------------------------------------------------------------------------------------------------------------ Invalid input(s): POCBNP     Time Spent in minutes  35   Jatziry Wechter K M.D on 02/15/2015 at 9:58 AM  Between 7am to 7pm - Pager - [phone]  After 7pm go to [email or web address] - password [facility]  [facility]   Office  [phone]

## 2015-02-15 NOTE — Progress Notes (Signed)
Patient ID: Theresa Kennedy, female   DOB: 05/06/1921, 81 y.o.   MRN: [id number]    Subjective: Pt feels well this morning.  Much better she says.  She has had 3-4 really good BMs.  She is tolerating her liquid diet with no issues  Objective: Vital signs in last 24 hours: Temp:  [98.1 F (36.7 C)-98.4 F (36.9 C)] 98.4 F (36.9 C) (03/29 0501) Pulse Rate:  [59-73] 59 (03/29 0501) Resp:  [16-20] 20 (03/29 0501) BP: (105-146)/(51-54) 105/54 mmHg (03/29 0501) SpO2:  [97 %] 97 % (03/29 0501) Weight:  [56.246 kg (124 lb)-58 kg (127 lb 13.9 oz)] 56.246 kg (124 lb) (03/29 0511) Last BM Date: 02/15/15  Intake/Output from previous day: 03/28 0701 - 03/29 0700 In: 2485 [P.O.:1044; I.V.:1441] Out: 800 [Urine:800] Intake/Output this shift:    PE: Abd: soft, NT, Nd, +BS  Lab Results:   Recent Labs  02/14/15 0747 02/15/15 0538  WBC 8.1 5.0  HGB 11.7* 11.6*  HCT 34.4* 33.4*  PLT 192 194   BMET  Recent Labs  02/14/15 0747 02/15/15 0538  NA 133* 130*  K 3.8 3.1*  CL 102 99  CO2 23 24  GLUCOSE 65* 82  BUN 18 11  CREATININE 0.91 0.86  CALCIUM 8.6 8.1*   PT/INR No results for input(s): LABPROT, INR in the last 72 hours. CMP     Component Value Date/Time   NA 130* 02/15/2015 0538   K 3.1* 02/15/2015 0538   CL 99 02/15/2015 0538   CO2 24 02/15/2015 0538   GLUCOSE 82 02/15/2015 0538   BUN 11 02/15/2015 0538   CREATININE 0.86 02/15/2015 0538   CALCIUM 8.1* 02/15/2015 0538   PROT 5.5* 02/12/2015 0315   ALBUMIN 3.5 02/12/2015 0315   AST 28 02/12/2015 0315   ALT 17 02/12/2015 0315   ALKPHOS 46 02/12/2015 0315   BILITOT 0.9 02/12/2015 0315   GFRNONAA 57* 02/15/2015 0538   GFRAA 66* 02/15/2015 0538   Lipase  No results found for: LIPASE     Studies/Results: Dg Abd 2 Views  02/14/2015   CLINICAL DATA:  Abdominal pain and distention, ileus.  EXAM: ABDOMEN - 2 VIEW  COMPARISON:  02/13/2015  FINDINGS: Previously provided oral contrast has progressed into the upper  descending colon. Small bowel fluid levels persists, although there is less distention than at admission. No concerning intra-abdominal mass effect. No pneumoperitoneum. There is unchanged streaky opacity at the peripheral left base, likely atelectasis.  IMPRESSION: Oral contrast has progressed into the descending colon. Small bowel distention persists but is gradually improving.   Electronically Signed   By: Marnee SpringJonathon  Watts M.D.   On: 02/14/2015 07:12    Anti-infectives: Anti-infectives    None       Assessment/Plan  1. Ileus -agree with diet advancement -no surgical indications -if she tolerates her diet, she is surgically stable for dc home when medically stable. -we will sign off.  Please call if needed.   LOS: 3 days    Brix Brearley E 02/15/2015, 9:29 AM Pager: [phone]

## 2015-02-15 NOTE — Progress Notes (Signed)
PT Cancellation Note  Patient Details Name: Theresa Kennedy MRN: [id number] DOB: 03/26/1921   Cancelled Treatment:    Reason Eval/Treat Not Completed: PT screened, no needs identified, will sign off.  Pt evaluated by PT on 3/26 and was independent with all mobility.  Spoke with RN who indicates pt continues to be mobile and notes no changes since PT eval.  Will cancel PT order as pt with no mobility deficits or PT needs at this time.     [name], [name] 02/15/2015, 11:00 AM

## 2015-02-15 NOTE — Progress Notes (Signed)
ANTICOAGULATION CONSULT NOTE - Follow Up Consult  Pharmacy Consult for heparin Indication: atrial fibrillation   Labs:  Recent Labs  02/13/15 0654  02/13/15 1157 02/13/15 2240 02/14/15 0747 02/14/15 1439 02/15/15 0538  HGB 13.1  --   --   --  11.7*  --  11.6*  HCT 38.5  --   --   --  34.4*  --  33.4*  PLT 205  --   --   --  192  --  194  APTT  --   --  162* 151* 68*  --   --   HEPARINUNFRC  --   < >  --  1.26* 0.58 0.37 0.18*  CREATININE 0.95  --   --   --  0.91  --   --   < > = values in this interval not displayed.    Assessment: 81yo female now subtherapeutic on heparin after two levels at goal though had been trending down quickly, was previously above goal at 600 units/hr.  Goal of Therapy:  Heparin level 0.3-0.7 units/ml   Plan:  Will increase heparin gtt by 2 units/kg/hr to 500 units/hr and check level in 8hr.  Vernard GamblesVeronda Deniz Hannan, PharmD, BCPS  02/15/2015,6:16 AM

## 2015-02-15 NOTE — Progress Notes (Signed)
ANTICOAGULATION CONSULT NOTE - Follow Up Consult  Pharmacy Consult for Xarelto Indication: atrial fibrillation  Allergies  Allergen Reactions  . Sulfa Antibiotics Other (See Comments)    unknown    Patient Measurements: Height: 5\' 3"  (160 cm) Weight: 124 lb (56.246 kg) IBW/kg (Calculated) : 52.4 Heparin Dosing Weight:    Vital Signs: Temp: 98.4 F (36.9 C) (03/29 0501) Temp Source: Oral (03/29 0501) BP: 105/54 mmHg (03/29 0501) Pulse Rate: 59 (03/29 0501)  Labs:  Recent Labs  02/13/15 0654  02/13/15 1157 02/13/15 2240 02/14/15 0747 02/14/15 1439 02/15/15 0538  HGB 13.1  --   --   --  11.7*  --  11.6*  HCT 38.5  --   --   --  34.4*  --  33.4*  PLT 205  --   --   --  192  --  194  APTT  --   --  162* 151* 68*  --   --   HEPARINUNFRC  --   < >  --  1.26* 0.58 0.37 0.18*  CREATININE 0.95  --   --   --  0.91  --  0.86  < > = values in this interval not displayed.  Estimated Creatinine Clearance: 35.2 mL/min (by C-G formula based on Cr of 0.86).  Assessment: 81 yo female with hx of afib transitioned from xarelto (last dose 3/26 AM) to heparin.Baseline aPTT and heparin level are 43 and >2.2, respectively. Patient was on xarelto 20 mg daily PTA but renal function was ~38 and initially MD said ok to reduce dose, but was switched to heparin for poss. abdominal surgery.   Anticoag: Afib on Xarelto PTA. Resume 3/29 at lower dose for CrCl 35.CBC stable overnight. CHADS2Vasc Score is 6  Card: hx of HTN, HLD and afib. BP 105/54, HR 59.On norvasc10, Lipitor10, losartan, (lopressor d/c'd).   Neuro: hx of stroke on Xarelto  Endo/GI: hx of GERD on protonix. Abdominal pain. CT shows ileus vs SBO. More likely ileus. Surgery is observing. N/V better. Distended but no pain.Try Dulcolax and Miralax. +BM 3/28.  Hem/Onc: see AC  Renal: SCr 0.86 (CrCl ~35). K low 3.1 this AM. Best practice: xarelto, PPI  Home meds: resumed  Goal of Therapy:  Therapeutic oral  anticoagulation Monitor platelets by anticoagulation protocol: Yes   Plan:  Resume Xarelto at reduced dose of 15mg  daily. D/c IV heparin   Prem Coykendall S. Merilynn Finlandobertson, PharmD, BCPS Clinical Staff Pharmacist Pager [phone]  Misty Stanleyobertson, Currie Dennin Stillinger 02/15/2015,8:19 AM

## 2015-02-16 ENCOUNTER — Ambulatory Visit: Payer: PPO | Admitting: Cardiology

## 2015-02-16 LAB — GLUCOSE, CAPILLARY
GLUCOSE-CAPILLARY: 88 mg/dL (ref 70–99)
GLUCOSE-CAPILLARY: 116 mg/dL — AB (ref 70–99)

## 2015-02-16 LAB — POTASSIUM: Potassium: 4.1 mmol/L (ref 3.5–5.1)

## 2015-02-16 LAB — MAGNESIUM: MAGNESIUM: 1.6 mg/dL (ref 1.5–2.5)

## 2015-02-16 MED ORDER — DOCUSATE SODIUM 100 MG PO CAPS: 100.0000 mg | ORAL_CAPSULE | Freq: Every day | ORAL | Status: AC

## 2015-02-16 MED ORDER — METOPROLOL TARTRATE 25 MG PO TABS
25.0000 mg | ORAL_TABLET | Freq: Two times a day (BID) | ORAL | Status: DC
  Administered 2015-02-16: 25 mg via ORAL
  Filled 2015-02-16 (×2): qty 1

## 2015-02-16 MED ORDER — POLYETHYLENE GLYCOL 3350 17 G PO PACK: 17.0000 g | PACK | Freq: Every day | ORAL | Status: DC | PRN

## 2015-02-16 MED ORDER — METOPROLOL TARTRATE 25 MG PO TABS: 25.0000 mg | ORAL_TABLET | Freq: Two times a day (BID) | ORAL | Status: DC

## 2015-02-16 MED ORDER — CARBAMIDE PEROXIDE 6.5 % OT SOLN: 5.0000 [drp] | Freq: Two times a day (BID) | OTIC | Status: DC

## 2015-02-16 MED ORDER — RIVAROXABAN 15 MG PO TABS: 15.0000 mg | ORAL_TABLET | Freq: Every evening | ORAL | Status: DC

## 2015-02-16 NOTE — Progress Notes (Signed)
Patient discharge teaching given, including activity, diet, follow-up appoints, and medications. Patient verbalized understanding of all discharge instructions. IV access was d/c'd. Vitals are stable. Skin is intact except as charted in most recent assessments. Pt to be escorted out by NT, to be driven home by family. 

## 2015-02-16 NOTE — Discharge Summary (Addendum)
Theresa Kennedy, is a 81 y.o. female  DOB Sep 16, 1921  MRN [id number].  Admission date:  02/11/2015  Admitting Physician  Lorretta Harp, MD  Discharge Date:  02/16/2015   Primary MD  [name], MD  Recommendations for primary care physician for things to follow:   Monitor clinically. Check CBC BMP next visit   Admission Diagnosis  Hyponatremia [E87.1] Abdominal pain [R10.9]   Discharge Diagnosis  Hyponatremia [E87.1] Abdominal pain [R10.9]    Principal Problem:   Abdominal pain Active Problems:   HTN (hypertension)   Atrial fibrillation, persistent   CVA (cerebral infarction)   HLD (hyperlipidemia)   GERD (gastroesophageal reflux disease)   Hyponatremia   Nausea & vomiting   Ileus, postoperative      Past Medical History  Diagnosis Date  . Hypertension   . GERD (gastroesophageal reflux disease)   . Atrial fibrillation   . Stroke     Past Surgical History  Procedure Laterality Date  . Abdominal hysterectomy         History of present illness and  Hospital Course:     Kindly see H&P for history of present illness and admission details, please review complete Labs, Consult reports and Test reports for all details in brief  HPI  from the history and physical done on the day of admission   Theresa Kennedy is a 81 y.o. female with past medical history of hypertension, hyperlipidemia, GERD, history of stroke, atrial fibrillation on Xarelto, who presents with abdominal pain.  Patient reports that she has been having abdominal pain in the past 3 days, which has been progressively getting worse. It is associated with nausea and vomiting. She vomited twice today. No blood in the vomitus. She does not have diarrhea. Patient was evaluated in urgent care, where x-ray was done, suggesting possible  small bowel obstruction. The patient was sent to emergency room for further evaluation and treatment. Patient does not have a sick contact. She still passed gas. Patient denies fever, chills, headaches, cough, chest pain, SOB, dysuria, urgency, frequency, hematuria, skin rashes or leg swelling. No unilateral weakness, numbness or tingling sensations. No vision change or hearing loss.  In ED, patient was found to have hyponatremia, WBC 10.4, and INR 2.15, temperature normal, renal function normal. CT abdomen/pelvis showed possible enteritis or ileitis, less likely to have small bowel obstruction per radiologist. Patient is admitted to inpatient for further evaluation and treatment.   Hospital Course    1. Abdominal pain nausea vomiting. Due to Ileus resolved with bowel rest, bowel regimen and IV fluids now having BMs and tolerating diet, appreciate general surgery input. Now symptom-free will discharge this morning. Will be placed on bowel regimen.   2. Chronic atrial fibrillation. CHADS2Vasc Score is 6 - resume xaralto, beta blocker dose reduced due to resting bradycardia in mid 40s. Xaralto dose has been adjusted by pharmacy based on renal function to 15 mg daily.   3. Hyponatremia. Due to dehydration resolved after IV fluids.   4. History of CVA.  Resume home dose xaralto for secondary prevention.   5. Dyslipidemia. On statin   6. GERD. Continue PPI   7. Essential hypertension. On Norvasc and Cozaar, beta blocker dose has been reduced in half as resting heart rate was less than 45.   Discharge Condition: Stable   Follow UP  Follow-up Information    Follow up with [name], MD. Schedule an appointment as soon as possible for a visit on 02/23/2015.   Specialty:  Family Medicine   Why:  Appointment with Dr. Lysbeth Galas is on 02/23/15 at 10am   Contact information:   [address] [place] [address] [phone]       Follow up with [facility] Schedule an  appointment as soon as possible for a visit in 1 week.   Why:  Atrial fibrillation   Contact information:   [address] [place] [address]         Discharge Instructions  and  Discharge Medications          Discharge Instructions    Discharge instructions    Complete by:  As directed   Follow with Primary MD [name], MD in 7 days   Get CBC, CMP, 2 view Chest X ray checked  by Primary MD next visit.    Activity: As tolerated with Full fall precautions use walker/cane & assistance as needed   Disposition Home     Diet: Heart Healthy   For Heart failure patients - Check your Weight same time everyday, if you gain over 2 pounds, or you develop in leg swelling, experience more shortness of breath or chest pain, call your Primary MD immediately. Follow Cardiac Low Salt Diet and 1.5 lit/day fluid restriction.   On your next visit with your primary care physician please Get Medicines reviewed and adjusted.   Please request your Prim.MD to go over all Hospital Tests and Procedure/Radiological results at the follow up, please get all Hospital records sent to your Prim MD by signing hospital release before you go home.   If you experience worsening of your admission symptoms, develop shortness of breath, life threatening emergency, suicidal or homicidal thoughts you must seek medical attention immediately by calling 911 or calling your MD immediately  if symptoms less severe.  You Must read complete instructions/literature along with all the possible adverse reactions/side effects for all the Medicines you take and that have been prescribed to you. Take any new Medicines after you have completely understood and accpet all the possible adverse reactions/side effects.   Do not drive, operating heavy machinery, perform activities at heights, swimming or participation in water activities or provide baby sitting services if your were admitted for  syncope or siezures until you have seen by Primary MD or a Neurologist and advised to do so again.  Do not drive when taking Pain medications.    Do not take more than prescribed Pain, Sleep and Anxiety Medications  Special Instructions: If you have smoked or chewed Tobacco  in the last 2 yrs please stop smoking, stop any regular Alcohol  and or any Recreational drug use.  Wear Seat belts while driving.   Please note  You were cared for by a hospitalist during your hospital stay. If you have any questions about your discharge medications or the care you received while you were in the hospital after you are discharged, you can call the unit and asked to speak with the hospitalist on call if the hospitalist  that took care of you is not available. Once you are discharged, your primary care physician will handle any further medical issues. Please note that NO REFILLS for any discharge medications will be authorized once you are discharged, as it is imperative that you return to your primary care physician (or establish a relationship with a primary care physician if you do not have one) for your aftercare needs so that they can reassess your need for medications and monitor your lab values.     Increase activity slowly    Complete by:  As directed             Medication List    TAKE these medications        amLODipine 10 MG tablet  Commonly known as:  NORVASC  Take 10 mg by mouth every evening.     atorvastatin 10 MG tablet  Commonly known as:  LIPITOR  Take 10 mg by mouth every evening.     Calcium Carbonate-Vitamin D 600-200 MG-UNIT Tabs  Take 1 tablet by mouth daily.     carbamide peroxide 6.5 % otic solution  Commonly known as:  DEBROX  Place 5 drops into both ears 2 (two) times daily.     docusate sodium 100 MG capsule  Commonly known as:  COLACE  Take 1 capsule (100 mg total) by mouth daily.     Grape Seed Extract 100 MG Caps  Take 100 mg by mouth daily.     losartan  25 MG tablet  Commonly known as:  COZAAR  Take 50 mg by mouth every evening.     metoprolol tartrate 25 MG tablet  Commonly known as:  LOPRESSOR  Take 1 tablet (25 mg total) by mouth 2 (two) times daily.     multivitamin with minerals Tabs tablet  Take 1 tablet by mouth daily.     omeprazole 40 MG capsule  Commonly known as:  PRILOSEC  Take 40 mg by mouth every evening.     polyethylene glycol packet  Commonly known as:  MIRALAX / GLYCOLAX  Take 17 g by mouth daily as needed for mild constipation.     psyllium 58.6 % packet  Commonly known as:  METAMUCIL  Take 1 packet by mouth daily.     Rivaroxaban 15 MG Tabs tablet  Commonly known as:  XARELTO  Take 1 tablet (15 mg total) by mouth every evening.          Diet and Activity recommendation: See Discharge Instructions above   Consults obtained - [facility]   Major procedures and Radiology Reports - PLEASE review detailed and final reports for all details, in brief -       Dg Abd 1 View  02/13/2015   CLINICAL DATA:  Generalized abdominal pain  EXAM: ABDOMEN - 1 VIEW  COMPARISON:  02/13/2015 supine abdominal radiograph  FINDINGS: Upright abdominal radiograph demonstrates multiple differential air-fluid levels and re- demonstrates small bowel dilatation over the left mid abdomen predominantly. Retained contrast noted within bowel over the right mid abdomen. Rightward thoracolumbar scoliosis noted. Patchy left basilar airspace opacity is reidentified. Multiple leads overlie the abdomen.  IMPRESSION: Differential air-fluid levels and small bowel dilatation most compatible with mid to distal small bowel obstruction.   Electronically Signed   By: Christiana Pellant M.D.   On: 02/13/2015 09:42   Dg Abd 1 View  02/13/2015   CLINICAL DATA:  Ileus  EXAM: ABDOMEN - 1 VIEW  COMPARISON:  02/12/2015  FINDINGS: Diffuse prominence  of multiple gas-filled small and large bowel loops is reidentified. Over the left mid abdomen, there are numerous  dilated loops of small bowel, largest 4.1 cm in maximal diameter. Rightward curvature of the thoracolumbar spine noted with multilevel disc degenerative change. Presence or absence of air-fluid levels or free air is suboptimally evaluated on this supine projection.  IMPRESSION: Diffuse prominence of gas-filled small and large bowel loops again noted which may indicate ileus, although there are several dilated left mid abdominal loops of small bowel which could indicate ileus although early/ partial small bowel obstruction could appear similar.   Electronically Signed   By: Christiana Pellant M.D.   On: 02/13/2015 09:20   Ct Abdomen Pelvis W Contrast  02/12/2015   CLINICAL DATA:  81 year old female with diffuse abdominal pain for 2 days.  EXAM: CT ABDOMEN AND PELVIS WITH CONTRAST  TECHNIQUE: Multidetector CT imaging of the abdomen and pelvis was performed using the standard protocol following bolus administration of intravenous contrast.  CONTRAST:  80mL OMNIPAQUE IOHEXOL 300 MG/ML  SOLN  COMPARISON:  Abdominal radiographs earlier this day.  FINDINGS: Evaluation of the lung bases demonstrate scattered atelectasis and mild lymphedema. The heart is mildly enlarged. Coronary artery calcifications are seen. There is a moderate hiatal hernia.  Stomach is mildly distended. Small bowel loops are diffusely distended with fluid and contrast. No definite decompressed small bowel loops are seen. Mild small-bowel enhancement without definite wall thickening. There is mild mesenteric edema and small amount of free fluid. The ileocecal valve not well seen, cecum appears high-riding. Small to moderate volume of colonic stool, the descending and sigmoid colon are decompressed. There is no evidence of pneumatosis. No definite free air.  Two cyst in the right lobe of the liver, 3.2 cm cyst in the subcapsular region, 4.1 cm cyst lower medial liver. The gallbladder is physiologically distended, no definite calcified gallstones. Common  bile duct is normal measuring 5 mm. There is mild prominence of the central intrahepatic biliary tree. The spleen and adrenal glands are normal. There is mild pancreatic atrophy. Cystic L approaching of pancreatic duct measuring 7 mm in the uncinate process. There is mild irregularity and prominence of the pancreatic duct in the distal body. No surrounding peripancreatic inflammatory change. The kidneys demonstrate symmetric enhancement and excretion. No hydronephrosis or localizing renal abnormality.  There is dense atherosclerosis of the abdominal aorta without aneurysm.  Urinary bladder is minimally distended. The uterus is not definitively seen. Small amount of free fluid adjacent to fluid-filled bowel loops in the anterior lower pelvis.  Significant scoliotic curvature of the lumbar spine with associated severe multilevel degenerative change. No acute osseous abnormalities seen.  IMPRESSION: 1. Diffusely distended fluid-filled small bowel without definite transition point. Findings may reflect ileus, enteritis, or less likely early small bowel obstruction. Small amount of small bowel enhancement and free fluid, favoring enteritis. 2. Hepatic cysts. There is mild central intrahepatic biliary ductal dilatation, however the common bile duct is normal. The significance of this is uncertain. 3. Small cystic outpouchings from the uncinate process of the pancreas with irregularity of the pancreatic duct in the body. 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 No prior exams are available for comparison. Consider follow-up MRI or CT in 1 year. 4. Significant atherosclerosis.   Electronically Signed   By: Rubye Oaks M.D.   On: 02/12/2015 01:11   Dg Abd 2 Views  02/14/2015   CLINICAL DATA:  Abdominal pain and distention, ileus.  EXAM: ABDOMEN - 2 VIEW  COMPARISON:  02/13/2015  FINDINGS: Previously provided oral contrast has progressed into the upper descending colon. Small bowel fluid levels persists,  although there is less distention than at admission. No concerning intra-abdominal mass effect. No pneumoperitoneum. There is unchanged streaky opacity at the peripheral left base, likely atelectasis.  IMPRESSION: Oral contrast has progressed into the descending colon. Small bowel distention persists but is gradually improving.   Electronically Signed   By: Marnee SpringJonathon  Watts M.D.   On: 02/14/2015 07:12   Dg Abd 2 Views  02/12/2015   CLINICAL DATA:  Abdominal distention with nausea and vomiting  EXAM: ABDOMEN - 2 VIEW  COMPARISON:  CT abdomen and pelvis February 12, 2015  FINDINGS: Supine and upright images obtained. There is moderate generalized small bowel dilatation with multiple air-fluid levels. There is moderate air in the colon. Air is seen in the rectum. No free air. There is extensive arthropathy in the lumbar spine with lumbar dextroscoliosis.  IMPRESSION: The bowel gas pattern is consistent with ileus or enteritis. A degree of obstruction could present in this manner, although air is noted throughout the colon and rectum. No free air. Advanced arthropathy in the lumbar spine with lumbar scoliosis.   Electronically Signed   By: Bretta BangWilliam  Woodruff III M.D.   On: 02/12/2015 09:24    Micro Results      No results found for this or any previous visit (from the past 240 hour(s)).     Today   Subjective:   Garnette CzechBessie Ayad today has no headache,no chest abdominal pain,no new weakness tingling or numbness, feels much better wants to go home today.    Objective:   Blood pressure 115/41, pulse 60, temperature 98.3 F (36.8 C), temperature source Oral, resp. rate 16, height 5\' 3"  (1.6 m), weight 60.102 kg (132 lb 8 oz), SpO2 92 %.   Intake/Output Summary (Last 24 hours) at 02/16/15 1049 Last data filed at 02/16/15 0900  Gross per 24 hour  Intake    250 ml  Output      0 ml  Net    250 ml    Exam Awake Alert, Oriented x 3, No new F.N deficits, Normal affect [place].AT,PERRAL Supple Neck,No JVD,  No cervical lymphadenopathy appriciated.  Symmetrical Chest wall movement, Good air movement bilaterally, CTAB iRRR,No Gallops,Rubs or new Murmurs, No Parasternal Heave +ve B.Sounds, Abd Soft, Non tender, No organomegaly appriciated, No rebound -guarding or rigidity. No Cyanosis, Clubbing or edema, No new Rash or bruise  Data Review   CBC w Diff:  Lab Results  Component Value Date   WBC 5.0 02/15/2015   HGB 11.6* 02/15/2015   HCT 33.4* 02/15/2015   PLT 194 02/15/2015   LYMPHOPCT 8* 02/11/2015   MONOPCT 5 02/11/2015   EOSPCT 0 02/11/2015   BASOPCT 0 02/11/2015    CMP:  Lab Results  Component Value Date   NA 130* 02/15/2015   K 4.1 02/16/2015   CL 99 02/15/2015   CO2 24 02/15/2015   BUN 11 02/15/2015   CREATININE 0.86 02/15/2015   PROT 5.5* 02/12/2015   ALBUMIN 3.5 02/12/2015   BILITOT 0.9 02/12/2015   ALKPHOS 46 02/12/2015   AST 28 02/12/2015   ALT 17 02/12/2015  .   Total Time in preparing paper work, data evaluation and todays exam - 35 minutes  Leroy SeaSINGH,Charl Wellen K M.D on 02/16/2015 at 10:49 AM  [facility]   Office  [phone]

## 2015-02-16 NOTE — Discharge Instructions (Signed)
Follow with Primary MD [name],[name], MD in 7 days   Get CBC, CMP, 2 view Chest X ray checked  by Primary MD next visit.    Activity: As tolerated with Full fall precautions use walker/cane & assistance as needed   Disposition Home     Diet: Heart Healthy   For Heart failure patients - Check your Weight same time everyday, if you gain over 2 pounds, or you develop in leg swelling, experience more shortness of breath or chest pain, call your Primary MD immediately. Follow Cardiac Low Salt Diet and 1.5 lit/day fluid restriction.   On your next visit with your primary care physician please Get Medicines reviewed and adjusted.   Please request your Prim.MD to go over all Hospital Tests and Procedure/Radiological results at the follow up, please get all Hospital records sent to your Prim MD by signing hospital release before you go home.   If you experience worsening of your admission symptoms, develop shortness of breath, life threatening emergency, suicidal or homicidal thoughts you must seek medical attention immediately by calling 911 or calling your MD immediately  if symptoms less severe.  You Must read complete instructions/literature along with all the possible adverse reactions/side effects for all the Medicines you take and that have been prescribed to you. Take any new Medicines after you have completely understood and accpet all the possible adverse reactions/side effects.   Do not drive, operating heavy machinery, perform activities at heights, swimming or participation in water activities or provide baby sitting services if your were admitted for syncope or siezures until you have seen by Primary MD or a Neurologist and advised to do so again.  Do not drive when taking Pain medications.    Do not take more than prescribed Pain, Sleep and Anxiety Medications  Special Instructions: If you have smoked or chewed Tobacco  in the last 2 yrs please stop smoking, stop any  regular Alcohol  and or any Recreational drug use.  Wear Seat belts while driving.   Please note  You were cared for by a hospitalist during your hospital stay. If you have any questions about your discharge medications or the care you received while you were in the hospital after you are discharged, you can call the unit and asked to speak with the hospitalist on call if the hospitalist that took care of you is not available. Once you are discharged, your primary care physician will handle any further medical issues. Please note that NO REFILLS for any discharge medications will be authorized once you are discharged, as it is imperative that you return to your primary care physician (or establish a relationship with a primary care physician if you do not have one) for your aftercare needs so that they can reassess your need for medications and monitor your lab values.

## 2015-03-30 ENCOUNTER — Encounter: Payer: Self-pay | Admitting: Cardiology

## 2015-03-30 ENCOUNTER — Ambulatory Visit (INDEPENDENT_AMBULATORY_CARE_PROVIDER_SITE_OTHER): Payer: PPO | Admitting: Cardiology

## 2015-03-30 VITALS — BP 150/80 | HR 68 | Ht 63.0 in | Wt 113.0 lb

## 2015-03-30 DIAGNOSIS — I481 Persistent atrial fibrillation: Secondary | ICD-10-CM

## 2015-03-30 DIAGNOSIS — I4819 Other persistent atrial fibrillation: Secondary | ICD-10-CM

## 2015-03-30 NOTE — Progress Notes (Signed)
HPI The patient presents for followup of atrial fib.  Since I last saw her she was hospitalized for some GI problems. The etiology of her complaints was not clear that they eventually resolved. I reviewed these records. There were no acute cardiac issues. She did have her Xarelto dose appropriately reduced for her age and weight during that admission. She was in persistent but rate controlled atrial fibrillation. Since then she has done well. The patient denies any new symptoms such as chest discomfort, neck or arm discomfort. There has been no new shortness of breath, PND or orthopnea. There have been no reported palpitations, presyncope or syncope.  When I saw her previously she had some dyspnea but this was improved.  Allergies  Allergen Reactions  . Sulfa Antibiotics Other (See Comments)    unknown    Current Outpatient Prescriptions  Medication Sig Dispense Refill  . amLODipine (NORVASC) 10 MG tablet Take 10 mg by mouth every evening.     Marland Kitchen. atorvastatin (LIPITOR) 10 MG tablet Take 10 mg by mouth every evening.     . Calcium Carbonate-Vitamin D 600-200 MG-UNIT TABS Take 1 tablet by mouth daily.    . carbamide peroxide (DEBROX) 6.5 % otic solution Place 5 drops into both ears 2 (two) times daily. (Patient taking differently: Place 5 drops into both ears once a week. ) 15 mL 0  . docusate sodium (COLACE) 100 MG capsule Take 1 capsule (100 mg total) by mouth daily. 30 capsule 1  . esomeprazole (NEXIUM) 20 MG capsule Take 20 mg by mouth 2 (two) times daily before a meal.     . Grape Seed Extract 100 MG CAPS Take 100 mg by mouth daily.     Marland Kitchen. losartan (COZAAR) 25 MG tablet Take 50 mg by mouth every evening.     . metoprolol tartrate (LOPRESSOR) 25 MG tablet Take 1 tablet (25 mg total) by mouth 2 (two) times daily. 60 tablet 0  . Multiple Vitamin (MULTIVITAMIN WITH MINERALS) TABS tablet Take 1 tablet by mouth daily.    . polyethylene glycol (MIRALAX / GLYCOLAX) packet Take 17 g by mouth daily  as needed for mild constipation. 30 packet 1  . psyllium (METAMUCIL) 58.6 % packet Take 1 packet by mouth daily.    . Rivaroxaban (XARELTO) 15 MG TABS tablet Take 1 tablet (15 mg total) by mouth every evening. 30 tablet 2   No current facility-administered medications for this visit.    Past Medical History  Diagnosis Date  . Hypertension   . GERD (gastroesophageal reflux disease)   . Atrial fibrillation   . Stroke     Past Surgical History  Procedure Laterality Date  . Abdominal hysterectomy      ROS:  As stated in the HPI and negative for all other systems.  PHYSICAL EXAM BP 150/80 mmHg  Pulse 68  Ht 5\' 3"  (1.6 m)  Wt 113 lb (51.256 kg)  BMI 20.02 kg/m2 GENERAL:  Well appearing for her age NECK:  No jugular venous distention, waveform within normal limits, carotid upstroke brisk and symmetric, no bruits, no thyromegaly LUNGS:  Clear to auscultation bilaterally BACK:  No CVA tenderness, mild lordosis.  CHEST:  Unremarkable HEART:  PMI not displaced or sustained,S1 and S2 within normal limits, no S3,  no clicks, no rubs, no murmurs, irregular ABD:  Flat, positive bowel sounds normal in frequency in pitch, no bruits, no rebound, no guarding, no midline pulsatile mass, no hepatomegaly, no splenomegaly EXT:  2 plus  pulses throughout, no edema, no cyanosis no clubbing   ASSESSMENT AND PLAN  ATRIAL FIBRILLATION:  She does not notice that she is atrial fib.  For now she will continue the meds as listed.  Theresa Kennedy has a CHA2DS2 - VASc score of 6.  HTN:  Her blood pressure is unusually elevated at the doctors office but not at home.   She kept a BP diary.  She will let me know if it runs elevated.

## 2015-03-30 NOTE — Patient Instructions (Addendum)
Your physician recommends that you continue on your current medications as directed. Please refer to the Current Medication list given to you today.  Follow up in 6 months with Dr. Hochrein in [place].  You will receive a letter in the mail 2 months before you are due.  Please call us when you receive this letter to schedule your follow up appointment.  Thank you for choosing [facility]!!      

## 2015-11-20 ENCOUNTER — Encounter (HOSPITAL_COMMUNITY): Payer: Self-pay | Admitting: *Deleted

## 2015-11-20 ENCOUNTER — Emergency Department (HOSPITAL_COMMUNITY): Payer: Medicare HMO

## 2015-11-20 ENCOUNTER — Observation Stay (HOSPITAL_COMMUNITY)
Admission: EM | Admit: 2015-11-20 | Discharge: 2015-11-21 | Disposition: A | Discharge status: Home / Self Care | Payer: Medicare HMO | Attending: Internal Medicine | Admitting: Internal Medicine

## 2015-11-20 DIAGNOSIS — Z79899 Other long term (current) drug therapy: Secondary | ICD-10-CM | POA: Insufficient documentation

## 2015-11-20 DIAGNOSIS — Z7901 Long term (current) use of anticoagulants: Secondary | ICD-10-CM | POA: Diagnosis not present

## 2015-11-20 DIAGNOSIS — I10 Essential (primary) hypertension: Secondary | ICD-10-CM | POA: Diagnosis present

## 2015-11-20 DIAGNOSIS — E785 Hyperlipidemia, unspecified: Secondary | ICD-10-CM | POA: Diagnosis present

## 2015-11-20 DIAGNOSIS — K219 Gastro-esophageal reflux disease without esophagitis: Secondary | ICD-10-CM | POA: Diagnosis not present

## 2015-11-20 DIAGNOSIS — G459 Transient cerebral ischemic attack, unspecified: Principal | ICD-10-CM | POA: Diagnosis present

## 2015-11-20 DIAGNOSIS — I4819 Other persistent atrial fibrillation: Secondary | ICD-10-CM | POA: Diagnosis present

## 2015-11-20 DIAGNOSIS — I481 Persistent atrial fibrillation: Secondary | ICD-10-CM | POA: Diagnosis not present

## 2015-11-20 DIAGNOSIS — Z8673 Personal history of transient ischemic attack (TIA), and cerebral infarction without residual deficits: Secondary | ICD-10-CM | POA: Insufficient documentation

## 2015-11-20 DIAGNOSIS — E871 Hypo-osmolality and hyponatremia: Secondary | ICD-10-CM | POA: Insufficient documentation

## 2015-11-20 DIAGNOSIS — I639 Cerebral infarction, unspecified: Secondary | ICD-10-CM | POA: Diagnosis present

## 2015-11-20 DIAGNOSIS — R4781 Slurred speech: Secondary | ICD-10-CM | POA: Diagnosis not present

## 2015-11-20 DIAGNOSIS — G458 Other transient cerebral ischemic attacks and related syndromes: Secondary | ICD-10-CM

## 2015-11-20 LAB — I-STAT CHEM 8, ED
BUN: 21 mg/dL — ABNORMAL HIGH (ref 6–20)
Calcium, Ion: 1.13 mmol/L (ref 1.13–1.30)
Chloride: 95 mmol/L — ABNORMAL LOW (ref 101–111)
Creatinine, Ser: 0.8 mg/dL (ref 0.44–1.00)
Glucose, Bld: 114 mg/dL — ABNORMAL HIGH (ref 65–99)
HEMATOCRIT: 42 % (ref 36.0–46.0)
HEMOGLOBIN: 14.3 g/dL (ref 12.0–15.0)
Potassium: 4.2 mmol/L (ref 3.5–5.1)
SODIUM: 133 mmol/L — AB (ref 135–145)
TCO2: 27 mmol/L (ref 0–100)

## 2015-11-20 LAB — I-STAT TROPONIN, ED: TROPONIN I, POC: 0 ng/mL (ref 0.00–0.08)

## 2015-11-20 LAB — COMPREHENSIVE METABOLIC PANEL
ALBUMIN: 4.1 g/dL (ref 3.5–5.0)
ALT: 16 U/L (ref 14–54)
AST: 31 U/L (ref 15–41)
Alkaline Phosphatase: 56 U/L (ref 38–126)
Anion gap: 9 (ref 5–15)
BILIRUBIN TOTAL: 0.6 mg/dL (ref 0.3–1.2)
BUN: 19 mg/dL (ref 6–20)
CO2: 26 mmol/L (ref 22–32)
Calcium: 9.6 mg/dL (ref 8.9–10.3)
Chloride: 98 mmol/L — ABNORMAL LOW (ref 101–111)
Creatinine, Ser: 0.97 mg/dL (ref 0.44–1.00)
GFR calc Af Amer: 57 mL/min — ABNORMAL LOW (ref >60)
GFR calc non Af Amer: 49 mL/min — ABNORMAL LOW (ref >60)
GLUCOSE: 120 mg/dL — AB (ref 65–99)
POTASSIUM: 4.5 mmol/L (ref 3.5–5.1)
SODIUM: 133 mmol/L — AB (ref 135–145)
Total Protein: 6.6 g/dL (ref 6.5–8.1)

## 2015-11-20 LAB — PROTIME-INR
INR: 1.08 (ref 0.00–1.49)
Prothrombin Time: 14.2 seconds (ref 11.6–15.2)

## 2015-11-20 LAB — DIFFERENTIAL
BASOS ABS: 0 10*3/uL (ref 0.0–0.1)
Basophils Relative: 0 %
EOS ABS: 0.1 10*3/uL (ref 0.0–0.7)
Eosinophils Relative: 2 %
Lymphocytes Relative: 33 %
Lymphs Abs: 1.6 10*3/uL (ref 0.7–4.0)
Monocytes Absolute: 0.7 10*3/uL (ref 0.1–1.0)
Monocytes Relative: 15 %
NEUTROS PCT: 50 %
Neutro Abs: 2.4 10*3/uL (ref 1.7–7.7)

## 2015-11-20 LAB — CBC
HCT: 35.5 % — ABNORMAL LOW (ref 36.0–46.0)
Hemoglobin: 12.3 g/dL (ref 12.0–15.0)
MCH: 32.6 pg (ref 26.0–34.0)
MCHC: 34.6 g/dL (ref 30.0–36.0)
MCV: 94.2 fL (ref 78.0–100.0)
Platelets: 146 10*3/uL — ABNORMAL LOW (ref 150–400)
RBC: 3.77 MIL/uL — AB (ref 3.87–5.11)
RDW: 13.2 % (ref 11.5–15.5)
WBC: 4.7 10*3/uL (ref 4.0–10.5)

## 2015-11-20 LAB — APTT: aPTT: 33 seconds (ref 24–37)

## 2015-11-20 NOTE — ED Notes (Signed)
Pt to ED from home c/o stroke like symptoms. Pt was talking on the phone around 1700 tonight when she was having slurred speech and difficulty speaking. Hx of CVA in 2014, no deficits from previous stroke. No neuro deficits on assessment

## 2015-11-20 NOTE — H&P (Signed)
PCP: [name],[name], MD  Cardiology [facility]  [facility]   Chief Complaint: Unable to speak  HPI: Carleene CooperBessie E Norbeck is a 82 y.o. female   has a past medical history of Hypertension; GERD (gastroesophageal reflux disease); Atrial fibrillation (HCC); and Stroke (HCC).   Presented with inability to speak at around 1700 tonight when she was on the phone developed slurred speech. Patient has already history of stroke in 2014  Before at that time she also developed trouble speaking, fully recovered no deficits. Patient denies any trouble walking no weakness or numbness no tingling. Currently symptoms completely resolved. Altogether lasted about an hour.  Dr. Amada JupiterKirkpatrick with Neurology has been consulted and is aware.  Patient has no history of atrial fibrillation on Xarelto currently rate controlled. Followed by cardiology She denies any fever, no urinary urgency, no cough no chest pain. Reports good Po intake, denies palpitations, no syncope.   Hospitalist was called for admission for TIA  Review of Systems:    Pertinent positives include:slurred speech and aphasia  Constitutional:  No weight loss, night sweats, Fevers, chills, fatigue, weight loss  HEENT:  No headaches, Difficulty swallowing,Tooth/dental problems,Sore throat,  No sneezing, itching, ear ache, nasal congestion, post nasal drip,  Cardio-vascular:  No chest pain, Orthopnea, PND, anasarca, dizziness, palpitations.no Bilateral lower extremity swelling  GI:  No heartburn, indigestion, abdominal pain, nausea, vomiting, diarrhea, change in bowel habits, loss of appetite, melena, blood in stool, hematemesis Resp:  no shortness of breath at rest. No dyspnea on exertion, No excess mucus, no productive cough, No non-productive cough, No coughing up of blood.No change in color of mucus.No wheezing. Skin:  no rash or lesions. No jaundice GU:  no dysuria, change in color of urine, no urgency  or frequency. No straining to urinate.  No flank pain.  Musculoskeletal:  No joint pain or no joint swelling. No decreased range of motion. No back pain.  Psych:  No change in mood or affect. No depression or anxiety. No memory loss.  Neuro: no localizing neurological complaints, no tingling, no weakness, no double vision, no gait abnormality, no slurred speech, no confusion  Otherwise ROS are negative except for above, 10 systems were reviewed  Past Medical History: Past Medical History  Diagnosis Date  . Hypertension   . GERD (gastroesophageal reflux disease)   . Atrial fibrillation (HCC)   . Stroke [facility](HCC)    Past Surgical History  Procedure Laterality Date  . Abdominal hysterectomy       Medications: Prior to Admission medications   Medication Sig Start Date End Date Taking? Authorizing Provider  amLODipine (NORVASC) 10 MG tablet Take 10 mg by mouth every evening.    Yes Historical Provider, MD  Calcium Carbonate-Vitamin D 600-200 MG-UNIT TABS Take 1 tablet by mouth daily.   Yes Historical Provider, MD  dexlansoprazole (DEXILANT) 60 MG capsule Take 60 mg by mouth daily.   Yes Historical Provider, MD  docusate sodium (COLACE) 100 MG capsule Take 1 capsule (100 mg total) by mouth daily. 02/16/15  Yes Leroy SeaPrashant K Singh, MD  Grape Seed Extract 100 MG CAPS Take 100 mg by mouth daily.    Yes Historical Provider, MD  losartan (COZAAR) 25 MG tablet Take 50 mg by mouth every evening.  07/19/14  Yes Rollene RotundaJames Hochrein, MD  metoprolol succinate (TOPROL-XL) 25 MG 24 hr tablet Take 25 mg by mouth 2 (two) times daily.   Yes Historical Provider, MD  metoprolol tartrate (LOPRESSOR) 25 MG tablet Take  1 tablet (25 mg total) by mouth 2 (two) times daily. 02/16/15  Yes Leroy Sea, MD  Multiple Vitamin (MULTIVITAMIN WITH MINERALS) TABS tablet Take 1 tablet by mouth daily.   Yes Historical Provider, MD  psyllium (METAMUCIL) 58.6 % packet Take 1 packet by mouth daily.   Yes Historical Provider, MD    Rivaroxaban (XARELTO) 15 MG TABS tablet Take 1 tablet (15 mg total) by mouth every evening. 02/16/15  Yes Leroy Sea, MD  carbamide peroxide (DEBROX) 6.5 % otic solution Place 5 drops into both ears 2 (two) times daily. Patient taking differently: Place 5 drops into both ears once a week.  02/16/15   Leroy Sea, MD  polyethylene glycol (MIRALAX / GLYCOLAX) packet Take 17 g by mouth daily as needed for mild constipation. 02/16/15   Leroy Sea, MD    Allergies:   Allergies  Allergen Reactions  . Sulfa Antibiotics Other (See Comments)    unknown    Social History:  Ambulatory idependently  Lives at home alone,       reports that she has never smoked. She does not have any smokeless tobacco history on file. She reports that she does not drink alcohol or use illicit drugs.     Family History: family history includes Breast cancer in her sister; Hypertension in her father; Leukemia in her brother; Stroke in her mother.    Physical Exam: Patient Vitals for the past 24 hrs:  BP Temp Temp src Pulse Resp SpO2  11/20/15 2200 131/55 mmHg - - 60 19 97 %  11/20/15 2130 151/64 mmHg - - 69 19 97 %  11/20/15 2100 146/75 mmHg - - (!) 57 14 99 %  11/20/15 1945 169/89 mmHg 97.5 F (36.4 C) Oral 82 18 97 %    1. General:  in No Acute distress 2. Psychological: Alert and  Oriented 3. Head/ENT:   Moist  Mucous Membranes                          Head Non traumatic, neck supple                          Normal  Dentition 4. SKIN:  ecreased Skin turgor,  Skin clean Dry and intact no rash 5. Heart: Regular rate and rhythm no Murmur, Rub or gallop 6. Lungs: Clear to auscultation bilaterally, no wheezes or crackles   7. Abdomen: Soft, non-tender, Non distended 8. Lower extremities: no clubbing, cyanosis, or edema 9. Neurologically strength 5 out of 5 in all 4 extremities cranial nerves II through XII intact 10. MSK: Normal range of motion  body mass index is unknown because there  is no weight on file.   Labs on Admission:   Results for orders placed or performed during the hospital encounter of 11/20/15 (from the past 24 hour(s))  Protime-INR     Status: None   Collection Time: 11/20/15  7:45 PM  Result Value Ref Range   Prothrombin Time 14.2 11.6 - 15.2 seconds   INR 1.08 0.00 - 1.49  APTT     Status: None   Collection Time: 11/20/15  7:45 PM  Result Value Ref Range   aPTT 33 24 - 37 seconds  CBC     Status: Abnormal   Collection Time: 11/20/15  7:45 PM  Result Value Ref Range   WBC 4.7 4.0 - 10.5 K/uL   RBC 3.77 (  L) 3.87 - 5.11 MIL/uL   Hemoglobin 12.3 12.0 - 15.0 g/dL   HCT 16.1 (L) 09.6 - 04.5 %   MCV 94.2 78.0 - 100.0 fL   MCH 32.6 26.0 - 34.0 pg   MCHC 34.6 30.0 - 36.0 g/dL   RDW 40.9 81.1 - 91.4 %   Platelets 146 (L) 150 - 400 K/uL  Differential     Status: None   Collection Time: 11/20/15  7:45 PM  Result Value Ref Range   Neutrophils Relative % 50 %   Neutro Abs 2.4 1.7 - 7.7 K/uL   Lymphocytes Relative 33 %   Lymphs Abs 1.6 0.7 - 4.0 K/uL   Monocytes Relative 15 %   Monocytes Absolute 0.7 0.1 - 1.0 K/uL   Eosinophils Relative 2 %   Eosinophils Absolute 0.1 0.0 - 0.7 K/uL   Basophils Relative 0 %   Basophils Absolute 0.0 0.0 - 0.1 K/uL  Comprehensive metabolic panel     Status: Abnormal   Collection Time: 11/20/15  7:45 PM  Result Value Ref Range   Sodium 133 (L) 135 - 145 mmol/L   Potassium 4.5 3.5 - 5.1 mmol/L   Chloride 98 (L) 101 - 111 mmol/L   CO2 26 22 - 32 mmol/L   Glucose, Bld 120 (H) 65 - 99 mg/dL   BUN 19 6 - 20 mg/dL   Creatinine, Ser 7.82 0.44 - 1.00 mg/dL   Calcium 9.6 8.9 - 95.6 mg/dL   Total Protein 6.6 6.5 - 8.1 g/dL   Albumin 4.1 3.5 - 5.0 g/dL   AST 31 15 - 41 U/L   ALT 16 14 - 54 U/L   Alkaline Phosphatase 56 38 - 126 U/L   Total Bilirubin 0.6 0.3 - 1.2 mg/dL   GFR calc non Af Amer 49 (L) >60 mL/min   GFR calc Af Amer 57 (L) >60 mL/min   Anion gap 9 5 - 15  I-stat troponin, ED (not at [facility], [facility])      Status: None   Collection Time: 11/20/15  7:58 PM  Result Value Ref Range   Troponin i, poc 0.00 0.00 - 0.08 ng/mL   Comment 3          I-Stat Chem 8, ED  (not at [facility], [facility])     Status: Abnormal   Collection Time: 11/20/15  7:59 PM  Result Value Ref Range   Sodium 133 (L) 135 - 145 mmol/L   Potassium 4.2 3.5 - 5.1 mmol/L   Chloride 95 (L) 101 - 111 mmol/L   BUN 21 (H) 6 - 20 mg/dL   Creatinine, Ser 2.13 0.44 - 1.00 mg/dL   Glucose, Bld 086 (H) 65 - 99 mg/dL   Calcium, Ion 5.78 4.69 - 1.30 mmol/L   TCO2 27 0 - 100 mmol/L   Hemoglobin 14.3 12.0 - 15.0 g/dL   HCT 62.9 52.8 - 41.3 %    UA ordered  Lab Results  Component Value Date   HGBA1C 5.3 04/20/2013    CrCl cannot be calculated (Unknown ideal weight.).  BNP (last 3 results) No results for input(s): PROBNP in the last 8760 hours.  Other results:  I have pearsonaly reviewed this: ECG REPORT  Rate: 72  Rhythm: Atrial fibrillation with fascicular block ST&T Change: No ischemic changes QTC 466  There were no vitals filed for this visit.   Cultures: No results found for: SDES, SPECREQUEST, CULT, REPTSTATUS   Radiological Exams on Admission: Ct Head Wo Contrast  11/20/2015  CLINICAL DATA:  Patient with difficulty speaking. EXAM: CT HEAD WITHOUT CONTRAST TECHNIQUE: Contiguous axial images were obtained from the base of the skull through the vertex without intravenous contrast. COMPARISON:  Brain CT 11/21/2014 FINDINGS: Ventricles and sulci are prominent compatible with atrophy. No evidence for acute cortically based infarct, intracranial hemorrhage, mass lesion or mass effect. Hypodensity within the right frontal lobe likely sequelae of prior infarct. Periventricular and subcortical white matter hypodensity compatible with chronic small vessel ischemic changes. Mastoid air cells are unremarkable. Calvarium is intact. Paranasal sinuses are well aerated. IMPRESSION: No acute intracranial process. Remote right frontal lobe  infarct. Chronic small vessel ischemic changes and cortical atrophy. Electronically Signed   By: Annia Belt M.D.   On: 11/20/2015 20:50    Chart has been reviewed  Family   at  Bedside    Assessment/Plan  82 year old female with history of CVA in 2014 with atrial fibrillation on chronic anticoagulation presents with transient slurred speech and aphasia currently resolved worrisome for TIA  Present on Admission:  . TIA (transient ischemic attack) -  - will admit based on TIA/CVA protocol, await results of MRA/MRI, Carotid Doppler and Echo, obtain cardiac enzymes,  ECG,   Lipid panel, TSH. Order PT/OT evaluation. Will make sure patient is on antiplatelet agent.   Neurology consult.      Marland Kitchen HTN (hypertension) allow permissive hypertension  . Atrial fibrillation, persistent (HCC) currently rate control continue home medications continue anticoagulation  . Slurred speech speech pathology evaluation  . HLD (hyperlipidemia) continue home medications check lipid panel  . GERD (gastroesophageal reflux disease) stable continue to monitor continue home medications    Prophylaxis: SCD   CODE STATUS:  FULL CODE as per patient    Disposition:    To home once workup is complete and patient is stable  Other plan as per orders.  I have spent a total of 50 min on this admission  Keelee Yankey 11/20/2015, 11:57 PM    [facility]  Pager [phone]   after 2 AM please page floor coverage PA If 7AM-7PM, please contact the day team taking care of the patient  [email or web address]  Password [email or web address]

## 2015-11-20 NOTE — ED Provider Notes (Signed)
CSN: [id number]     Arrival date & time 11/20/15  1933 History   First MD Initiated Contact with Patient 11/20/15 1934     Chief Complaint  Patient presents with  . Transient Ischemic Attack      HPI Patient presents to the emergency department complaining of approximately one hour of difficulty with speech.  She states she was unable to get her words out and this was extremely frustrating to her.  She denied symptoms in her arms or legs.  She was on the phone with a relative who noticed this.  She brought to the emergency department for strokelike symptoms.  Her symptoms lasted for an hour and completely resolved.   Past Medical History  Diagnosis Date  . Hypertension   . GERD (gastroesophageal reflux disease)   . Atrial fibrillation (HCC)   . Stroke [facility])    Past Surgical History  Procedure Laterality Date  . Abdominal hysterectomy     Family History  Problem Relation Age of Onset  . Stroke Mother   . Leukemia Brother   . Breast cancer Sister   . Hypertension Father    Social History  Substance Use Topics  . Smoking status: Never Smoker   . Smokeless tobacco: None  . Alcohol Use: No   OB History    No data available     Review of Systems  All other systems reviewed and are negative.     Allergies  Sulfa antibiotics  Home Medications   Prior to Admission medications   Medication Sig Start Date End Date Taking? Authorizing Provider  amLODipine (NORVASC) 10 MG tablet Take 10 mg by mouth every evening.    Yes Historical Provider, MD  Calcium Carbonate-Vitamin D 600-200 MG-UNIT TABS Take 1 tablet by mouth daily.   Yes Historical Provider, MD  dexlansoprazole (DEXILANT) 60 MG capsule Take 60 mg by mouth daily.   Yes Historical Provider, MD  docusate sodium (COLACE) 100 MG capsule Take 1 capsule (100 mg total) by mouth daily. 02/16/15  Yes Leroy Sea, MD  Grape Seed Extract 100 MG CAPS Take 100 mg by mouth daily.    Yes Historical Provider, MD  losartan  (COZAAR) 25 MG tablet Take 50 mg by mouth every evening.  07/19/14  Yes Rollene Rotunda, MD  metoprolol succinate (TOPROL-XL) 25 MG 24 hr tablet Take 25 mg by mouth 2 (two) times daily.   Yes Historical Provider, MD  metoprolol tartrate (LOPRESSOR) 25 MG tablet Take 1 tablet (25 mg total) by mouth 2 (two) times daily. 02/16/15  Yes Leroy Sea, MD  Multiple Vitamin (MULTIVITAMIN WITH MINERALS) TABS tablet Take 1 tablet by mouth daily.   Yes Historical Provider, MD  psyllium (METAMUCIL) 58.6 % packet Take 1 packet by mouth daily.   Yes Historical Provider, MD  Rivaroxaban (XARELTO) 15 MG TABS tablet Take 1 tablet (15 mg total) by mouth every evening. 02/16/15  Yes Leroy Sea, MD  carbamide peroxide (DEBROX) 6.5 % otic solution Place 5 drops into both ears 2 (two) times daily. Patient taking differently: Place 5 drops into both ears once a week.  02/16/15   Leroy Sea, MD  polyethylene glycol (MIRALAX / GLYCOLAX) packet Take 17 g by mouth daily as needed for mild constipation. 02/16/15   Leroy Sea, MD   BP 151/64 mmHg  Pulse 69  Temp(Src) 97.5 F (36.4 C) (Oral)  Resp 19  SpO2 97% Physical Exam  Constitutional: She is oriented to person, place,  and time. She appears well-developed and well-nourished. No distress.  HENT:  Head: Normocephalic and atraumatic.  Eyes: EOM are normal. Pupils are equal, round, and reactive to light.  Neck: Normal range of motion.  Cardiovascular: Normal rate, regular rhythm and normal heart sounds.   Pulmonary/Chest: Effort normal and breath sounds normal.  Abdominal: Soft. She exhibits no distension. There is no tenderness.  Musculoskeletal: Normal range of motion.  Neurological: She is alert and oriented to person, place, and time.  5/5 strength in major muscle groups of  bilateral upper and lower extremities. Speech normal. No facial asymetry.   Skin: Skin is warm and dry.  Psychiatric: She has a normal mood and affect. Judgment normal.   Nursing note and vitals reviewed.   ED Course  Procedures (including critical care time) Labs Review Labs Reviewed  CBC - Abnormal; Notable for the following:    RBC 3.77 (*)    HCT 35.5 (*)    Platelets 146 (*)    All other components within normal limits  COMPREHENSIVE METABOLIC PANEL - Abnormal; Notable for the following:    Sodium 133 (*)    Chloride 98 (*)    Glucose, Bld 120 (*)    GFR calc non Af Amer 49 (*)    GFR calc Af Amer 57 (*)    All other components within normal limits  I-STAT CHEM 8, ED - Abnormal; Notable for the following:    Sodium 133 (*)    Chloride 95 (*)    BUN 21 (*)    Glucose, Bld 114 (*)    All other components within normal limits  PROTIME-INR  APTT  DIFFERENTIAL  I-STAT TROPOININ, ED    Imaging Review Ct Head Wo Contrast  11/20/2015  CLINICAL DATA:  Patient with difficulty speaking. EXAM: CT HEAD WITHOUT CONTRAST TECHNIQUE: Contiguous axial images were obtained from the base of the skull through the vertex without intravenous contrast. COMPARISON:  Brain CT 11/21/2014 FINDINGS: Ventricles and sulci are prominent compatible with atrophy. No evidence for acute cortically based infarct, intracranial hemorrhage, mass lesion or mass effect. Hypodensity within the right frontal lobe likely sequelae of prior infarct. Periventricular and subcortical white matter hypodensity compatible with chronic small vessel ischemic changes. Mastoid air cells are unremarkable. Calvarium is intact. Paranasal sinuses are well aerated. IMPRESSION: No acute intracranial process. Remote right frontal lobe infarct. Chronic small vessel ischemic changes and cortical atrophy. Electronically Signed   By: Annia Beltrew  Davis M.D.   On: 11/20/2015 20:50   I have personally reviewed and evaluated these images and lab results as part of my medical decision-making.   EKG Interpretation   Date/Time:  Sunday November 20 2015 19:41:07 EST Ventricular Rate:  72 PR Interval:    QRS Duration:  97 QT Interval:  426 QTC Calculation: 466 R Axis:   -74 Text Interpretation:  Atrial fibrillation Left anterior fascicular block  Abnormal R-wave progression, early transition Probable left ventricular  hypertrophy Anterior Q waves, possibly due to LVH No significant change  was found Confirmed by Evalina Tabak  MD, Nancy Manuele ([id number]) on 11/20/2015 10:10:55 PM      MDM   Final diagnoses:  Transient cerebral ischemia, unspecified transient cerebral ischemia type    Patient with TIA.  Resolution of all symptoms.  Normal mental status now.  Patient will be admitted the hospital.    Azalia BilisKevin Rola Lennon, MD 11/20/15 2310

## 2015-11-20 NOTE — Consult Note (Addendum)
Neurology Consultation Reason for Consult: TIA Referring Physician: Patria Mane, K  CC: Transient difficulty speaking  History is obtained from: Patient, family  HPI: Theresa Kennedy is a 82 y.o. female with a history of previous stroke causing trouble speaking who presents with transient difficulty speaking that happened earlier tonight. She denies any trouble with her arms or legs. She states that she was on the phone and had difficulty speaking and slurred speech which then resolved or the course of about an hour.  The temporary resolved by the time of arrival to the emergency room  LKW: 5 PM tpa given?: no, result symptoms    ROS: A 14 point ROS was performed and is negative except as noted in the HPI.   Past Medical History  Diagnosis Date  . Hypertension   . GERD (gastroesophageal reflux disease)   . Atrial fibrillation (HCC)   . Stroke [facility])      Family History  Problem Relation Age of Onset  . Stroke Mother   . Leukemia Brother   . Breast cancer Sister   . Hypertension Father      Social History:  reports that she has never smoked. She does not have any smokeless tobacco history on file. She reports that she does not drink alcohol or use illicit drugs.   Exam: Current vital signs: BP 131/55 mmHg  Pulse 60  Temp(Src) 97.5 F (36.4 C) (Oral)  Resp 19  SpO2 97% Vital signs in last 24 hours: Temp:  [97.5 F (36.4 C)] 97.5 F (36.4 C) (01/01 1945) Pulse Rate:  [57-82] 60 (01/01 2200) Resp:  [14-19] 19 (01/01 2200) BP: (131-169)/(55-89) 131/55 mmHg (01/01 2200) SpO2:  [97 %-99 %] 97 % (01/01 2200)   Physical Exam  Constitutional: Appears well-developed and well-nourished.  Psych: Affect appropriate to situation Eyes: No scleral injection HENT: No OP obstrucion Head: Normocephalic.  Cardiovascular: Normal rate and regular rhythm.  Respiratory: Effort normal and breath sounds normal to anterior ascultation GI: Soft.  No distension. There is no tenderness.   Skin: WDI  Neuro: Mental Status: Patient is awake, alert, oriented to person, place, month, year, and situation. Patient is able to give a clear and coherent history. No signs of aphasia or neglect Cranial Nerves: II: Visual Fields are full. Pupils are equal, round, and reactive to light.   III,IV, VI: EOMI without ptosis or diploplia.  V: Facial sensation is symmetric to temperature VII: Facial movement is symmetric.  VIII: hearing is intact to voice X: Uvula elevates symmetrically XI: Shoulder shrug is symmetric. XII: tongue is midline without atrophy or fasciculations.  Motor: Tone is normal. Bulk is normal. 5/5 strength was present in all four extremities.  Sensory: Sensation is symmetric to light touch  in the arms and legs. Cerebellar: No clear ataxia         I have reviewed labs in epic and the results pertinent to this consultation are: CMP-unremarkable   I have reviewed the images obtained: CT head-remote right frontal lobe infarct  Impression: 82 year old female with transient aphasia consistent with TIA . She is anticoagulated with Lesia Hausen. She does have a significantly high ABCD score given the fact that she lives alone, I think it is reasonable to admit her for observation. I discussed carotid surgery with her and she indicated that she would unlikely pursued even if recommended, but would like to have all the information possible before making that decision.  Recommendations: 1. HgbA1c, fasting lipid panel 2. MRI, MRA  of the  brain without contrast 3. Frequent neuro checks 4. Echocardiogram 5. Carotid dopplers 6. Prophylactic therapy-Xaralto 7. Risk factor modification 8. Telemetry monitoring    Ritta SlotMcNeill Tramane Gorum, MD [facility] [phone]  If 7pm- 7am, please page neurology on call as listed in AMION.

## 2015-11-20 NOTE — ED Notes (Signed)
Pt given turkey sandwich

## 2015-11-21 ENCOUNTER — Observation Stay (HOSPITAL_BASED_OUTPATIENT_CLINIC_OR_DEPARTMENT_OTHER): Payer: Medicare HMO

## 2015-11-21 ENCOUNTER — Observation Stay (HOSPITAL_COMMUNITY): Payer: Medicare HMO

## 2015-11-21 DIAGNOSIS — I6789 Other cerebrovascular disease: Secondary | ICD-10-CM | POA: Diagnosis not present

## 2015-11-21 DIAGNOSIS — I481 Persistent atrial fibrillation: Secondary | ICD-10-CM | POA: Diagnosis not present

## 2015-11-21 DIAGNOSIS — G451 Carotid artery syndrome (hemispheric): Secondary | ICD-10-CM | POA: Diagnosis not present

## 2015-11-21 DIAGNOSIS — Z7901 Long term (current) use of anticoagulants: Secondary | ICD-10-CM

## 2015-11-21 DIAGNOSIS — K219 Gastro-esophageal reflux disease without esophagitis: Secondary | ICD-10-CM | POA: Diagnosis not present

## 2015-11-21 DIAGNOSIS — I10 Essential (primary) hypertension: Secondary | ICD-10-CM | POA: Diagnosis not present

## 2015-11-21 LAB — LIPID PANEL
CHOL/HDL RATIO: 1.9 ratio
CHOLESTEROL: 141 mg/dL (ref 0–200)
HDL: 76 mg/dL (ref >40)
LDL Cholesterol: 63 mg/dL (ref 0–99)
TRIGLYCERIDES: 11 mg/dL (ref <150)
VLDL: 2 mg/dL (ref 0–40)

## 2015-11-21 LAB — URINALYSIS, ROUTINE W REFLEX MICROSCOPIC
Bilirubin Urine: NEGATIVE
Glucose, UA: NEGATIVE mg/dL
Hgb urine dipstick: NEGATIVE
KETONES UR: NEGATIVE mg/dL
LEUKOCYTES UA: NEGATIVE
NITRITE: NEGATIVE
PH: 7.5 (ref 5.0–8.0)
Protein, ur: NEGATIVE mg/dL
SPECIFIC GRAVITY, URINE: 1.01 (ref 1.005–1.030)

## 2015-11-21 LAB — TROPONIN I

## 2015-11-21 MED ORDER — ASPIRIN 81 MG PO CHEW: 81.0000 mg | CHEWABLE_TABLET | Freq: Every day | ORAL | Status: DC

## 2015-11-21 MED ORDER — DOCUSATE SODIUM 100 MG PO CAPS
100.0000 mg | ORAL_CAPSULE | Freq: Every day | ORAL | Status: DC
  Administered 2015-11-21: 100 mg via ORAL
  Filled 2015-11-21: qty 1

## 2015-11-21 MED ORDER — ACETAMINOPHEN 325 MG PO TABS
650.0000 mg | ORAL_TABLET | ORAL | Status: DC | PRN
  Filled 2015-11-21: qty 2

## 2015-11-21 MED ORDER — APIXABAN 2.5 MG PO TABS: 2.5000 mg | ORAL_TABLET | Freq: Two times a day (BID) | ORAL | Status: AC

## 2015-11-21 MED ORDER — RIVAROXABAN 15 MG PO TABS
15.0000 mg | ORAL_TABLET | Freq: Every day | ORAL | Status: DC
  Administered 2015-11-21: 15 mg via ORAL
  Filled 2015-11-21: qty 1

## 2015-11-21 MED ORDER — SODIUM CHLORIDE 0.9 % IV SOLN
INTRAVENOUS | Status: DC
  Administered 2015-11-21: 03:00:00 via INTRAVENOUS

## 2015-11-21 MED ORDER — PANTOPRAZOLE SODIUM 40 MG PO TBEC
40.0000 mg | DELAYED_RELEASE_TABLET | Freq: Every day | ORAL | Status: DC
  Administered 2015-11-21: 40 mg via ORAL
  Filled 2015-11-21 (×2): qty 1

## 2015-11-21 MED ORDER — ACETAMINOPHEN 650 MG RE SUPP: 650.0000 mg | RECTAL | Status: DC | PRN

## 2015-11-21 MED ORDER — APIXABAN 2.5 MG PO TABS
2.5000 mg | ORAL_TABLET | Freq: Two times a day (BID) | ORAL | Status: DC
  Filled 2015-11-21 (×2): qty 1

## 2015-11-21 MED ORDER — STROKE: EARLY STAGES OF RECOVERY BOOK
Freq: Once | Status: DC
  Filled 2015-11-21: qty 1

## 2015-11-21 MED ORDER — METOPROLOL SUCCINATE ER 25 MG PO TB24
25.0000 mg | ORAL_TABLET | Freq: Two times a day (BID) | ORAL | Status: DC
  Administered 2015-11-21 (×2): 25 mg via ORAL
  Filled 2015-11-21 (×3): qty 1

## 2015-11-21 NOTE — Evaluation (Signed)
Physical Therapy Evaluation and Discharge Patient Details Name: Theresa Kennedy MRN: [id number] DOB: April 29, 1921 Today's Date: 11/21/2015   History of Present Illness  Brought to hospital due to period when she could not speak or words were unintelligible. MRI negative for acute infarct (remote Rt frontal infarct) PMHx-HTN, afib (anticoagulated), CVA    Clinical Impression  Patient evaluated by Physical Therapy with no further acute PT needs identified. Patient typically works out at [employer] three days/week. She is back to her baseline related to her balance/mobility.  PT is signing off. Thank you for this referral.     Follow Up Recommendations No PT follow up    Equipment Recommendations  None recommended by PT    Recommendations for Other Services       Precautions / Restrictions Precautions Precautions: None      Mobility  Bed Mobility Overal bed mobility: Independent             General bed mobility comments: including linens  Transfers Overall transfer level: Independent Equipment used: None             General transfer comment: bed and low toilet  Ambulation/Gait Ambulation/Gait assistance: Independent Ambulation Distance (Feet): 180 Feet Assistive device: None Gait Pattern/deviations: Step-through pattern;Decreased stride length;Trunk flexed;Shuffle   Gait velocity interpretation: at or above normal speed for age/gender    Stairs            Wheelchair Mobility    Modified Rankin (Stroke Patients Only) Modified Rankin (Stroke Patients Only) Pre-Morbid Rankin Score: No symptoms Modified Rankin: No symptoms     Balance Overall balance assessment: Independent         Standing balance support: No upper extremity supported Standing balance-Leahy Scale: Normal           Rhomberg - Eyes Opened: 15   High level balance activites: Other (comment) High Level Balance Comments: able to stand with eyes closed x 10 sec with minimal sway, pick  up item from floor, turn 360              Pertinent Vitals/Pain Pain Assessment: No/denies pain    Home Living Family/patient expects to be discharged to:: Private residence Living Arrangements: Alone   Type of Home: House Home Access: Stairs to enter Entrance Stairs-Rails: Right Entrance Stairs-Number of Steps: 2 Home Layout: One level Home Equipment: Grab bars - tub/shower;Grab bars - toilet;Cane - single point;Walker - 2 wheels      Prior Function Level of Independence: Independent         Comments: Independent with ADL's and runs her own errands.  Goes to [facility] 3 days per week. Recently broke her foot and used RW awhile. Housekeeper, assist with yard work.     Hand Dominance   Dominant Hand: Right    Extremity/Trunk Assessment   Upper Extremity Assessment: Defer to OT evaluation;Overall WFL for tasks assessed           Lower Extremity Assessment: Overall WFL for tasks assessed      Cervical / Trunk Assessment: Kyphotic (scoliosis)  Communication   Communication: No difficulties  Cognition Arousal/Alertness: Awake/alert Behavior During Therapy: WFL for tasks assessed/performed Overall Cognitive Status: Within Functional Limits for tasks assessed                      General Comments General comments (skin integrity, edema, etc.): Niece present and confirms pt does well at home    Exercises  Assessment/Plan    PT Assessment Patent does not need any further PT services  PT Diagnosis Difficulty walking   PT Problem List    PT Treatment Interventions     PT Goals (Current goals can be found in the Care Plan section) Acute Rehab PT Goals PT Goal Formulation: All assessment and education complete, DC therapy    Frequency     Barriers to discharge        Co-evaluation               End of Session   Activity Tolerance: Patient tolerated treatment well Patient left: in chair;with call bell/phone within reach;with  family/visitor present Nurse Communication: Mobility status;Other (comment) (independent with mobility)    Functional Assessment Tool Used: clinical judgement Functional Limitation: Mobility: Walking and moving around Mobility: Walking and Moving Around Current Status [phone]([id number]): 0 percent impaired, limited or restricted Mobility: Walking and Moving Around Goal Status [phone](G8979): 0 percent impaired, limited or restricted Mobility: Walking and Moving Around Discharge Status [phone](G8980): 0 percent impaired, limited or restricted    Time: 1259-1321 PT Time Calculation (min) (ACUTE ONLY): 22 min   Charges:   PT Evaluation $Initial PT Evaluation Tier I: 1 Procedure     PT G Codes:   PT G-Codes **NOT FOR INPATIENT CLASS** Functional Assessment Tool Used: clinical judgement Functional Limitation: Mobility: Walking and moving around Mobility: Walking and Moving Around Current Status ([phone](G8978): 0 percent impaired, limited or restricted Mobility: Walking and Moving Around Goal Status ([id number](G8979): 0 percent impaired, limited or restricted Mobility: Walking and Moving Around Discharge Status ([phone](G8980): 0 percent impaired, limited or restricted    Theresa Kennedy 11/21/2015, 1:34 PM Pager [phone]

## 2015-11-21 NOTE — Care Management Note (Signed)
Case Management Note  Patient Details  Name: Carleene CooperBessie E Jowett MRN: [id number] Date of Birth: 08/07/1921  Subjective/Objective:                    Action/Plan: Plan is to discharge patient home with self care. Patient has a niece at the bedside that helps her at home. MD ordered to change Xarelto to Eliquis 2.5mg  BID. Benefits check submitted. Patient given 30 day free card and explained to her about the benefits check and that it may not result until the am. Informed the patient that CM would call her and inform her of the price of the Eliquis at that time. CM informed her that if the medication is too expensive after the first 30 days or unable to price the medication for her she can follow up with Dr Denzil MagnusonNylan her PCP. Patient in agreement. Patient has recently changed insurance companies and new information sent via RN to admissions and for the benefits check. Bedside RN updated.   Expected Discharge Date:                  Expected Discharge Plan:  Home/Self Care  In-House Referral:     Discharge planning Services  CM Consult  Post Acute Care Choice:    Choice offered to:     DME Arranged:    DME Agency:     HH Arranged:    HH Agency:     Status of Service:  Completed, signed off  Medicare Important Message Given:    Date Medicare IM Given:    Medicare IM give by:    Date Additional Medicare IM Given:    Additional Medicare Important Message give by:     If discussed at Long Length of Stay Meetings, dates discussed:    Additional Comments:  Kermit BaloKelli F Benjamine Strout, RN 11/21/2015, 2:40 PM

## 2015-11-21 NOTE — Progress Notes (Signed)
Patient arrived to 5M06 at 0030 alert & oriented x4, ambulated to bathroom with standby assist.  Oriented to room and use of call light system. Will continue to monitor.

## 2015-11-21 NOTE — Discharge Summary (Signed)
PATIENT DETAILS Name: Theresa Kennedy Age: 82 y.o. Sex: female Date of Birth: 08/14/1921 MRN: [id number]. Admitting Physician: Therisa Doyne, MD WUJ:WJXBJY,NWGNFAO ROBERT, MD  Admit Date: 11/20/2015 Discharge date: 11/21/2015  Recommendations for Outpatient Follow-up:  1. New Medication-Eliquis-stopped Xarelto 2. A1C pending-please followl  3. Please repeat CBC/BMET at next visit  PRIMARY DISCHARGE DIAGNOSIS:  Principal Problem:   TIA (transient ischemic attack) Active Problems:   HTN (hypertension)   Atrial fibrillation, persistent (HCC)   Slurred speech   HLD (hyperlipidemia)   GERD (gastroesophageal reflux disease)   Stroke (cerebrum) (HCC)      PAST MEDICAL HISTORY: Past Medical History  Diagnosis Date  . Hypertension   . GERD (gastroesophageal reflux disease)   . Atrial fibrillation (HCC)   . Stroke [facility])     DISCHARGE MEDICATIONS: Current Discharge Medication List    START taking these medications   Details  apixaban (ELIQUIS) 2.5 MG TABS tablet Take 1 tablet (2.5 mg total) by mouth 2 (two) times daily. Qty: 120 tablet, Refills: 0      CONTINUE these medications which have NOT CHANGED   Details  amLODipine (NORVASC) 10 MG tablet Take 10 mg by mouth every evening.     Calcium Carbonate-Vitamin D 600-200 MG-UNIT TABS Take 1 tablet by mouth daily.    dexlansoprazole (DEXILANT) 60 MG capsule Take 60 mg by mouth daily.    docusate sodium (COLACE) 100 MG capsule Take 1 capsule (100 mg total) by mouth daily. Qty: 30 capsule, Refills: 1    Grape Seed Extract 100 MG CAPS Take 100 mg by mouth daily.     losartan (COZAAR) 25 MG tablet Take 50 mg by mouth every evening.     metoprolol succinate (TOPROL-XL) 25 MG 24 hr tablet Take 25 mg by mouth 2 (two) times daily.    Multiple Vitamin (MULTIVITAMIN WITH MINERALS) TABS tablet Take 1 tablet by mouth daily.    psyllium (METAMUCIL) 58.6 % packet Take 1 packet by mouth daily.    carbamide peroxide (DEBROX)  6.5 % otic solution Place 5 drops into both ears 2 (two) times daily. Qty: 15 mL, Refills: 0    polyethylene glycol (MIRALAX / GLYCOLAX) packet Take 17 g by mouth daily as needed for mild constipation. Qty: 30 packet, Refills: 1      STOP taking these medications     Rivaroxaban (XARELTO) 15 MG TABS tablet         ALLERGIES:   Allergies  Allergen Reactions  . Sulfa Antibiotics Other (See Comments)    unknown    BRIEF HPI:  See H&P, Labs, Consult and Test reports for all details in brief, patient  Is a 82 year old female with history of atrial fibrillation on anticoagulation-was brought in for evaluation of transient dysarthria.  CONSULTATIONS:   neurology  PERTINENT RADIOLOGIC STUDIES: Dg Chest 2 View  11/21/2015  CLINICAL DATA:  82 year old female with acute shortness of breath and weakness. EXAM: CHEST  2 VIEW COMPARISON:  02/12/2015 CT scout film. FINDINGS: The cardiomediastinal silhouette is unremarkable. A moderate hiatal hernia is again noted. There is no evidence of focal airspace disease, pulmonary edema, suspicious pulmonary nodule/mass, pleural effusion, or pneumothorax. No acute bony abnormalities are identified. IMPRESSION: No active cardiopulmonary disease. Moderate hiatal hernia. Electronically Signed   By: Harmon Pier M.D.   On: 11/21/2015 09:36   Ct Head Wo Contrast  11/20/2015  CLINICAL DATA:  Patient with difficulty speaking. EXAM: CT HEAD WITHOUT CONTRAST TECHNIQUE: Contiguous axial images were obtained from  the base of the skull through the vertex without intravenous contrast. COMPARISON:  Brain CT 11/21/2014 FINDINGS: Ventricles and sulci are prominent compatible with atrophy. No evidence for acute cortically based infarct, intracranial hemorrhage, mass lesion or mass effect. Hypodensity within the right frontal lobe likely sequelae of prior infarct. Periventricular and subcortical white matter hypodensity compatible with chronic small vessel ischemic changes.  Mastoid air cells are unremarkable. Calvarium is intact. Paranasal sinuses are well aerated. IMPRESSION: No acute intracranial process. Remote right frontal lobe infarct. Chronic small vessel ischemic changes and cortical atrophy. Electronically Signed   By: Annia Beltrew  Davis M.D.   On: 11/20/2015 20:50   Mr Brain Wo Contrast  11/21/2015  CLINICAL DATA:  82 year old hypertensive female with atrial fibrillation presenting with difficulty with speech. Subsequent encounter. EXAM: MRI HEAD WITHOUT CONTRAST MRA HEAD WITHOUT CONTRAST TECHNIQUE: Multiplanar, multiecho pulse sequences of the brain and surrounding structures were obtained without intravenous contrast. Angiographic images of the head were obtained using MRA technique without contrast. COMPARISON:  11/20/2015 CT.  04/20/2013 MR. FINDINGS: MRI HEAD FINDINGS No acute infarct. Remote right frontal lobe infarct. Moderate chronic small vessel disease type changes. No intracranial hemorrhage. No intracranial mass lesion noted on this unenhanced exam. Global atrophy without hydrocephalus. Major intracranial vascular structures are patent. Spinal stenosis with cord flattening C3-4 and C4-5 level. Post lens replacement otherwise orbital structures unremarkable. Cervical medullary junction, pituitary region and pineal region unremarkable. MRA HEAD FINDINGS Anterior circulation without medium or large size vessel significant stenosis or occlusion. Mild narrowing A1 segment right anterior cerebral artery. Mild middle cerebral artery branch vessel irregularity bilaterally. Ectatic vertebral arteries. Right vertebral artery is dominant. Mild narrowing distal left vertebral artery. Nonvisualized posterior inferior cerebellar arteries. Ectatic basilar artery with slight narrowing mid aspect. Narrowed irregular anterior inferior cerebral arteries more notable on the right. Mild irregularity superior cerebellar arteries. Fetal type contribution to the posterior cerebral arteries.  Posterior cerebral artery distal branch vessel narrowing or irregularity greater on the right. IMPRESSION: MRI HEAD No acute infarct. Remote right frontal lobe infarct. Moderate chronic small vessel disease type changes. Global atrophy without hydrocephalus. Spinal stenosis with cord flattening C3-4 and C4-5 level. MRA HEAD Intracranial atherosclerotic type changes greater posterior circulation and involving branch vessels as detailed above. Electronically Signed   By: Lacy DuverneySteven  Olson M.D.   On: 11/21/2015 11:42   Mr Maxine GlennMra Head/brain Wo Cm  11/21/2015  CLINICAL DATA:  82 year old hypertensive female with atrial fibrillation presenting with difficulty with speech. Subsequent encounter. EXAM: MRI HEAD WITHOUT CONTRAST MRA HEAD WITHOUT CONTRAST TECHNIQUE: Multiplanar, multiecho pulse sequences of the brain and surrounding structures were obtained without intravenous contrast. Angiographic images of the head were obtained using MRA technique without contrast. COMPARISON:  11/20/2015 CT.  04/20/2013 MR. FINDINGS: MRI HEAD FINDINGS No acute infarct. Remote right frontal lobe infarct. Moderate chronic small vessel disease type changes. No intracranial hemorrhage. No intracranial mass lesion noted on this unenhanced exam. Global atrophy without hydrocephalus. Major intracranial vascular structures are patent. Spinal stenosis with cord flattening C3-4 and C4-5 level. Post lens replacement otherwise orbital structures unremarkable. Cervical medullary junction, pituitary region and pineal region unremarkable. MRA HEAD FINDINGS Anterior circulation without medium or large size vessel significant stenosis or occlusion. Mild narrowing A1 segment right anterior cerebral artery. Mild middle cerebral artery branch vessel irregularity bilaterally. Ectatic vertebral arteries. Right vertebral artery is dominant. Mild narrowing distal left vertebral artery. Nonvisualized posterior inferior cerebellar arteries. Ectatic basilar artery with  slight narrowing mid aspect. Narrowed irregular anterior inferior  cerebral arteries more notable on the right. Mild irregularity superior cerebellar arteries. Fetal type contribution to the posterior cerebral arteries. Posterior cerebral artery distal branch vessel narrowing or irregularity greater on the right. IMPRESSION: MRI HEAD No acute infarct. Remote right frontal lobe infarct. Moderate chronic small vessel disease type changes. Global atrophy without hydrocephalus. Spinal stenosis with cord flattening C3-4 and C4-5 level. MRA HEAD Intracranial atherosclerotic type changes greater posterior circulation and involving branch vessels as detailed above. Electronically Signed   By: Lacy Duverney M.D.   On: 11/21/2015 11:42     PERTINENT LAB RESULTS: CBC:  Recent Labs  11/20/15 1945 11/20/15 1959  WBC 4.7  --   HGB 12.3 14.3  HCT 35.5* 42.0  PLT 146*  --    CMET CMP     Component Value Date/Time   NA 133* 11/20/2015 1959   K 4.2 11/20/2015 1959   CL 95* 11/20/2015 1959   CO2 26 11/20/2015 1945   GLUCOSE 114* 11/20/2015 1959   BUN 21* 11/20/2015 1959   CREATININE 0.80 11/20/2015 1959   CALCIUM 9.6 11/20/2015 1945   PROT 6.6 11/20/2015 1945   ALBUMIN 4.1 11/20/2015 1945   AST 31 11/20/2015 1945   ALT 16 11/20/2015 1945   ALKPHOS 56 11/20/2015 1945   BILITOT 0.6 11/20/2015 1945   GFRNONAA 49* 11/20/2015 1945   GFRAA 57* 11/20/2015 1945    GFR Estimated Creatinine Clearance: 33.9 mL/min (by C-G formula based on Cr of 0.8). No results for input(s): LIPASE, AMYLASE in the last 72 hours.  Recent Labs  11/21/15 0114  TROPONINI <0.03   Invalid input(s): POCBNP No results for input(s): DDIMER in the last 72 hours. No results for input(s): HGBA1C in the last 72 hours.  Recent Labs  11/21/15 0114  CHOL 141  HDL 76  LDLCALC 63  TRIG 11  CHOLHDL 1.9   No results for input(s): TSH, T4TOTAL, T3FREE, THYROIDAB in the last 72 hours.  Invalid input(s): FREET3 No results  for input(s): VITAMINB12, FOLATE, FERRITIN, TIBC, IRON, RETICCTPCT in the last 72 hours. Coags:  Recent Labs  11/20/15 1945  INR 1.08   Microbiology: No results found for this or any previous visit (from the past 240 hour(s)).   BRIEF HOSPITAL COURSE:   Principal Problem:   TIA (transient ischemic attack): admitted with dysarthria- symptoms have completely resolved. Nonfocal exam. MRI brain negative for CVA, carotid Doppler negative for significant stenosis, 2-D echocardiogram without any embolic source. Neurology recommending that we switch from Xarelto to Eliquis.  Seen by physical therapy, no further recommendations. Stable for discharge later today.   Rest of her medical problems were stable during this hospitalization  TODAY-DAY OF DISCHARGE:  Subjective:   Peter Daquila today has no headache,no chest abdominal pain,no new weakness tingling or numbness, feels much better wants to go home today.   Objective:   Blood pressure 113/54, pulse 87, temperature 98.3 F (36.8 C), temperature source Oral, resp. rate 20, height 5\' 1"  (1.549 m), weight 51.256 kg (113 lb), SpO2 99 %.  Intake/Output Summary (Last 24 hours) at 11/21/15 1523 Last data filed at 11/21/15 0953  Gross per 24 hour  Intake    480 ml  Output      0 ml  Net    480 ml   Filed Weights   11/21/15 0030  Weight: 51.256 kg (113 lb)    Exam Awake Alert, Oriented *3, No new F.N deficits, Normal affect [place].AT,PERRAL Supple Neck,No JVD, No cervical lymphadenopathy appriciated.  Symmetrical Chest wall movement, Good air movement bilaterally, CTAB RRR,No Gallops,Rubs or new Murmurs, No Parasternal Heave +ve B.Sounds, Abd Soft, Non tender, No organomegaly appriciated, No rebound -guarding or rigidity. No Cyanosis, Clubbing or edema, No new Rash or bruise  DISCHARGE CONDITION: Stable  DISPOSITION: Home  DISCHARGE INSTRUCTIONS:    Activity:  As tolerated   Get Medicines reviewed and adjusted: Please take  all your medications with you for your next visit with your Primary MD  Please request your Primary MD to go over all hospital tests and procedure/radiological results at the follow up, please ask your Primary MD to get all Hospital records sent to his/her office.  If you experience worsening of your admission symptoms, develop shortness of breath, life threatening emergency, suicidal or homicidal thoughts you must seek medical attention immediately by calling 911 or calling your MD immediately  if symptoms less severe.  You must read complete instructions/literature along with all the possible adverse reactions/side effects for all the Medicines you take and that have been prescribed to you. Take any new Medicines after you have completely understood and accpet all the possible adverse reactions/side effects.   Do not drive when taking Pain medications.   Do not take more than prescribed Pain, Sleep and Anxiety Medications  Special Instructions: If you have smoked or chewed Tobacco  in the last 2 yrs please stop smoking, stop any regular Alcohol  and or any Recreational drug use.  Wear Seat belts while driving.  Please note  You were cared for by a hospitalist during your hospital stay. Once you are discharged, your primary care physician will handle any further medical issues. Please note that NO REFILLS for any discharge medications will be authorized once you are discharged, as it is imperative that you return to your primary care physician (or establish a relationship with a primary care physician if you do not have one) for your aftercare needs so that they can reassess your need for medications and monitor your lab values.   Diet recommendation: Heart Healthy diet   Discharge Instructions    Ambulatory referral to Neurology    Complete by:  As directed   Follow up with NP Darrol Angel in one month. Thanks     Call MD for:  persistant dizziness or light-headedness    Complete by:   As directed      Diet - low sodium heart healthy    Complete by:  As directed      Increase activity slowly    Complete by:  As directed            Follow-up Information    Follow up with Nilda Riggs, NP. Schedule an appointment as soon as possible for a visit in 1 month.   Specialty:  Family Medicine   Why:  stroke clinic   Contact information:   [address] [place] [address] [phone]       Follow up with [name], MD. Schedule an appointment as soon as possible for a visit in 1 week.   Specialty:  Family Medicine   Why:  Hospital follow up   Contact information:   [address] [place] [address] [phone]       Total Time spent on discharge equals 25 minutes.  SignedJeoffrey Massed 11/21/2015 3:23 PM

## 2015-11-21 NOTE — Progress Notes (Signed)
STROKE TEAM PROGRESS NOTE   SUBJECTIVE (INTERVAL HISTORY) No family is at the bedside.  Overall she feels her condition is completely resolved. She had similar episode in 2014 and had right MCA infarct and was put on Xarelto. She has no residue deficit. She stated compliance with Xarelto.   OBJECTIVE Temp:  [97.5 F (36.4 C)-98.5 F (36.9 C)] 98.4 F (36.9 C) (01/02 0627) Pulse Rate:  [57-140] 72 (01/02 0627) Cardiac Rhythm:  [-] Atrial fibrillation (01/02 0822) Resp:  [12-19] 18 (01/02 0627) BP: (122-169)/(50-89) 122/50 mmHg (01/02 0627) SpO2:  [97 %-99 %] 98 % (01/02 0627) Weight:  [113 lb (51.256 kg)] 113 lb (51.256 kg) (01/02 0030)  No results for input(s): GLUCAP in the last 168 hours.  Recent Labs Lab 11/20/15 1945 11/20/15 1959  NA 133* 133*  K 4.5 4.2  CL 98* 95*  CO2 26  --   GLUCOSE 120* 114*  BUN 19 21*  CREATININE 0.97 0.80  CALCIUM 9.6  --     Recent Labs Lab 11/20/15 1945  AST 31  ALT 16  ALKPHOS 56  BILITOT 0.6  PROT 6.6  ALBUMIN 4.1    Recent Labs Lab 11/20/15 1945 11/20/15 1959  WBC 4.7  --   NEUTROABS 2.4  --   HGB 12.3 14.3  HCT 35.5* 42.0  MCV 94.2  --   PLT 146*  --     Recent Labs Lab 11/21/15 0114  TROPONINI <0.03    Recent Labs  11/20/15 1945  LABPROT 14.2  INR 1.08   No results for input(s): COLORURINE, LABSPEC, PHURINE, GLUCOSEU, HGBUR, BILIRUBINUR, KETONESUR, PROTEINUR, UROBILINOGEN, NITRITE, LEUKOCYTESUR in the last 72 hours.  Invalid input(s): APPERANCEUR     Component Value Date/Time   CHOL 141 11/21/2015 0114   TRIG 11 11/21/2015 0114   HDL 76 11/21/2015 0114   CHOLHDL 1.9 11/21/2015 0114   VLDL 2 11/21/2015 0114   LDLCALC 63 11/21/2015 0114   Lab Results  Component Value Date   HGBA1C 5.3 04/20/2013      Component Value Date/Time   LABOPIA NONE DETECTED 04/20/2013 0410   COCAINSCRNUR NONE DETECTED 04/20/2013 0410   LABBENZ NONE DETECTED 04/20/2013 0410   AMPHETMU NONE DETECTED 04/20/2013 0410   THCU NONE DETECTED 04/20/2013 0410   LABBARB NONE DETECTED 04/20/2013 0410    No results for input(s): ETH in the last 168 hours.  I have personally reviewed the radiological images below and agree with the radiology interpretations.  Dg Chest 2 View  11/21/2015  IMPRESSION: No active cardiopulmonary disease. Moderate hiatal hernia.   Ct Head Wo Contrast  11/20/2015  IMPRESSION: No acute intracranial process. Remote right frontal lobe infarct. Chronic small vessel ischemic changes and cortical atrophy.  Mri and Mra Brain Wo Contrast  11/21/2015  IMPRESSION: MRI HEAD No acute infarct. Remote right frontal lobe infarct. Moderate chronic small vessel disease type changes. Global atrophy without hydrocephalus. Spinal stenosis with cord flattening C3-4 and C4-5 level. MRA HEAD Intracranial atherosclerotic type changes greater posterior circulation and involving branch vessels as detailed above.   Carotid Doppler  Findings consistent with1-39 % stenosis involving the right internal carotid artery and the left internal carotid artery. Essentially unchanged from the prior study 04/21/2103  2D Echocardiogram  Pending   PHYSICAL EXAM  Temp:  [97.5 F (36.4 C)-98.5 F (36.9 C)] 98.4 F (36.9 C) (01/02 0627) Pulse Rate:  [57-140] 72 (01/02 0627) Resp:  [12-19] 18 (01/02 0627) BP: (122-169)/(50-89) 122/50 mmHg (01/02 0627) SpO2:  [97 %-  99 %] 98 % (01/02 0627) Weight:  [113 lb (51.256 kg)] 113 lb (51.256 kg) (01/02 0030)  General - Well nourished, well developed, in no apparent distress.  Ophthalmologic - fundi not visualized due to eye movement.  Cardiovascular - irregularly irregular heart rate and rhythm  Neck - supple, no carotid bruits  Mental Status -  Level of arousal and orientation to time, place, and person were intact. Language including expression, naming, repetition, comprehension was assessed and found intact. Fund of Knowledge was assessed and was intact.  Cranial Nerves II  - XII - II - Visual field intact OU. III, IV, VI - Extraocular movements intact. V - Facial sensation intact bilaterally. VII - Facial movement intact bilaterally. VIII - Hearing & vestibular intact bilaterally. X - Palate elevates symmetrically. XI - Chin turning & shoulder shrug intact bilaterally. XII - Tongue protrusion intact.  Motor Strength - The patient's strength was normal in all extremities and pronator drift was absent.  Bulk was normal and fasciculations were absent.   Motor Tone - Muscle tone was assessed at the neck and appendages and was normal.  Reflexes - The patient's reflexes were symmetrical in all extremities and she had no pathological reflexes.  Sensory - Light touch, temperature/pinprick were assessed and were symmetrical.    Coordination - The patient had normal movements in the hands and feet with no ataxia or dysmetria.  Tremor was absent.  Gait and Station - not tested due to safety concerns.   ASSESSMENT/PLAN [name] is a 82 y.o. female with history of afib on Xarelto, HTN, hx of stroke in 2014 due to afib admitted for transient aphasia. Symptoms resolved.    TIA:  Left brain TIA likely due to afib even on Xarelto  MRI  No acute infarct  MRA  No large vessel occlusion but atherosclerotic changes  Carotid Doppler  Unremarkable   2D Echo  pending  LDL 63  HgbA1c pending  Xarelto for VTE prophylaxis  Diet Heart Room service appropriate?: Yes; Fluid consistency:: Thin   Xarelto (rivaroxaban) daily prior to admission, now on Xarelto (rivaroxaban) daily and ASA. After discussion with pt, we recommend to switch Xarelto and ASA to Eliquis 2.5mg  bid. Continue eliquis on discharge.  Patient counseled to be compliant with her antithrombotic medications  Ongoing aggressive stroke risk factor management  Therapy recommendations:  Pending   Disposition:  Likely home  Hypertension  Home meds:   Losartan and norvasc and metoprolol BP  goal normotensive Currently on metoprolol  Stable  Patient counseled to be compliant with her blood pressure medications  Other Stroke Risk Factors  Advanced age   Hx stroke/TIA - 2014 right MCA infarct with no residue  Other Active Problems  Mild hyponatremia  Other Pertinent History    Hospital day #   Neurology will sign off. Please call with questions. Pt will follow up with NP Darrol Angel at [facility] in about 1 month. Thanks for the consult.  Marvel Plan, MD PhD Stroke Neurology 11/21/2015 1:09 PM    To contact Stroke Continuity provider, please refer to [email or web address]. After hours, contact General Neurology

## 2015-11-21 NOTE — Progress Notes (Signed)
VASCULAR LAB PRELIMINARY  PRELIMINARY  PRELIMINARY  PRELIMINARY  Carotid duplex Doppler has been completed.    Preliminary report:  Findings consistent with1-39 % stenosis  involving the right internal carotid artery and the left internal carotid artery. Essentially unchanged from the prior study 04/21/2103  Jenetta Logesami Kayana Thoen, RVT, RDMS 11/21/2015, 12:31 PM

## 2015-11-21 NOTE — Progress Notes (Signed)
Patient ready for discharge to home; discharge instructions given and reviewed; Rx given; niece is present to accompany patient home.  Stroke education completed; patient diagnosis is TIA; refused EMMI follow up from [facility]. Discharged out via wheelchair.

## 2015-11-22 LAB — URINE CULTURE

## 2015-11-22 LAB — HEMOGLOBIN A1C
HEMOGLOBIN A1C: 6.3 % — AB (ref 4.8–5.6)
Mean Plasma Glucose: 134 mg/dL

## 2016-02-29 ENCOUNTER — Ambulatory Visit (INDEPENDENT_AMBULATORY_CARE_PROVIDER_SITE_OTHER): Payer: Medicare HMO | Admitting: Neurology

## 2016-02-29 ENCOUNTER — Encounter: Payer: Self-pay | Admitting: Neurology

## 2016-02-29 VITALS — BP 145/73 | HR 74 | Ht 60.0 in | Wt 111.6 lb

## 2016-02-29 DIAGNOSIS — G459 Transient cerebral ischemic attack, unspecified: Secondary | ICD-10-CM

## 2016-02-29 NOTE — Patient Instructions (Signed)
I had a long d/w patient about her recent TIA, risk for recurrent stroke/TIAs, personally independently reviewed imaging studies and stroke evaluation results and answered questions.Continue Eliquis  for secondary stroke prevention and maintain strict control of hypertension with blood pressure goal below 130/90, diabetes with hemoglobin A1c goal below 6.5% and lipids with LDL cholesterol goal below 70 mg/dL. I also advised the patient to eat a healthy diet with plenty of whole grains, cereals, fruits and vegetables, exercise regularly and maintain ideal body weight .Check follow-up lipid profile and liver function tests. Followup in the future with me in 6 months or call earlier if necessary. Stroke Prevention Some medical conditions and behaviors are associated with an increased chance of having a stroke. You may prevent a stroke by making healthy choices and managing medical conditions. HOW CAN I REDUCE MY RISK OF HAVING A STROKE?   Stay physically active. Get at least 30 minutes of activity on most or all days.  Do not smoke. It may also be helpful to avoid exposure to secondhand smoke.  Limit alcohol use. Moderate alcohol use is considered to be:  No more than 2 drinks per day for men.  No more than 1 drink per day for nonpregnant women.  Eat healthy foods. This involves:  Eating 5 or more servings of fruits and vegetables a day.  Making dietary changes that address high blood pressure (hypertension), high cholesterol, diabetes, or obesity.  Manage your cholesterol levels.  Making food choices that are high in fiber and low in saturated fat, trans fat, and cholesterol may control cholesterol levels.  Take any prescribed medicines to control cholesterol as directed by your health care provider.  Manage your diabetes.  Controlling your carbohydrate and sugar intake is recommended to manage diabetes.  Take any prescribed medicines to control diabetes as directed by your health care  provider.  Control your hypertension.  Making food choices that are low in salt (sodium), saturated fat, trans fat, and cholesterol is recommended to manage hypertension.  Ask your health care provider if you need treatment to lower your blood pressure. Take any prescribed medicines to control hypertension as directed by your health care provider.  If you are 1318-80 years of age, have your blood pressure checked every 3-5 years. If you are 80 years of age or older, have your blood pressure checked every year.  Maintain a healthy weight.  Reducing calorie intake and making food choices that are low in sodium, saturated fat, trans fat, and cholesterol are recommended to manage weight.  Stop drug abuse.  Avoid taking birth control pills.  Talk to your health care provider about the risks of taking birth control pills if you are over 25106 years old, smoke, get migraines, or have ever had a blood clot.  Get evaluated for sleep disorders (sleep apnea).  Talk to your health care provider about getting a sleep evaluation if you snore a lot or have excessive sleepiness.  Take medicines only as directed by your health care provider.  For some people, aspirin or blood thinners (anticoagulants) are helpful in reducing the risk of forming abnormal blood clots that can lead to stroke. If you have the irregular heart rhythm of atrial fibrillation, you should be on a blood thinner unless there is a good reason you cannot take them.  Understand all your medicine instructions.  Make sure that other conditions (such as anemia or atherosclerosis) are addressed. SEEK IMMEDIATE MEDICAL CARE IF:   You have sudden weakness or numbness  of the face, arm, or leg, especially on one side of the body.  Your face or eyelid droops to one side.  You have sudden confusion.  You have trouble speaking (aphasia) or understanding.  You have sudden trouble seeing in one or both eyes.  You have sudden trouble  walking.  You have dizziness.  You have a loss of balance or coordination.  You have a sudden, severe headache with no known cause.  You have new chest pain or an irregular heartbeat. Any of these symptoms may represent a serious problem that is an emergency. Do not wait to see if the symptoms will go away. Get medical help at once. Call your local emergency services (911 in [place] Do not drive yourself to the hospital.   This information is not intended to replace advice given to you by your health care provider. Make sure you discuss any questions you have with your health care provider.   Document Released: 12/13/2004 Document Revised: 11/26/2014 Document Reviewed: 05/08/2013 Elsevier Interactive Patient Education [employer].

## 2016-02-29 NOTE — Progress Notes (Addendum)
PATIENT: Theresa Kennedy DOB: 12/06/1920  REASON FOR VISIT: follow up HISTORY FROM: patient  HISTORY OF PRESENT ILLNESS: 05/27/13 (PS): Theresa Kennedy is an 82 y.o. female with a history of hypertension who was found to be confused and having speech difficulty at 2100 on 04/19/2013. It was unknown when the patient was last seen well. There was no documented history of previous stroke. Patient has been taking aspirin 81 mg daily. CT scan of her head showed no acute intracranial abnormality. Patient was beyond time window for treatment consideration with TPA, CT of the brain 04/20/2013 showed no acute intracranial pathology seen on CT.but mild cortical volume loss and minimal small vessel ischemic microangiopathy. MRI of the brain showed small to moderate-sized right frontal lobe infarct. Very small acute non hemorrhagic infarct right occipital lobe. MRA of the brain showed Intracranial atherosclerotic type changes predominately involving branch vessels. 2D Echocardiogram showed EF 60-65% with no source of embolus. Carotid Doppler showed BIlateral: mild mixed plaque origin ICA. <39% ICA stenosis. Vertebral artery flow is antegrade.lipid profile showed total cholesterol 229 and LDL 111 mg percent. Hemoglobin A1c was normal at 5.3. Urine drug screen was negative. EEG showed focal slowing at F8 and T8 but without definite evidence of seizures. She was started on xarelto for her atrial fibrillation and did well with therapies and [facility] [place] and has shown significant improvement and plans to return home to live by herself soon. The patient's son and daughter feel that she has noticed some new cognitive difficulties particularly with numbers which she cannot and poor balance her checkbook. She plans to have a [employer] and a CNA will visit her on a daily basis. She plans to participate in the [employer] program.   UPDATE 03/17/14 (LL): Theresa Kennedy comes to office for stroke  revisit. She is doing very well and has no complaints. She moved back home in July and wears a life alert at all times. She goes to [employer] 3 times a week. Her BP normally runs 120-130's /60-70's but is elevated in office today, at 152/70. She is tolerating Xarelto daily for afib and has no side effects of bleeding or significant bruising.  Update 02/29/2016 : She is seen for follow-up after last visit 2 years ago. She is accompanied by her son. She was [facility] in January 2017 with the TIA. She presented with transient aphasia and symptoms resolved quickly. MRI scan of the brain did not reveal an acute infarct. MRA showed no large vessel stenosis. Transthoracic echo showed normal ejection fraction. Hemoglobin A1c was 6.3. LDL cholesterol was 191 mg percent. She was on Xarelto which was switched to eliquis. Patient is states she's done well since then. She is living at home alone. She is independent and active daily living. She does drive a little locally. She exercises 3 times per week. She was started on Lipitor 10 mg which she seemed to be tolerating well. She also takes fish oil. She has not had follow-up lipid profile checked. She is tolerating eliquis without bleeding bruising or other side effects.  REVIEW OF SYSTEMS: Full 14 system review of systems performed and notable only for:  Confusion, agitation, snoring and all other systems negative  ALLERGIES: Allergies  Allergen Reactions  . Sulfa Antibiotics Other (See Comments)    unknown    HOME MEDICATIONS: Outpatient Prescriptions Prior to Visit  Medication Sig Dispense Refill  . amLODipine (NORVASC) 10 MG  tablet Take 10 mg by mouth every evening.     Marland Kitchen apixaban (ELIQUIS) 2.5 MG TABS tablet Take 1 tablet (2.5 mg total) by mouth 2 (two) times daily. 120 tablet 0  . Calcium Carbonate-Vitamin D 600-200 MG-UNIT TABS Take 1 tablet by mouth daily.    . carbamide peroxide (DEBROX) 6.5 % otic solution  Place 5 drops into both ears 2 (two) times daily. (Patient taking differently: Place 5 drops into both ears once a week. USE ONCE A WEEK) 15 mL 0  . dexlansoprazole (DEXILANT) 60 MG capsule Take 60 mg by mouth daily.    Marland Kitchen docusate sodium (COLACE) 100 MG capsule Take 1 capsule (100 mg total) by mouth daily. 30 capsule 1  . Grape Seed Extract 100 MG CAPS Take 100 mg by mouth daily.     Marland Kitchen losartan (COZAAR) 25 MG tablet Take 50 mg by mouth every evening.     . metoprolol succinate (TOPROL-XL) 25 MG 24 hr tablet Take 25 mg by mouth 2 (two) times daily.    . Multiple Vitamin (MULTIVITAMIN WITH MINERALS) TABS tablet Take 1 tablet by mouth daily.    . polyethylene glycol (MIRALAX / GLYCOLAX) packet Take 17 g by mouth daily as needed for mild constipation. 30 packet 1  . psyllium (METAMUCIL) 58.6 % packet Take 1 packet by mouth daily.     No facility-administered medications prior to visit.     PHYSICAL EXAM  Filed Vitals:   02/29/16 1309  BP: 145/73  Pulse: 74  Height: 5' (1.524 m)  Weight: 111 lb 9.6 oz (50.621 kg)   Body mass index is 21.8 kg/(m^2).  General: frail petite elderly caucasian lady, seated, in no evident distress  Head: head normocephalic and atraumatic.      Neck: supple with no carotid or supraclavicular bruits  Cardiovascular: regular rate and rhythm, no murmurs  Musculoskeletal: Moderate kyphoscoliosis  Skin: no rash/petichiae  Vascular: Normal pulses all extremities   Neurologic Exam  Mental Status: Awake and fully alert. Speech and language appear normal. Impaired attention and recall. Follows three-step commands. No aphasia, dysarthria or apraxia. Cranial Nerves: Pupils equal, briskly reactive to light. Extraocular movements full without nystagmus. Visual fields full to confrontation. Hearing intact. Facial sensation intact. Face, tongue, palate moves normally and symmetrically.  Motor: Normal bulk and tone. Normal strength in all tested extremity muscles. Mild fine  action tremor of outstretched bilateral upper extremities. No cogwheel rigidity  Sensory: intact to tough and pinprick and vibratory.  Coordination: Rapid alternating movements normal in all extremities. Finger-to-nose and heel-to-shin performed accurately bilaterally.  Gait and Station: Arises from chair without difficulty. Stance is normal. Gait demonstrates normal stride length and balance . Able to heel, toe walk with mild difficulty. Tandem unsteady. Romberg negative.  Reflexes: 1+ and symmetric.    ASSESSMENT AND PLAN CAROTID DOPPLERS 04/20/13 : BIlateral: mild mixed plaque origin ICA. <39% ICA stenosis. Vertebral artery flow is antegrade.  ASSESSMENT AND PLAN  82 year old lady with transient episode of expressive aphasia in June 2014 secondary to cardioembolic right frontal and left occipital infarcts from atrial fibrillation. Mild age appropriate cognitive impairment. Vascular risk factors of hypertension, hyperlipidimia, atrial fibrillation and age. Recent TIA in January 2017 when Xarelto was switched to Eliquis    Plan : I had a long d/w patient about her recent TIA, risk for recurrent stroke/TIAs, personally independently reviewed imaging studies and stroke evaluation results and answered questions.Continue Eliquis  for secondary stroke prevention and maintain strict control of hypertension  with blood pressure goal below 130/90, diabetes with hemoglobin A1c goal below 6.5% and lipids with LDL cholesterol goal below 70 mg/dL. I also advised the patient to eat a healthy diet with plenty of whole grains, cereals, fruits and vegetables, exercise regularly and maintain ideal body weight .Check follow-up lipid profile and liver function tests. Followup in the future with me in 6 months or call earlier if necessary.   02/29/2016, 4:11 PM [facility] [address] [place], [place] [address] [phone]  Note: This document was prepared with digital dictation and  possible smart phrase technology. Any transcriptional errors that result from this process are unintentional.

## 2016-03-06 ENCOUNTER — Other Ambulatory Visit: Payer: Medicare HMO

## 2016-03-06 ENCOUNTER — Other Ambulatory Visit: Payer: Self-pay | Admitting: Neurology

## 2016-03-07 LAB — HEPATIC FUNCTION PANEL
ALBUMIN: 4.1 g/dL (ref 3.2–4.6)
ALK PHOS: 50 IU/L (ref 39–117)
ALT: 14 IU/L (ref 0–32)
AST: 22 IU/L (ref 0–40)
BILIRUBIN TOTAL: 0.4 mg/dL (ref 0.0–1.2)
BILIRUBIN, DIRECT: 0.13 mg/dL (ref 0.00–0.40)
TOTAL PROTEIN: 6.1 g/dL (ref 6.0–8.5)

## 2016-03-07 LAB — LIPID PANEL
CHOL/HDL RATIO: 1.7 ratio (ref 0.0–4.4)
Cholesterol, Total: 144 mg/dL (ref 100–199)
HDL: 83 mg/dL (ref >39)
LDL Calculated: 54 mg/dL (ref 0–99)
Triglycerides: 37 mg/dL (ref 0–149)
VLDL CHOLESTEROL CAL: 7 mg/dL (ref 5–40)

## 2016-04-01 IMAGING — CR DG CHEST 2V
2 series · 2 of 2 positions shown · non-contrast
Comparison: 02/12/2015 CT scout film.

CLINICAL DATA: [AGE] female with acute shortness of breath
and weakness.

EXAM:
CHEST  2 VIEW

[chest pa]
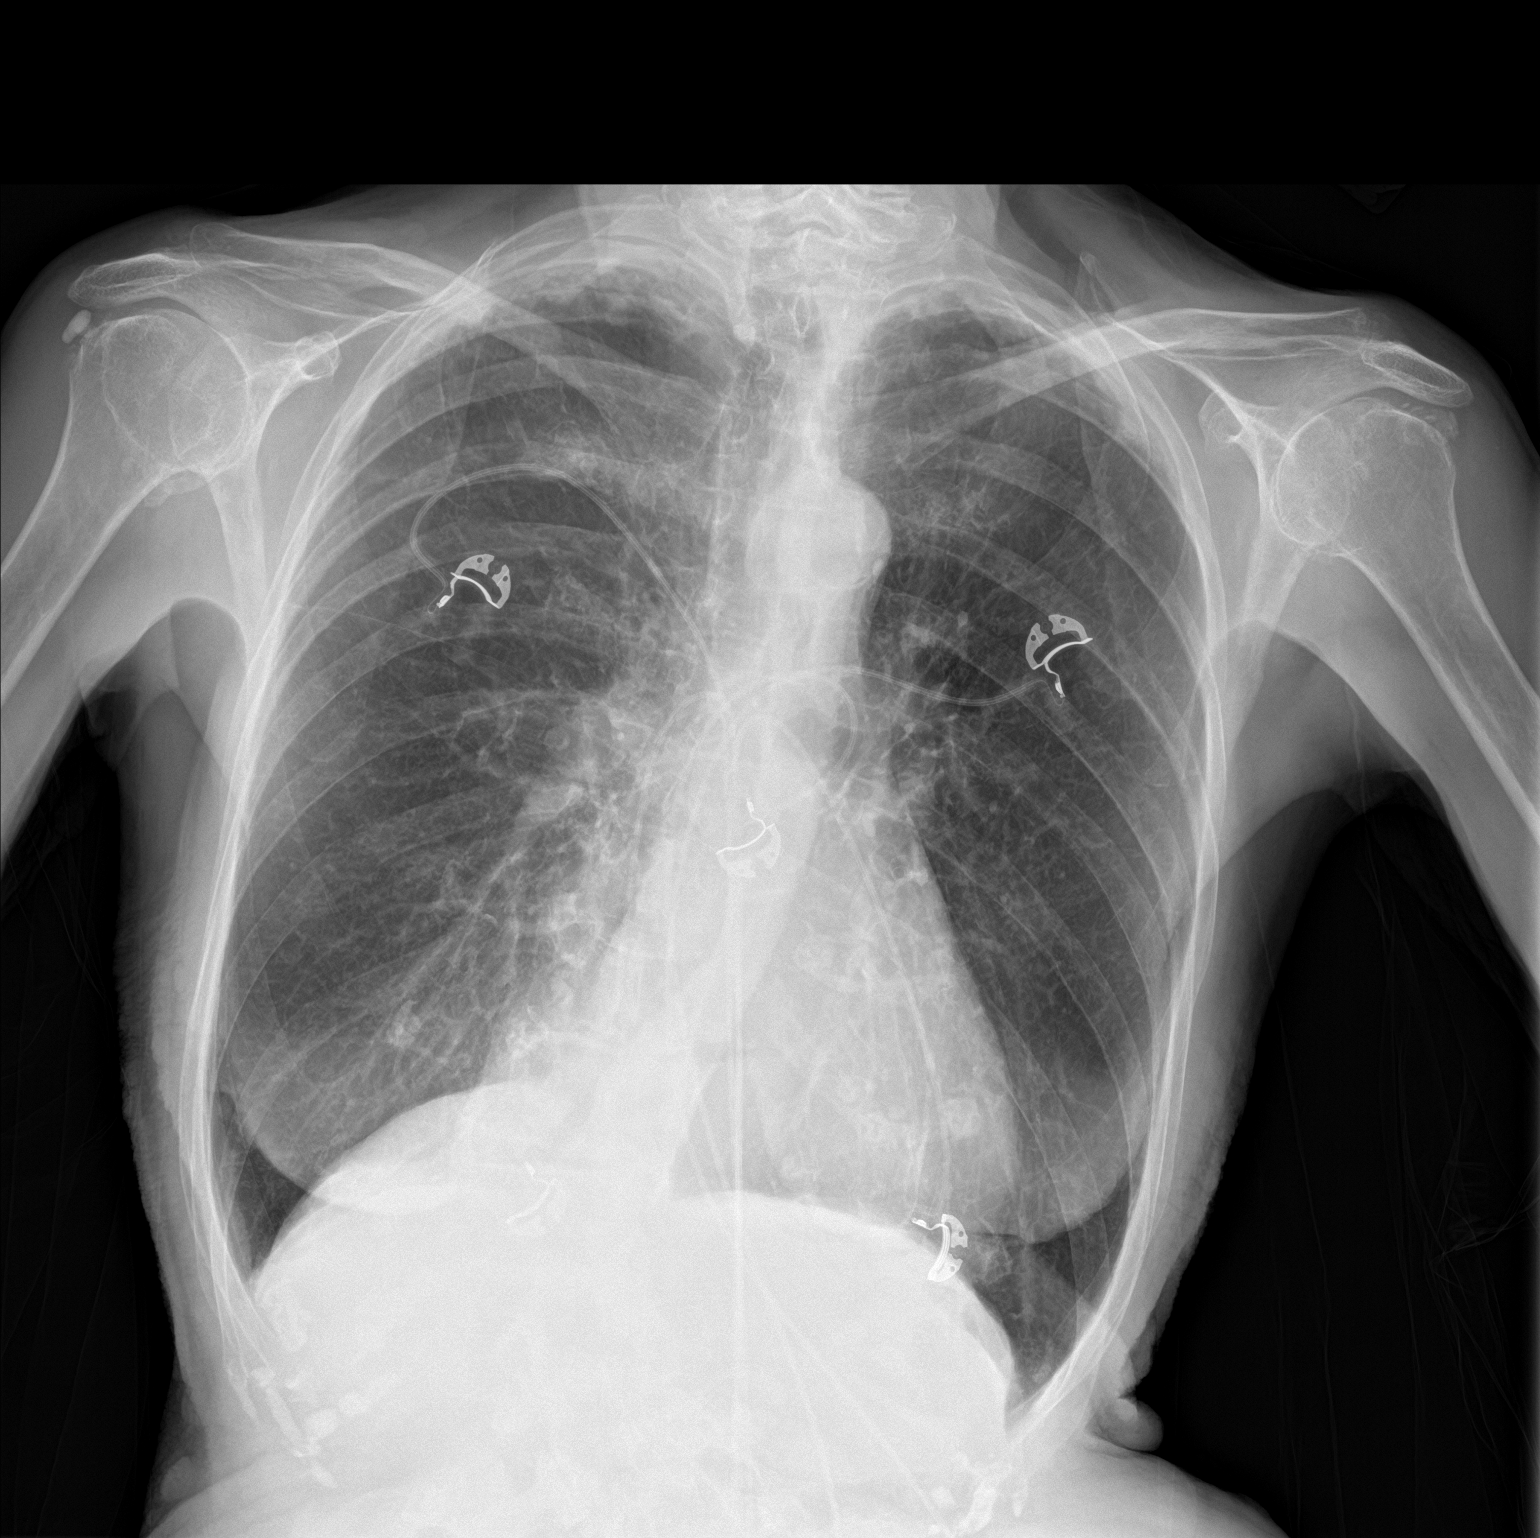

[chest lat]
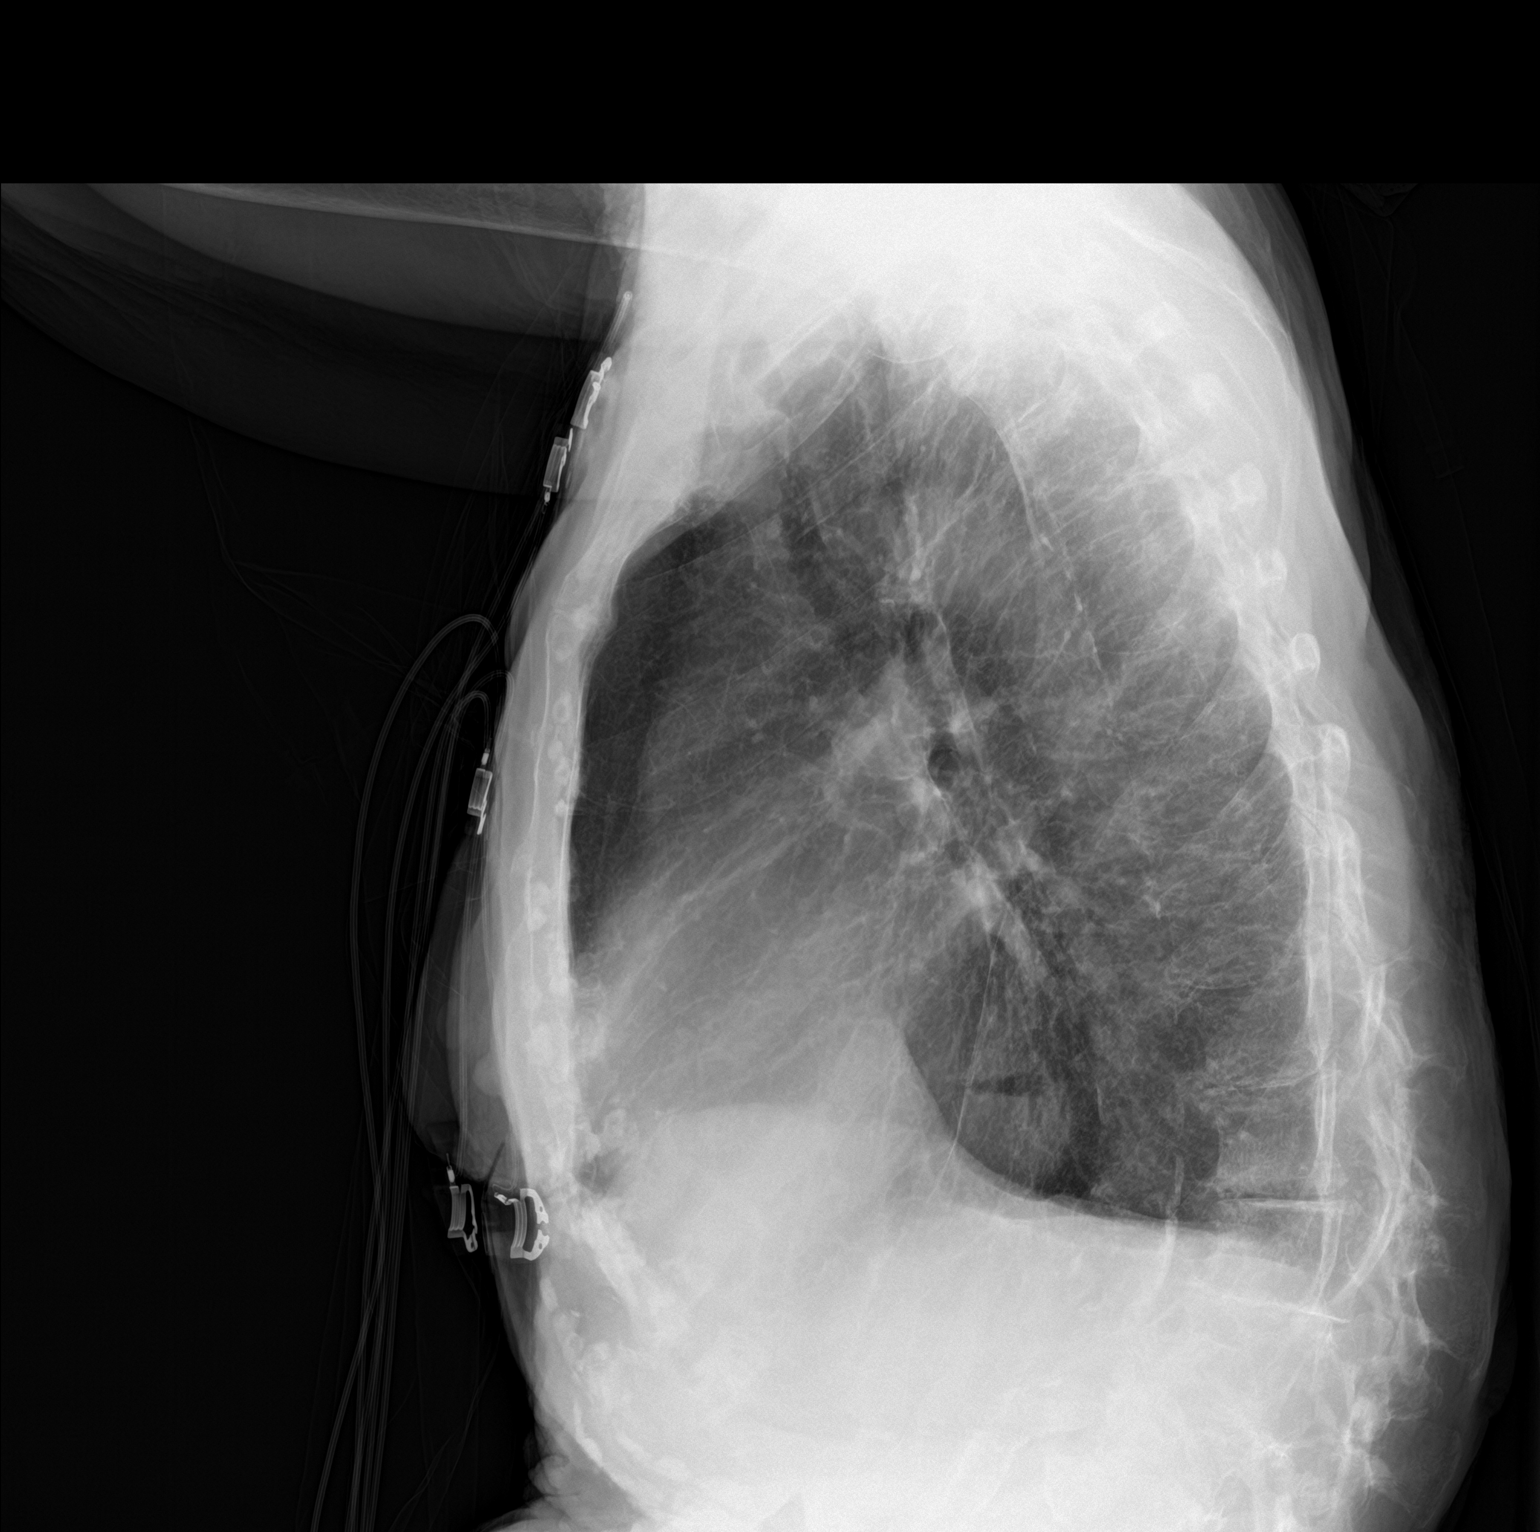

[2 of 2 positions shown; findings below may reference images not displayed]

FINDINGS: The cardiomediastinal silhouette is unremarkable.

A moderate hiatal hernia is again noted.

There is no evidence of focal airspace disease, pulmonary edema,
suspicious pulmonary nodule/mass, pleural effusion, or pneumothorax.
No acute bony abnormalities are identified.
IMPRESSION: No active cardiopulmonary disease.

Moderate hiatal hernia.

## 2016-07-12 ENCOUNTER — Encounter: Payer: Self-pay | Admitting: Cardiology

## 2016-07-23 NOTE — Progress Notes (Signed)
HPI The patient presents for followup of atrial fib.  Since I last saw her she was hospitalized in Jan for a TIA.   MRI brain negative for CVA, carotid Doppler negative for significant stenosis, 2-D echocardiogram without any embolic source.  I reviewed these records.   She was taken off of Eliquis and started on Xarelto.  Since that time she has had no further symptoms.  The patient denies any new symptoms such as chest discomfort, neck or arm discomfort. There has been no new shortness of breath, PND or orthopnea. There have been no reported palpitations, presyncope or syncope.  She is going to exercise 3 times per week.     Allergies  Allergen Reactions  . Sulfa Antibiotics Other (See Comments)    unknown    Current Outpatient Prescriptions  Medication Sig Dispense Refill  . amLODipine (NORVASC) 10 MG tablet Take 10 mg by mouth every evening.     Marland Kitchen apixaban (ELIQUIS) 2.5 MG TABS tablet Take 1 tablet (2.5 mg total) by mouth 2 (two) times daily. 120 tablet 0  . Calcium Carbonate-Vitamin D 600-200 MG-UNIT TABS Take 1 tablet by mouth daily.    . carbamide peroxide (DEBROX) 6.5 % otic solution Place 5 drops into both ears 2 (two) times daily. (Patient taking differently: Place 5 drops into both ears once a week. USE ONCE A WEEK) 15 mL 0  . dexlansoprazole (DEXILANT) 60 MG capsule Take 60 mg by mouth daily.    Marland Kitchen docusate sodium (COLACE) 100 MG capsule Take 1 capsule (100 mg total) by mouth daily. 30 capsule 1  . Grape Seed Extract 100 MG CAPS Take 100 mg by mouth daily.     Marland Kitchen losartan (COZAAR) 25 MG tablet Take 50 mg by mouth every evening.     . metoprolol succinate (TOPROL-XL) 25 MG 24 hr tablet Take 25 mg by mouth 2 (two) times daily.    . Multiple Vitamin (MULTIVITAMIN WITH MINERALS) TABS tablet Take 1 tablet by mouth daily.    . Omega-3 Fatty Acids (FISH OIL OMEGA-3 PO) Take by mouth.    . polyethylene glycol (MIRALAX / GLYCOLAX) packet Take 17 g by mouth daily as needed for mild  constipation. 30 packet 1  . psyllium (METAMUCIL) 58.6 % packet Take 1 packet by mouth daily.    . ranitidine (ZANTAC) 150 MG capsule Take by mouth 2 (two) times daily.     Marland Kitchen atorvastatin (LIPITOR) 10 MG tablet Take 10 mg by mouth daily at 6 PM.      No current facility-administered medications for this visit.     Past Medical History:  Diagnosis Date  . Atrial fibrillation (HCC)   . GERD (gastroesophageal reflux disease)   . Hypertension   . Stroke [facility])     Past Surgical History:  Procedure Laterality Date  . ABDOMINAL HYSTERECTOMY      ROS:  As stated in the HPI and negative for all other systems.  PHYSICAL EXAM BP (!) 152/63   Pulse (!) 49   Ht 5' (1.524 m)   Wt 110 lb (49.9 kg)   BMI 21.48 kg/m  GENERAL:  Well appearing for her age NECK:  No jugular venous distention, waveform within normal limits, carotid upstroke brisk and symmetric, no bruits, no thyromegaly LUNGS:  Clear to auscultation bilaterally BACK:  No CVA tenderness, mild lordosis.  CHEST:  Unremarkable HEART:  PMI not displaced or sustained,S1 and S2 within normal limits, no S3,  no clicks, no rubs, no  murmurs, irregular ABD:  Flat, positive bowel sounds normal in frequency in pitch, no bruits, no rebound, no guarding, no midline pulsatile mass, no hepatomegaly, no splenomegaly EXT:  2 plus pulses throughout, no edema, no cyanosis no clubbing  EKG:  Atrial fibrillation, rate 60, left axis deviation, left ventricular hypertrophy, no acute ST-T wave changes. 07/25/2016  ASSESSMENT AND PLAN  ATRIAL FIBRILLATION:  She does not notice that she is atrial fib.  She will continue the meds as listed.  Ms. Carleene CooperBessie E Prestigiacomo has a CHA2DS2 - VASc score of 6.  HTN:  Her blood pressure is mildly elevated but this is unusual. No change in therapy is indicated.  HOSPITAL RECORDS REVIEWED.

## 2016-07-25 ENCOUNTER — Ambulatory Visit (INDEPENDENT_AMBULATORY_CARE_PROVIDER_SITE_OTHER): Payer: Medicare HMO | Admitting: Cardiology

## 2016-07-25 ENCOUNTER — Encounter: Payer: Self-pay | Admitting: Cardiology

## 2016-07-25 VITALS — BP 152/63 | HR 49 | Ht 60.0 in | Wt 110.0 lb

## 2016-07-25 DIAGNOSIS — I481 Persistent atrial fibrillation: Secondary | ICD-10-CM

## 2016-07-25 DIAGNOSIS — G459 Transient cerebral ischemic attack, unspecified: Secondary | ICD-10-CM | POA: Diagnosis not present

## 2016-07-25 DIAGNOSIS — I4819 Other persistent atrial fibrillation: Secondary | ICD-10-CM

## 2016-07-25 NOTE — Patient Instructions (Signed)

## 2016-09-11 ENCOUNTER — Ambulatory Visit (INDEPENDENT_AMBULATORY_CARE_PROVIDER_SITE_OTHER): Payer: Medicare HMO | Admitting: Neurology

## 2016-09-11 ENCOUNTER — Encounter: Payer: Self-pay | Admitting: Neurology

## 2016-09-11 VITALS — BP 156/69 | HR 64 | Ht 61.0 in | Wt 110.6 lb

## 2016-09-11 DIAGNOSIS — I6990 Unspecified sequelae of unspecified cerebrovascular disease: Secondary | ICD-10-CM

## 2016-09-11 NOTE — Progress Notes (Signed)
PATIENT: Theresa Kennedy DOB: 1921/05/19  REASON FOR VISIT: follow up HISTORY FROM: patient  HISTORY OF PRESENT ILLNESS: 05/27/13 (PS): Theresa Kennedy is an 82 y.o. female with a history of hypertension who was found to be confused and having speech difficulty at 2100 on 04/19/2013. It was unknown when the patient was last seen well. There was no documented history of previous stroke. Patient has been taking aspirin 81 mg daily. CT scan of her head showed no acute intracranial abnormality. Patient was beyond time window for treatment consideration with TPA, CT of the brain 04/20/2013 showed no acute intracranial pathology seen on CT.but mild cortical volume loss and minimal small vessel ischemic microangiopathy. MRI of the brain showed small to moderate-sized right frontal lobe infarct. Very small acute non hemorrhagic infarct right occipital lobe. MRA of the brain showed Intracranial atherosclerotic type changes predominately involving branch vessels. 2D Echocardiogram showed EF 60-65% with no source of embolus. Carotid Doppler showed BIlateral: mild mixed plaque origin ICA. <39% ICA stenosis. Vertebral artery flow is antegrade.lipid profile showed total cholesterol 229 and LDL 111 mg percent. Hemoglobin A1c was normal at 5.3. Urine drug screen was negative. EEG showed focal slowing at F8 and T8 but without definite evidence of seizures. She was started on xarelto for her atrial fibrillation and did well with therapies and is currently living at the [facility] in [place] and has shown significant improvement and plans to return home to live by herself soon. The patient's son and daughter feel that she has noticed some new cognitive difficulties particularly with numbers which she cannot and poor balance her checkbook. She plans to have a [employer] and a CNA will visit her on a daily basis. She plans to participate in the [employer] program.   UPDATE 03/17/14 (LL): Theresa Kennedy comes to office for stroke  revisit. She is doing very well and has no complaints. She moved back home in July and wears a life alert at all times. She goes to [employer] 3 times a week. Her BP normally runs 120-130's /60-70's but is elevated in office today, at 152/70. She is tolerating Xarelto daily for afib and has no side effects of bleeding or significant bruising.  Update 02/29/2016 : She is seen for follow-up after last visit 2 years ago. She is accompanied by her son. She was recently hospitalized at [facility] in January 2017 with the TIA. She presented with transient aphasia and symptoms resolved quickly. MRI scan of the brain did not reveal an acute infarct. MRA showed no large vessel stenosis. Transthoracic echo showed normal ejection fraction. Hemoglobin A1c was 6.3. LDL cholesterol was 191 mg percent. She was on Xarelto which was switched to eliquis. Patient is states she's done well since then. She is living at home alone. She is independent and active daily living. She does drive a little locally. She exercises 3 times per week. She was started on Lipitor 10 mg which she seemed to be tolerating well. She also takes fish oil. She has not had follow-up lipid profile checked. She is tolerating eliquis without bleeding, bruising or other side effects. Update 09/11/2016 : She returns for follow-up after last visit 6 months ago. She is accompanied by her nephew. Patient can just do well without recurrent stroke and TIAs. She is tolerating eliquis well with only minor bruising but no bleeding episodes. She unfortunately Polakoff muscle and has a bruise at that site. She states her blood pressure usually runs quite good  though it is elevated today at 156/69 in office and she blames it on white coat hypertension. She remains on Lipitor 10 mg she is tolerating well without side effects. She had lipid profile checked by me after last visit and LDL cholesterol was satisfactory and liver function panel was normal. She  continues to live alone and is independent in activities of daily living. She wears a life alert button. She has no new complaints today. REVIEW OF SYSTEMS: Full 14 system review of systems performed and notable only for:    Runny nose, easy bruising, bleeding, headache, speech difficulty, frequent waking, snoring and itching and all the systems negative and all other systems negative  ALLERGIES: Allergies  Allergen Reactions  . Sulfa Antibiotics Other (See Comments)    unknown    HOME MEDICATIONS: Outpatient Medications Prior to Visit  Medication Sig Dispense Refill  . amLODipine (NORVASC) 10 MG tablet Take 10 mg by mouth every evening.     Marland Kitchen. apixaban (ELIQUIS) 2.5 MG TABS tablet Take 1 tablet (2.5 mg total) by mouth 2 (two) times daily. 120 tablet 0  . atorvastatin (LIPITOR) 10 MG tablet Take 10 mg by mouth daily at 6 PM.     . Calcium Carbonate-Vitamin D 600-200 MG-UNIT TABS Take 1 tablet by mouth daily.    . carbamide peroxide (DEBROX) 6.5 % otic solution Place 5 drops into both ears 2 (two) times daily. (Patient taking differently: Place 5 drops into both ears once a week. USE ONCE A WEEK) 15 mL 0  . docusate sodium (COLACE) 100 MG capsule Take 1 capsule (100 mg total) by mouth daily. 30 capsule 1  . Grape Seed Extract 100 MG CAPS Take 100 mg by mouth daily.     Marland Kitchen. losartan (COZAAR) 25 MG tablet Take 50 mg by mouth every evening.     . metoprolol succinate (TOPROL-XL) 25 MG 24 hr tablet Take 25 mg by mouth 2 (two) times daily.    . Multiple Vitamin (MULTIVITAMIN WITH MINERALS) TABS tablet Take 1 tablet by mouth daily.    . Omega-3 Fatty Acids (FISH OIL OMEGA-3 PO) Take by mouth.    . psyllium (METAMUCIL) 58.6 % packet Take 1 packet by mouth daily.    . ranitidine (ZANTAC) 150 MG capsule Take by mouth 2 (two) times daily.     Marland Kitchen. dexlansoprazole (DEXILANT) 60 MG capsule Take 60 mg by mouth daily.    . polyethylene glycol (MIRALAX / GLYCOLAX) packet Take 17 g by mouth daily as needed for  mild constipation. (Patient not taking: Reported on 09/11/2016) 30 packet 1   No facility-administered medications prior to visit.      PHYSICAL EXAM  Vitals:   09/11/16 1637  BP: (!) 156/69  BP Location: Right Arm  Patient Position: Sitting  Cuff Size: Small  Pulse: 64  Weight: 110 lb 9.6 oz (50.2 kg)  Height: 5\' 1"  (1.549 m)   Body mass index is 20.9 kg/m.  General: frail petite elderly caucasian lady, seated, in no evident distress  Head: head normocephalic and atraumatic.      Neck: supple with no carotid or supraclavicular bruits  Cardiovascular: regular rate and rhythm, no murmurs  Musculoskeletal: Moderate kyphoscoliosis  Skin: no rash/petichiae .Patient is wearing a life alert button Vascular: Normal pulses all extremities   Neurologic Exam  Mental Status: Awake and fully alert. Speech and language appear normal. Impaired attention and recall. Follows three-step commands. No aphasia, dysarthria or apraxia.  Cranial Nerves: Pupils equal, briskly  reactive to light. Extraocular movements full without nystagmus. Visual fields full to confrontation. Hearing intact. Facial sensation intact. Face, tongue, palate moves normally and symmetrically.  Motor: Normal bulk and tone. Normal strength in all tested extremity muscles. Mild fine action tremor of outstretched bilateral upper extremities. No cogwheel rigidity  Sensory: intact to tough and pinprick and vibratory.  Coordination: Rapid alternating movements normal in all extremities. Finger-to-nose and heel-to-shin performed accurately bilaterally.  Gait and Station: Arises from chair without difficulty. Stance is stroke. Walks with festinating gait Unable to heel, toe walk with mild difficulty. Tandem unsteady. Romberg negative.  Reflexes: 1+ and symmetric.    ASSESSMENT AND PLAN CAROTID DOPPLERS 04/20/13 : BIlateral: mild mixed plaque origin ICA. <39% ICA stenosis. Vertebral artery flow is antegrade.  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 Mild age appropriate cognitive impairment. Vascular risk factors of hypertension, hyperlipidimia, atrial fibrillation and age. Recent TIA in January 2017 when Xarelto was switched to Eliquis    Plan :I had a long d/w patient and her nephew  about her remote stroke, risk for recurrent stroke/TIAs, personally independently reviewed imaging studies and stroke evaluation results and answered questions.Continue Eliquis (apixaban) daily  for secondary stroke prevention and maintain strict control of hypertension with blood pressure goal below 130/90, diabetes with hemoglobin A1c goal below 6.5% and lipids with LDL cholesterol goal below 70 mg/dL. I also advised the patient to eat a healthy diet with plenty of whole grains, cereals, fruits and vegetables, exercise regularly and maintain ideal body weight . Greater than 50% time during this 25 minute visit was spent on counseling and coordination of care about stroke risk, prevention and treatment No routine scheduled follow-up appointment with me is necessary. She may be referred back by primary physician in the future as necessary. Delia Heady, MD  09/11/2016, 5:11 PM [facility] [address] [place], [place] [address] [phone]  Note: This document was prepared with digital dictation and possible smart phrase technology. Any transcriptional errors that result from this process are unintentional.

## 2016-09-11 NOTE — Patient Instructions (Signed)
I had a long d/w patient and her nephew  about her remote stroke, risk for recurrent stroke/TIAs, personally independently reviewed imaging studies and stroke evaluation results and answered questions.Continue Eliquis (apixaban) daily  for secondary stroke prevention and maintain strict control of hypertension with blood pressure goal below 130/90, diabetes with hemoglobin A1c goal below 6.5% and lipids with LDL cholesterol goal below 70 mg/dL. I also advised the patient to eat a healthy diet with plenty of whole grains, cereals, fruits and vegetables, exercise regularly and maintain ideal body weight . No routine scheduled follow-up appointment with me is necessary. She may be referred back by primary physician in the future as necessary.

## 2017-07-03 ENCOUNTER — Encounter (HOSPITAL_COMMUNITY): Payer: Self-pay | Admitting: Emergency Medicine

## 2017-07-03 ENCOUNTER — Inpatient Hospital Stay (HOSPITAL_COMMUNITY): Payer: Medicare HMO

## 2017-07-03 ENCOUNTER — Inpatient Hospital Stay (HOSPITAL_COMMUNITY)
Admission: EM | Admit: 2017-07-03 | Discharge: 2017-07-16 | DRG: 469 | Disposition: A | Discharge status: Skilled Nursing Facility | Payer: Medicare HMO | Attending: Internal Medicine | Admitting: Internal Medicine

## 2017-07-03 ENCOUNTER — Emergency Department (HOSPITAL_COMMUNITY): Payer: Medicare HMO

## 2017-07-03 DIAGNOSIS — K219 Gastro-esophageal reflux disease without esophagitis: Secondary | ICD-10-CM | POA: Diagnosis present

## 2017-07-03 DIAGNOSIS — K929 Disease of digestive system, unspecified: Secondary | ICD-10-CM | POA: Diagnosis not present

## 2017-07-03 DIAGNOSIS — Y929 Unspecified place or not applicable: Secondary | ICD-10-CM

## 2017-07-03 DIAGNOSIS — Y839 Surgical procedure, unspecified as the cause of abnormal reaction of the patient, or of later complication, without mention of misadventure at the time of the procedure: Secondary | ICD-10-CM | POA: Diagnosis present

## 2017-07-03 DIAGNOSIS — N390 Urinary tract infection, site not specified: Secondary | ICD-10-CM | POA: Diagnosis present

## 2017-07-03 DIAGNOSIS — S728X1A Other fracture of right femur, initial encounter for closed fracture: Secondary | ICD-10-CM | POA: Diagnosis present

## 2017-07-03 DIAGNOSIS — I2510 Atherosclerotic heart disease of native coronary artery without angina pectoris: Secondary | ICD-10-CM | POA: Diagnosis present

## 2017-07-03 DIAGNOSIS — T501X5A Adverse effect of loop [high-ceiling] diuretics, initial encounter: Secondary | ICD-10-CM | POA: Diagnosis not present

## 2017-07-03 DIAGNOSIS — S72009A Fracture of unspecified part of neck of unspecified femur, initial encounter for closed fracture: Secondary | ICD-10-CM

## 2017-07-03 DIAGNOSIS — J90 Pleural effusion, not elsewhere classified: Secondary | ICD-10-CM | POA: Diagnosis present

## 2017-07-03 DIAGNOSIS — I10 Essential (primary) hypertension: Secondary | ICD-10-CM | POA: Diagnosis present

## 2017-07-03 DIAGNOSIS — E785 Hyperlipidemia, unspecified: Secondary | ICD-10-CM | POA: Diagnosis present

## 2017-07-03 DIAGNOSIS — D696 Thrombocytopenia, unspecified: Secondary | ICD-10-CM | POA: Diagnosis not present

## 2017-07-03 DIAGNOSIS — K9189 Other postprocedural complications and disorders of digestive system: Secondary | ICD-10-CM | POA: Diagnosis not present

## 2017-07-03 DIAGNOSIS — K560 Paralytic ileus: Secondary | ICD-10-CM | POA: Diagnosis not present

## 2017-07-03 DIAGNOSIS — G9341 Metabolic encephalopathy: Secondary | ICD-10-CM

## 2017-07-03 DIAGNOSIS — Z882 Allergy status to sulfonamides status: Secondary | ICD-10-CM

## 2017-07-03 DIAGNOSIS — D6959 Other secondary thrombocytopenia: Secondary | ICD-10-CM | POA: Diagnosis not present

## 2017-07-03 DIAGNOSIS — I481 Persistent atrial fibrillation: Secondary | ICD-10-CM | POA: Diagnosis present

## 2017-07-03 DIAGNOSIS — K567 Ileus, unspecified: Secondary | ICD-10-CM | POA: Diagnosis not present

## 2017-07-03 DIAGNOSIS — B9620 Unspecified Escherichia coli [E. coli] as the cause of diseases classified elsewhere: Secondary | ICD-10-CM | POA: Diagnosis present

## 2017-07-03 DIAGNOSIS — I482 Chronic atrial fibrillation: Secondary | ICD-10-CM | POA: Diagnosis not present

## 2017-07-03 DIAGNOSIS — D72829 Elevated white blood cell count, unspecified: Secondary | ICD-10-CM | POA: Diagnosis not present

## 2017-07-03 DIAGNOSIS — I4892 Unspecified atrial flutter: Secondary | ICD-10-CM | POA: Diagnosis present

## 2017-07-03 DIAGNOSIS — Z823 Family history of stroke: Secondary | ICD-10-CM

## 2017-07-03 DIAGNOSIS — J9811 Atelectasis: Secondary | ICD-10-CM | POA: Diagnosis not present

## 2017-07-03 DIAGNOSIS — I4891 Unspecified atrial fibrillation: Secondary | ICD-10-CM | POA: Diagnosis not present

## 2017-07-03 DIAGNOSIS — Z888 Allergy status to other drugs, medicaments and biological substances status: Secondary | ICD-10-CM

## 2017-07-03 DIAGNOSIS — Z8249 Family history of ischemic heart disease and other diseases of the circulatory system: Secondary | ICD-10-CM

## 2017-07-03 DIAGNOSIS — Z978 Presence of other specified devices: Secondary | ICD-10-CM

## 2017-07-03 DIAGNOSIS — S72009D Fracture of unspecified part of neck of unspecified femur, subsequent encounter for closed fracture with routine healing: Secondary | ICD-10-CM | POA: Diagnosis not present

## 2017-07-03 DIAGNOSIS — I700 Atherosclerosis of aorta: Secondary | ICD-10-CM | POA: Diagnosis present

## 2017-07-03 DIAGNOSIS — S72011A Unspecified intracapsular fracture of right femur, initial encounter for closed fracture: Secondary | ICD-10-CM | POA: Diagnosis present

## 2017-07-03 DIAGNOSIS — L7632 Postprocedural hematoma of skin and subcutaneous tissue following other procedure: Secondary | ICD-10-CM | POA: Diagnosis present

## 2017-07-03 DIAGNOSIS — Z8673 Personal history of transient ischemic attack (TIA), and cerebral infarction without residual deficits: Secondary | ICD-10-CM | POA: Diagnosis not present

## 2017-07-03 DIAGNOSIS — W010XXA Fall on same level from slipping, tripping and stumbling without subsequent striking against object, initial encounter: Secondary | ICD-10-CM | POA: Diagnosis present

## 2017-07-03 DIAGNOSIS — N179 Acute kidney failure, unspecified: Secondary | ICD-10-CM | POA: Diagnosis present

## 2017-07-03 DIAGNOSIS — Z803 Family history of malignant neoplasm of breast: Secondary | ICD-10-CM

## 2017-07-03 DIAGNOSIS — K224 Dyskinesia of esophagus: Secondary | ICD-10-CM | POA: Diagnosis present

## 2017-07-03 DIAGNOSIS — E871 Hypo-osmolality and hyponatremia: Secondary | ICD-10-CM | POA: Diagnosis present

## 2017-07-03 DIAGNOSIS — S72001D Fracture of unspecified part of neck of right femur, subsequent encounter for closed fracture with routine healing: Secondary | ICD-10-CM | POA: Diagnosis not present

## 2017-07-03 DIAGNOSIS — R143 Flatulence: Secondary | ICD-10-CM | POA: Diagnosis not present

## 2017-07-03 DIAGNOSIS — J9601 Acute respiratory failure with hypoxia: Secondary | ICD-10-CM | POA: Diagnosis not present

## 2017-07-03 DIAGNOSIS — E869 Volume depletion, unspecified: Secondary | ICD-10-CM | POA: Diagnosis not present

## 2017-07-03 DIAGNOSIS — Z806 Family history of leukemia: Secondary | ICD-10-CM

## 2017-07-03 DIAGNOSIS — R142 Eructation: Secondary | ICD-10-CM | POA: Diagnosis not present

## 2017-07-03 DIAGNOSIS — Z66 Do not resuscitate: Secondary | ICD-10-CM | POA: Diagnosis present

## 2017-07-03 DIAGNOSIS — R140 Abdominal distension (gaseous): Secondary | ICD-10-CM | POA: Diagnosis not present

## 2017-07-03 DIAGNOSIS — Y9341 Activity, dancing: Secondary | ICD-10-CM | POA: Diagnosis not present

## 2017-07-03 DIAGNOSIS — Z79899 Other long term (current) drug therapy: Secondary | ICD-10-CM

## 2017-07-03 DIAGNOSIS — R109 Unspecified abdominal pain: Secondary | ICD-10-CM

## 2017-07-03 DIAGNOSIS — K449 Diaphragmatic hernia without obstruction or gangrene: Secondary | ICD-10-CM | POA: Diagnosis present

## 2017-07-03 DIAGNOSIS — D62 Acute posthemorrhagic anemia: Secondary | ICD-10-CM | POA: Diagnosis not present

## 2017-07-03 DIAGNOSIS — R0600 Dyspnea, unspecified: Secondary | ICD-10-CM

## 2017-07-03 DIAGNOSIS — M1611 Unilateral primary osteoarthritis, right hip: Secondary | ICD-10-CM | POA: Diagnosis present

## 2017-07-03 DIAGNOSIS — Z7901 Long term (current) use of anticoagulants: Secondary | ICD-10-CM

## 2017-07-03 DIAGNOSIS — D7582 Heparin induced thrombocytopenia (HIT): Secondary | ICD-10-CM | POA: Diagnosis not present

## 2017-07-03 DIAGNOSIS — Z4659 Encounter for fitting and adjustment of other gastrointestinal appliance and device: Secondary | ICD-10-CM

## 2017-07-03 DIAGNOSIS — Z90710 Acquired absence of both cervix and uterus: Secondary | ICD-10-CM

## 2017-07-03 DIAGNOSIS — R141 Gas pain: Secondary | ICD-10-CM | POA: Diagnosis not present

## 2017-07-03 DIAGNOSIS — K5900 Constipation, unspecified: Secondary | ICD-10-CM | POA: Diagnosis not present

## 2017-07-03 LAB — SURGICAL PCR SCREEN
MRSA, PCR: NEGATIVE
STAPHYLOCOCCUS AUREUS: NEGATIVE

## 2017-07-03 LAB — CBC WITH DIFFERENTIAL/PLATELET
BASOS PCT: 0 %
Basophils Absolute: 0 10*3/uL (ref 0.0–0.1)
Eosinophils Absolute: 0.2 10*3/uL (ref 0.0–0.7)
Eosinophils Relative: 2 %
HCT: 40.5 % (ref 36.0–46.0)
Hemoglobin: 13.9 g/dL (ref 12.0–15.0)
Lymphocytes Relative: 23 %
Lymphs Abs: 2 10*3/uL (ref 0.7–4.0)
MCH: 32.8 pg (ref 26.0–34.0)
MCHC: 34.3 g/dL (ref 30.0–36.0)
MCV: 95.5 fL (ref 78.0–100.0)
MONO ABS: 0.6 10*3/uL (ref 0.1–1.0)
MONOS PCT: 7 %
NEUTROS PCT: 68 %
Neutro Abs: 5.9 10*3/uL (ref 1.7–7.7)
PLATELETS: 169 10*3/uL (ref 150–400)
RBC: 4.24 MIL/uL (ref 3.87–5.11)
RDW: 13.2 % (ref 11.5–15.5)
WBC: 8.6 10*3/uL (ref 4.0–10.5)

## 2017-07-03 LAB — BASIC METABOLIC PANEL
Anion gap: 11 (ref 5–15)
BUN: 21 mg/dL — ABNORMAL HIGH (ref 6–20)
CALCIUM: 10.1 mg/dL (ref 8.9–10.3)
CO2: 27 mmol/L (ref 22–32)
Chloride: 95 mmol/L — ABNORMAL LOW (ref 101–111)
Creatinine, Ser: 0.86 mg/dL (ref 0.44–1.00)
GFR calc non Af Amer: 56 mL/min — ABNORMAL LOW (ref >60)
GLUCOSE: 164 mg/dL — AB (ref 65–99)
Potassium: 3.8 mmol/L (ref 3.5–5.1)
Sodium: 133 mmol/L — ABNORMAL LOW (ref 135–145)

## 2017-07-03 LAB — PROTIME-INR
INR: 1.08
Prothrombin Time: 14 seconds (ref 11.4–15.2)

## 2017-07-03 LAB — HEPARIN LEVEL (UNFRACTIONATED): Heparin Unfractionated: 1.34 IU/mL — ABNORMAL HIGH (ref 0.30–0.70)

## 2017-07-03 LAB — APTT: aPTT: 33 seconds (ref 24–36)

## 2017-07-03 MED ORDER — LOSARTAN POTASSIUM 50 MG PO TABS
50.0000 mg | ORAL_TABLET | Freq: Every evening | ORAL | Status: DC
  Administered 2017-07-03 – 2017-07-06 (×4): 50 mg via ORAL
  Filled 2017-07-03 (×4): qty 1

## 2017-07-03 MED ORDER — METOPROLOL SUCCINATE ER 25 MG PO TB24
25.0000 mg | ORAL_TABLET | Freq: Two times a day (BID) | ORAL | Status: DC
  Administered 2017-07-03 – 2017-07-04 (×3): 25 mg via ORAL
  Filled 2017-07-03 (×3): qty 1

## 2017-07-03 MED ORDER — ORAL CARE MOUTH RINSE
15.0000 mL | Freq: Two times a day (BID) | OROMUCOSAL | Status: DC
  Administered 2017-07-03 – 2017-07-16 (×21): 15 mL via OROMUCOSAL

## 2017-07-03 MED ORDER — AMLODIPINE BESYLATE 5 MG PO TABS
10.0000 mg | ORAL_TABLET | Freq: Every day | ORAL | Status: DC
  Administered 2017-07-04 – 2017-07-07 (×3): 10 mg via ORAL
  Filled 2017-07-03 (×3): qty 2

## 2017-07-03 MED ORDER — ADULT MULTIVITAMIN W/MINERALS CH
1.0000 | ORAL_TABLET | Freq: Every day | ORAL | Status: DC
  Administered 2017-07-04 – 2017-07-06 (×2): 1 via ORAL
  Filled 2017-07-03 (×2): qty 1

## 2017-07-03 MED ORDER — HEPARIN (PORCINE) IN NACL 100-0.45 UNIT/ML-% IJ SOLN
700.0000 [IU]/h | INTRAMUSCULAR | Status: AC
  Administered 2017-07-03: 700 [IU]/h via INTRAVENOUS
  Filled 2017-07-03: qty 250

## 2017-07-03 MED ORDER — CALCIUM CARBONATE-VITAMIN D 500-200 MG-UNIT PO TABS
1.0000 | ORAL_TABLET | Freq: Every day | ORAL | Status: DC
  Administered 2017-07-04 – 2017-07-06 (×2): 1 via ORAL
  Filled 2017-07-03 (×2): qty 1

## 2017-07-03 MED ORDER — ONDANSETRON HCL 4 MG/2ML IJ SOLN
4.0000 mg | Freq: Four times a day (QID) | INTRAMUSCULAR | Status: DC | PRN
  Administered 2017-07-03 – 2017-07-06 (×3): 4 mg via INTRAVENOUS
  Filled 2017-07-03 (×3): qty 2

## 2017-07-03 MED ORDER — ATORVASTATIN CALCIUM 10 MG PO TABS
10.0000 mg | ORAL_TABLET | Freq: Every day | ORAL | Status: DC
  Administered 2017-07-03 – 2017-07-06 (×4): 10 mg via ORAL
  Filled 2017-07-03 (×4): qty 1

## 2017-07-03 MED ORDER — CARBAMIDE PEROXIDE 6.5 % OT SOLN
5.0000 [drp] | OTIC | Status: DC
  Administered 2017-07-07 – 2017-07-14 (×2): 5 [drp] via OTIC
  Filled 2017-07-03 (×2): qty 15

## 2017-07-03 MED ORDER — MORPHINE SULFATE (PF) 2 MG/ML IV SOLN
0.5000 mg | INTRAVENOUS | Status: DC | PRN
  Administered 2017-07-03 – 2017-07-05 (×6): 0.5 mg via INTRAVENOUS
  Filled 2017-07-03 (×6): qty 1

## 2017-07-03 MED ORDER — POLYETHYLENE GLYCOL 3350 17 G PO PACK
17.0000 g | PACK | Freq: Every day | ORAL | Status: DC | PRN
  Administered 2017-07-05 – 2017-07-06 (×2): 17 g via ORAL
  Filled 2017-07-03 (×2): qty 1

## 2017-07-03 MED ORDER — HYDROCODONE-ACETAMINOPHEN 5-325 MG PO TABS
1.0000 | ORAL_TABLET | Freq: Four times a day (QID) | ORAL | Status: DC | PRN
  Administered 2017-07-03 – 2017-07-05 (×3): 1 via ORAL
  Administered 2017-07-06: 2 via ORAL
  Administered 2017-07-15 – 2017-07-16 (×2): 1 via ORAL
  Filled 2017-07-03: qty 1
  Filled 2017-07-03: qty 2
  Filled 2017-07-03 (×4): qty 1

## 2017-07-03 MED ORDER — ONDANSETRON HCL 4 MG/2ML IJ SOLN
4.0000 mg | Freq: Once | INTRAMUSCULAR | Status: AC
  Administered 2017-07-03: 4 mg via INTRAVENOUS
  Filled 2017-07-03: qty 2

## 2017-07-03 MED ORDER — DOCUSATE SODIUM 100 MG PO CAPS
100.0000 mg | ORAL_CAPSULE | Freq: Every day | ORAL | Status: DC
  Administered 2017-07-04 – 2017-07-06 (×2): 100 mg via ORAL
  Filled 2017-07-03 (×2): qty 1

## 2017-07-03 MED ORDER — OMEGA-3-ACID ETHYL ESTERS 1 G PO CAPS
1.0000 g | ORAL_CAPSULE | Freq: Every day | ORAL | Status: DC
  Administered 2017-07-04 – 2017-07-06 (×2): 1 g via ORAL
  Filled 2017-07-03 (×2): qty 1

## 2017-07-03 MED ORDER — GRAPE SEED EXTRACT 100 MG PO CAPS: 100.0000 mg | ORAL_CAPSULE | Freq: Every day | ORAL | Status: DC

## 2017-07-03 NOTE — Progress Notes (Addendum)
ANTICOAGULATION CONSULT NOTE - Initial Consult  Pharmacy Consult for Heparin Indication: atrial fibrillation  Allergies  Allergen Reactions  . Sulfa Antibiotics Other (See Comments)    unknown    Patient Measurements: Height: 5\' 3"  (160 cm) Weight: 112 lb (50.8 kg) IBW/kg (Calculated) : 52.4 HEPARIN DW (KG): 50.8  Vital Signs: Temp: 97.5 F (36.4 C) (08/15 1041) Temp Source: Oral (08/15 1041) BP: 166/57 (08/15 1530) Pulse Rate: 57 (08/15 1557)  Labs:  Recent Labs  07/03/17 1218  HGB 13.9  HCT 40.5  PLT 169  LABPROT 14.0  INR 1.08  CREATININE 0.86    Estimated Creatinine Clearance: 32.8 mL/min (by C-G formula based on SCr of 0.86 mg/dL).   Medical History: Past Medical History:  Diagnosis Date  . Atrial fibrillation (HCC)   . GERD (gastroesophageal reflux disease)   . Hypertension   . Stroke [facility](HCC)     Medications:  See med rec Assessment: 83 y.o. female with medical history of stroke, essential hypertension, hyperlipidemia, and atrial fibrillation presented with a mechanical fall onto her right side while at an exercise dance class on the morning of 07/03/2017. Xray show subcapital femur fracture. Patient is on chronic anticoagulation with apixaban and last dose was this am at 0800. Will start heparin at 2000. Plan is for surgery 8/17 Friday. Heparin is to be stopped at midnight of 8/16 Thursday.   Goal of Therapy:  Heparin level 0.3-0.7 units/ml aPTT 66-102 seconds Monitor platelets by anticoagulation protocol: Yes   Plan:  Start heparin infusion at 700 units/hr at 2000 Check anti-Xa level in 8 hours and daily while on heparin Continue to monitor H&H and platelets APTT baseline and with anti-Xa level Stop heparin 8/16 Thursday at midnight  Elder CyphersLorie Bryor Rami, BS Pharm D, [place] Clinical Pharmacist Pager #[phone] 07/03/2017,4:06 PM

## 2017-07-03 NOTE — ED Notes (Signed)
Bilateral Lower extremities CMS wnl.

## 2017-07-03 NOTE — ED Provider Notes (Signed)
[facility]-EMERGENCY DEPT Provider Note   CSN: [id number] Arrival date & time: 07/03/17  1034     History   Chief Complaint Chief Complaint  Patient presents with  . Fall    HPI Theresa Kennedy is a 83 y.o. female.  HPI Patient presents after fall. She was at a dance aerobics and tripped and fell. Now pain in her right hip. Has not ambulated since does not think should be able to. No other injury. She is on an anticoagulation for atrial fibrillation. No abdominal pain. No chest pain. Pain is in her right hip and goes into her groin. Past Medical History:  Diagnosis Date  . Atrial fibrillation (HCC)   . GERD (gastroesophageal reflux disease)   . Hypertension   . Stroke [facility](HCC)     Patient Active Problem List   Diagnosis Date Noted  . Stroke (cerebrum) (HCC) 11/20/2015  . TIA (transient ischemic attack) 11/20/2015  . Abdominal pain 02/12/2015  . HLD (hyperlipidemia) 02/12/2015  . GERD (gastroesophageal reflux disease) 02/12/2015  . Hyponatremia 02/12/2015  . Nausea & vomiting   . Ileus, postoperative (HCC)   . right frontal infarct secondary to afib 04/21/2013  . New onset a-fib (HCC) 04/21/2013  . Atrial fibrillation, persistent (HCC) 04/21/2013  . Slurred speech 04/21/2013  . Acute respiratory failure with hypoxia (HCC) 04/21/2013  . Aphasia 04/20/2013  . Hyperglycemia 04/20/2013  . HTN (hypertension) 04/20/2013    Past Surgical History:  Procedure Laterality Date  . ABDOMINAL HYSTERECTOMY      OB History    No data available       Home Medications    Prior to Admission medications   Medication Sig Start Date End Date Taking? Authorizing Provider  amLODipine (NORVASC) 10 MG tablet Take 10 mg by mouth every evening.     [provider]  apixaban (ELIQUIS) 2.5 MG TABS tablet Take 1 tablet (2.5 mg total) by mouth 2 (two) times daily. 11/21/15   Ghimire, Werner LeanShanker M, MD  atorvastatin (LIPITOR) 10 MG tablet Take 10 mg by mouth daily at 6 PM.  02/06/16    [provider]  Calcium Carbonate-Vitamin D 600-200 MG-UNIT TABS Take 1 tablet by mouth daily.    [provider]  carbamide peroxide (DEBROX) 6.5 % otic solution Place 5 drops into both ears 2 (two) times daily. Patient taking differently: Place 5 drops into both ears once a week. USE ONCE A WEEK 02/16/15   Leroy SeaSingh, Prashant K, MD  docusate sodium (COLACE) 100 MG capsule Take 1 capsule (100 mg total) by mouth daily. 02/16/15   Leroy SeaSingh, Prashant K, MD  Grape Seed Extract 100 MG CAPS Take 100 mg by mouth daily.     [provider]  losartan (COZAAR) 25 MG tablet Take 50 mg by mouth every evening.  07/19/14   Rollene RotundaHochrein, James, MD  metoprolol succinate (TOPROL-XL) 25 MG 24 hr tablet Take 25 mg by mouth 2 (two) times daily.    [provider]  Multiple Vitamin (MULTIVITAMIN WITH MINERALS) TABS tablet Take 1 tablet by mouth daily.    [provider]  Omega-3 Fatty Acids (FISH OIL OMEGA-3 PO) Take by mouth.    [provider]  Omega-3 Fatty Acids (FISH OIL) 1000 MG CAPS Take by mouth.    [provider]  psyllium (METAMUCIL) 58.6 % packet Take 1 packet by mouth daily.    [provider]  ranitidine (ZANTAC) 150 MG capsule Take by mouth 2 (two) times daily.  02/08/16 02/07/17  [provider]    Family History Family History  Problem Relation Age of Onset  . Stroke Mother   . Leukemia Brother   . Hypertension Father   . Breast cancer Sister     Social History Social History  Substance Use Topics  . Smoking status: Never Smoker  . Smokeless tobacco: Never Used  . Alcohol use No     Allergies   Sulfa antibiotics   Review of Systems Review of Systems  Constitutional: Negative for appetite change.  HENT: Negative for congestion.   Respiratory: Negative for shortness of breath.   Cardiovascular: Negative for chest pain.  Gastrointestinal: Negative for abdominal distention.  Genitourinary: Negative for frequency.    Musculoskeletal: Negative for back pain.       Right hip pain  Neurological: Negative for speech difficulty.  Hematological: Negative for adenopathy.  Psychiatric/Behavioral: Negative for confusion.     Physical Exam Updated Vital Signs BP (!) 141/71   Pulse 68   Temp (!) 97.5 F (36.4 C) (Oral)   Resp 18   Ht 5\' 3"  (1.6 m)   Wt 50.8 kg (112 lb)   SpO2 93%   BMI 19.84 kg/m   Physical Exam  Constitutional: She appears well-developed.  HENT:  Head: Normocephalic.  Eyes: Pupils are equal, round, and reactive to light.  Neck: Neck supple.  Cardiovascular: Normal rate.   Pulmonary/Chest: Effort normal.  Abdominal: Soft. There is no tenderness.  Musculoskeletal: She exhibits tenderness.  Decreased range of motion and right hip. Some pain with movement of the lower extremity. Pelvis is stable. No tenderness over pubic symphysis. Neurovascularly intact in bilateral feet. No lumbar tenderness.  Skin: Skin is warm. Capillary refill takes less than 2 seconds.  Psychiatric: She has a normal mood and affect.     ED Treatments / Results  Labs (all labs ordered are listed, but only abnormal results are displayed) Labs Reviewed  CBC WITH DIFFERENTIAL/PLATELET  PROTIME-INR  BASIC METABOLIC PANEL  TYPE AND SCREEN    EKG  EKG Interpretation  Date/Time:  Wednesday July 03 2017 11:12:22 EDT Ventricular Rate:  70 PR Interval:    QRS Duration: 98 QT Interval:  400 QTC Calculation: 432 R Axis:   -84 Text Interpretation:  Atrial fibrillation Left anterior fascicular block Abnormal R-wave progression, early transition Probable left ventricular hypertrophy Confirmed by Theresa Kennedy [phone]) on 07/03/2017 12:22:45 PM       Radiology Dg Chest 1 View  Result Date: 07/03/2017 CLINICAL DATA:  Hip fracture EXAM: CHEST 1 VIEW COMPARISON:  11/21/2015 FINDINGS: COPD with hyperinflation. Extensive apical scarring bilaterally unchanged. Negative for heart failure or pneumonia.  Negative for pleural effusion. IMPRESSION: COPD with apical scarring. No acute abnormality and unchanged from the prior study. Electronically Signed   By: Marlan Palau M.D.   On: 07/03/2017 12:01   Dg Hip Unilat With Pelvis 2-3 Views Right  Result Date: 07/03/2017 CLINICAL DATA:  Fall.  Right hip pain EXAM: DG HIP (WITH OR WITHOUT PELVIS) 2-3V RIGHT COMPARISON:  None. FINDINGS: Right femoral neck fracture with angulation and impaction. Fracture is in the subcapital region of the femoral neck. Calcification of the glenoid labrum. Joint space normal. No other pelvic fracture. Advanced disc degeneration L5-S1 IMPRESSION: Subcapital angulated fracture of the right femur. Electronically Signed   By: Marlan Palau M.D.   On: 07/03/2017 12:03    Procedures Procedures (including critical care time)  Medications Ordered in ED Medications  ondansetron (ZOFRAN) injection 4 mg (4 mg Intravenous  Given 07/03/17 1228)     Initial Impression / Assessment and Plan / ED Course  I have reviewed the triage vital signs and the nursing notes.  Pertinent labs & imaging results that were available during my care of the patient were reviewed by me and considered in my medical decision making (see chart for details).     Patient presents after fall. She is on a liquids. Only injury is right subcapital hip fracture. Discussed with Dr. Romeo Apple who recommended transfer to [place] so could be done earlier since it would likely be done under spinal anesthesia here. Discussed with Dr. Shon Baton at [facility]. He states that since he does not do the hip replacements. Have to wait for a partner of his to do it would likely be done about the same time. A call back with Dr. Romeo Apple again and he will keep the patient up here. He hopes of surgery can get done before the weekend. Will admit to hospitalist.  Final Clinical Impressions(s) / ED Diagnoses   Final diagnoses:  Closed subcapital fracture of right femur,  initial encounter [facility])    New Prescriptions New Prescriptions   No medications on file     Theresa Core, MD 07/03/17 1308

## 2017-07-03 NOTE — Progress Notes (Signed)
Family reported to RN that patient did hit her head on a piece of  Equipment when she fell.  This [facility] of [facility].  Patient denies any symptoms or LOC.  No bruising or raised area noted to head.  MD made aware via text page.

## 2017-07-03 NOTE — Progress Notes (Signed)
Patient ID: Theresa CooperBessie E Kennedy, female   DOB: 09/29/1921, 83 y.o.   MRN: [id number] 83 yo afib on eloquis Right hip fracture   The surgery can be done here with spinal  I recomended transfer to [facility] so that it can be done under general tomorrow  That was refused bu [place]  So after 48 hrs the surgery can be done here with a spinal   IF there is a complication before Friday or a delay on Friday then she will still have to be transferred because it can not be done with a CRNA on the week end with this patients medical history

## 2017-07-03 NOTE — ED Notes (Signed)
CMS intact bilaterally.

## 2017-07-03 NOTE — ED Notes (Signed)
Pt back from XR.  CMS intact on bilateral lower extremities

## 2017-07-03 NOTE — ED Notes (Signed)
Attempted report x 1. Nurse will call back

## 2017-07-03 NOTE — H&P (Signed)
History and Physical  TRAMEKA DOROUGH ZOX:[id number] DOB: [birth year] DOA: 07/03/2017   PCP: Joette Catching, MD   Patient coming from: Home  Chief Complaint: right hip pain  HPI:  Theresa Kennedy is a 83 y.o. female with medical history of stroke, essential hypertension, hyperlipidemia, and atrial fibrillation presented with a mechanical fall onto her right side while at an exercise dance class on the morning of 07/03/2017. The patient states that her feet got tangled up and resulted in a mechanical fall onto her right side. Because she had immense pain, and she was not able to get up, EMS was activated. Patient denies fevers, chills, headache, chest pain, dyspnea, nausea, vomiting, diarrhea, abdominal pain, dysuria, hematuria, hematochezia, and melena. In the emergency department, BMP and CBC were essentially unremarkable except for a sodium of 133. Chest x-ray showed apical scarring and hyperinflation. X-ray of the right hip revealed a subcapital angulated fracture of the right femur. EKG showed atrial fibrillation with nonspecific T-wave changes.  Assessment/Plan: Subcapital right femur fracture  -Dr. Romeo Apple has been consulted -d/c apixaban in preparation for surgery on 07/05/17 -start heparin drip--stop at 0001 on 07/05/17 -pain control -PT/OT after surgery  Atrial fibrillation  -Rate controlled  -Holding apixaban in preparation for surgery  -start heparin drip--stop at 0001 on 07/05/17 -Continue metoprolol succinate   Essential hypertension -Continue amlodipine, losartan, metoprolol succinate  Hx of stroke -occurred in 2014 -pt without difficulty in performing ADLs -IV heparin bridge  Hyponatremia -chronic -am BMP      Past Medical History:  Diagnosis Date  . Atrial fibrillation (HCC)   . GERD (gastroesophageal reflux disease)   . Hypertension   . Stroke [facility])    Past Surgical History:  Procedure Laterality Date  . ABDOMINAL HYSTERECTOMY     Social  History:  reports that she has never smoked. She has never used smokeless tobacco. She reports that she does not drink alcohol or use drugs.   Family History  Problem Relation Age of Onset  . Stroke Mother   . Leukemia Brother   . Hypertension Father   . Breast cancer Sister      Allergies  Allergen Reactions  . Sulfa Antibiotics Other (See Comments)    unknown     Prior to Admission medications   Medication Sig Start Date End Date Taking? Authorizing Provider  amLODipine (NORVASC) 10 MG tablet Take 10 mg by mouth every evening.     [provider]  apixaban (ELIQUIS) 2.5 MG TABS tablet Take 1 tablet (2.5 mg total) by mouth 2 (two) times daily. 11/21/15   Ghimire, Werner Lean, MD  atorvastatin (LIPITOR) 10 MG tablet Take 10 mg by mouth daily at 6 PM.  02/06/16   [provider]  Calcium Carbonate-Vitamin D 600-200 MG-UNIT TABS Take 1 tablet by mouth daily.    [provider]  carbamide peroxide (DEBROX) 6.5 % otic solution Place 5 drops into both ears 2 (two) times daily. Patient taking differently: Place 5 drops into both ears once a week. USE ONCE A WEEK 02/16/15   Leroy Sea, MD  docusate sodium (COLACE) 100 MG capsule Take 1 capsule (100 mg total) by mouth daily. 02/16/15   Leroy Sea, MD  Grape Seed Extract 100 MG CAPS Take 100 mg by mouth daily.     [provider]  losartan (COZAAR) 25 MG tablet Take 50 mg by mouth every evening.  07/19/14   Rollene Rotunda, MD  metoprolol succinate (  TOPROL-XL) 25 MG 24 hr tablet Take 25 mg by mouth 2 (two) times daily.    [provider]  Multiple Vitamin (MULTIVITAMIN WITH MINERALS) TABS tablet Take 1 tablet by mouth daily.    [provider]  Omega-3 Fatty Acids (FISH OIL OMEGA-3 PO) Take by mouth.    [provider]  Omega-3 Fatty Acids (FISH OIL) 1000 MG CAPS Take by mouth.    [provider]  psyllium (METAMUCIL) 58.6 % packet Take 1 packet by mouth daily.     [provider]  ranitidine (ZANTAC) 150 MG capsule Take by mouth 2 (two) times daily.  02/08/16 02/07/17  [provider]    Review of Systems:  Constitutional:  No weight loss, night sweats, Fevers, chills, fatigue.  Head&Eyes: No headache.  No vision loss.  No eye pain or scotoma ENT:  No Difficulty swallowing,Tooth/dental problems,Sore throat,  No ear ache, post nasal drip,  Cardio-vascular:  No chest pain, Orthopnea, PND, swelling in lower extremities,  dizziness, palpitations  GI:  No  abdominal pain, nausea, vomiting, diarrhea, loss of appetite, hematochezia, melena, heartburn, indigestion, Resp:  No shortness of breath with exertion or at rest. No cough. No coughing up of blood .No wheezing.No chest wall deformity  Skin:  no rash or lesions.  GU:  no dysuria, change in color of urine, no urgency or frequency. No flank pain.  Musculoskeletal:  Complains of right hip pain.  No back pain.  Psych:  No change in mood or affect. No depression or anxiety. Neurologic: No headache, no dysesthesia, no focal weakness, no vision loss. No syncope  Physical Exam: Vitals:   07/03/17 1041 07/03/17 1100 07/03/17 1200 07/03/17 1230  BP: (!) 179/89 (!) 143/66 (!) 147/66 (!) 141/71  Pulse: 87 87  68  Resp: 18     Temp: (!) 97.5 F (36.4 C)     TempSrc: Oral     SpO2: 98% 98% 97% 93%  Weight:      Height:       General:  A&O x 3, NAD, nontoxic, pleasant/cooperative Head/Eye: No conjunctival hemorrhage, no icterus, [place]/AT, No nystagmus ENT:  No icterus,  No thrush, good dentition, no pharyngeal exudate Neck:  No masses, no lymphadenpathy, no bruits CV:  IRRR, no rub, no gallop, no S3 Lung:  bibasilar rales. No wheezing, good air movement, no wheeze, no rhonchi Abdomen: soft/NT, +BS, nondistended, no peritoneal signs Ext: No cyanosis, No rashes, No petechiae, No lymphangitis, No edema Neuro: CNII-XII intact, strength 4/5 in bilateral upper and lower extremities, no  dysmetria  Labs on Admission:  Basic Metabolic Panel:  Recent Labs Lab 07/03/17 1218  NA 133*  K 3.8  CL 95*  CO2 27  GLUCOSE 164*  BUN 21*  CREATININE 0.86  CALCIUM 10.1   Liver Function Tests: No results for input(s): AST, ALT, ALKPHOS, BILITOT, PROT, ALBUMIN in the last 168 hours. No results for input(s): LIPASE, AMYLASE in the last 168 hours. No results for input(s): AMMONIA in the last 168 hours. CBC:  Recent Labs Lab 07/03/17 1218  WBC 8.6  NEUTROABS 5.9  HGB 13.9  HCT 40.5  MCV 95.5  PLT 169   Coagulation Profile:  Recent Labs Lab 07/03/17 1218  INR 1.08   Cardiac Enzymes: No results for input(s): CKTOTAL, CKMB, CKMBINDEX, TROPONINI in the last 168 hours. BNP: Invalid input(s): POCBNP CBG: No results for input(s): GLUCAP in the last 168 hours. Urine analysis:    Component Value Date/Time   COLORURINE  YELLOW 11/21/2015 1337   APPEARANCEUR CLEAR 11/21/2015 1337   LABSPEC 1.010 11/21/2015 1337   PHURINE 7.5 11/21/2015 1337   GLUCOSEU NEGATIVE 11/21/2015 1337   HGBUR NEGATIVE 11/21/2015 1337   BILIRUBINUR NEGATIVE 11/21/2015 1337   KETONESUR NEGATIVE 11/21/2015 1337   PROTEINUR NEGATIVE 11/21/2015 1337   UROBILINOGEN 0.2 11/21/2014 1341   NITRITE NEGATIVE 11/21/2015 1337   LEUKOCYTESUR NEGATIVE 11/21/2015 1337   Sepsis Labs: @LABRCNTIP (procalcitonin:4,lacticidven:4) )No results found for this or any previous visit (from the past 240 hour(s)).   Radiological Exams on Admission: Dg Chest 1 View  Result Date: 07/03/2017 CLINICAL DATA:  Hip fracture EXAM: CHEST 1 VIEW COMPARISON:  11/21/2015 FINDINGS: COPD with hyperinflation. Extensive apical scarring bilaterally unchanged. Negative for heart failure or pneumonia. Negative for pleural effusion. IMPRESSION: COPD with apical scarring. No acute abnormality and unchanged from the prior study. Electronically Signed   By: Marlan Palau M.D.   On: 07/03/2017 12:01   Dg Hip Unilat With Pelvis 2-3 Views  Right  Result Date: 07/03/2017 CLINICAL DATA:  Fall.  Right hip pain EXAM: DG HIP (WITH OR WITHOUT PELVIS) 2-3V RIGHT COMPARISON:  None. FINDINGS: Right femoral neck fracture with angulation and impaction. Fracture is in the subcapital region of the femoral neck. Calcification of the glenoid labrum. Joint space normal. No other pelvic fracture. Advanced disc degeneration L5-S1 IMPRESSION: Subcapital angulated fracture of the right femur. Electronically Signed   By: Marlan Palau M.D.   On: 07/03/2017 12:03    EKG: Independently reviewed. Atrial fibrillation, nonspecific T-wave change    Time spent:60 minutes Code Status:   FULL Family Communication:  No Family at bedside Disposition Plan: expect 3 day hospitalization Consults called: Ortho--Dr. Romeo Apple DVT Prophylaxis: IV heparin  Aleese Kamps, DO  [facility] Pager [phone]  If 7PM-7AM, please contact night-coverage [email or web address] Password [email or web address] 07/03/2017, 1:57 PM

## 2017-07-03 NOTE — ED Notes (Signed)
EKG given to Dr. Rubin PayorPickering.  Pt taken to XRAY by Donald PoreJorge.

## 2017-07-03 NOTE — ED Notes (Addendum)
Pt requesting to speak to pharmacy tech to verify medication. Pharmacy tech notified.

## 2017-07-03 NOTE — ED Triage Notes (Signed)
Per EMS: Pt was at dance aerobics today, tripped and fell on right hip, has shortening and rotation of hip.  Pt alert and oriented. Pt given 6mg  morphine IV. Pain 5/10 at this time.

## 2017-07-03 NOTE — ED Notes (Signed)
Pt placed on 2L [place] for 02 sats occasionally dropping to 90%.

## 2017-07-04 DIAGNOSIS — I10 Essential (primary) hypertension: Secondary | ICD-10-CM

## 2017-07-04 DIAGNOSIS — S728X1A Other fracture of right femur, initial encounter for closed fracture: Secondary | ICD-10-CM

## 2017-07-04 LAB — ABO/RH: ABO/RH(D): O POS

## 2017-07-04 LAB — BASIC METABOLIC PANEL
Anion gap: 8 (ref 5–15)
BUN: 21 mg/dL — ABNORMAL HIGH (ref 6–20)
CHLORIDE: 94 mmol/L — AB (ref 101–111)
CO2: 30 mmol/L (ref 22–32)
Calcium: 9.3 mg/dL (ref 8.9–10.3)
Creatinine, Ser: 0.72 mg/dL (ref 0.44–1.00)
GFR calc Af Amer: >60 mL/min (ref >60)
GLUCOSE: 123 mg/dL — AB (ref 65–99)
POTASSIUM: 4.1 mmol/L (ref 3.5–5.1)
Sodium: 132 mmol/L — ABNORMAL LOW (ref 135–145)

## 2017-07-04 LAB — PREPARE RBC (CROSSMATCH)

## 2017-07-04 LAB — CBC
HEMATOCRIT: 38.7 % (ref 36.0–46.0)
Hemoglobin: 13.4 g/dL (ref 12.0–15.0)
MCH: 32.7 pg (ref 26.0–34.0)
MCHC: 34.6 g/dL (ref 30.0–36.0)
MCV: 94.4 fL (ref 78.0–100.0)
PLATELETS: 159 10*3/uL (ref 150–400)
RBC: 4.1 MIL/uL (ref 3.87–5.11)
RDW: 13 % (ref 11.5–15.5)
WBC: 11.3 10*3/uL — ABNORMAL HIGH (ref 4.0–10.5)

## 2017-07-04 LAB — HEPARIN LEVEL (UNFRACTIONATED): Heparin Unfractionated: 0.96 IU/mL — ABNORMAL HIGH (ref 0.30–0.70)

## 2017-07-04 LAB — APTT: aPTT: 95 seconds — ABNORMAL HIGH (ref 24–36)

## 2017-07-04 MED ORDER — POVIDONE-IODINE 10 % EX SWAB: 2.0000 "application " | Freq: Once | CUTANEOUS | Status: DC

## 2017-07-04 MED ORDER — CEFAZOLIN SODIUM-DEXTROSE 2-4 GM/100ML-% IV SOLN
2.0000 g | INTRAVENOUS | Status: AC
  Administered 2017-07-05: 2 g via INTRAVENOUS
  Filled 2017-07-04 (×2): qty 100

## 2017-07-04 MED ORDER — CHLORHEXIDINE GLUCONATE 4 % EX LIQD
60.0000 mL | Freq: Once | CUTANEOUS | Status: AC
  Administered 2017-07-05: 4 via TOPICAL
  Filled 2017-07-04: qty 15

## 2017-07-04 MED ORDER — ENSURE ENLIVE PO LIQD
237.0000 mL | Freq: Two times a day (BID) | ORAL | Status: DC
  Administered 2017-07-04 – 2017-07-05 (×2): 237 mL via ORAL

## 2017-07-04 MED ORDER — SODIUM CHLORIDE 0.9 % IV SOLN: Freq: Once | INTRAVENOUS | Status: AC

## 2017-07-04 NOTE — Progress Notes (Signed)
PROGRESS NOTE  Theresa Kennedy WUJ:[id number] DOB: July 25, 1921 DOA: 07/03/2017 PCP: Joette Catching, MD  Brief History:  83 y.o. female with medical history of stroke, essential hypertension, hyperlipidemia, and atrial fibrillation presented with a mechanical fall onto her right side while at an exercise dance class on the morning of 07/03/2017. The patient states that her feet got tangled up and resulted in a mechanical fall onto her right side. Because she had immense pain, and she was not able to get up, EMS was activated. Patient denies fevers, chills, headache, chest pain, dyspnea, nausea, vomiting, diarrhea, abdominal pain, dysuria, hematuria, hematochezia, and melena. In the emergency department, BMP and CBC were essentially unremarkable except for a sodium of 133. Chest x-ray showed apical scarring and hyperinflation. X-ray of the right hip revealed a subcapital angulated fracture of the right femur. EKG showed atrial fibrillation with nonspecific T-wave changes.  Assessment/Plan: Subcapital right femur fracture  -Dr. Romeo Apple plans surgery 07/05/17 -d/c apixaban in preparation for surgery on 07/05/17 -start heparin drip--stop at 0001 on 07/05/17 -pain control -PT/OT after surgery  Atrial fibrillation  -Rate controlled  -Holding apixaban in preparation for surgery  -start heparin drip--stop at 0001 on 07/05/17 -Continue metoprolol succinate   Essential hypertension -Continue amlodipine, losartan, metoprolol succinate  Hx of stroke -occurred in 2014 -pt without difficulty in performing ADLs -IV heparin bridge -07/03/2017 CT brain negative for acute findings. Reveals old right frontal lobe infarct. -Continue statin  Hyponatremia -chronic -am BMP   Disposition Plan:   Home in 2-3 days  Family Communication:   Sister updated at bedside 8/16  Consultants:  Ortho--[facility]  Code Status:  FULL   DVT Prophylaxis:  IV Heparin   Procedures: As Listed in Progress  Note Above  Antibiotics: None    Subjective: Patient denies fevers, chills, headache, chest pain, dyspnea, nausea, vomiting, diarrhea, abdominal pain, dysuria, hematuria, hematochezia, and melena. The patient complains of some pain in her right hip with any type of movement.  Objective: Vitals:   07/03/17 2122 07/04/17 0102 07/04/17 0500 07/04/17 1519  BP:  137/74 (!) 147/69 (!) 143/66  Pulse:  74 60 84  Resp:  18 18 18   Temp:   97.6 F (36.4 C) 97.6 F (36.4 C)  TempSrc:    Oral  SpO2: 96% 100% 100% 99%  Weight:      Height:        Intake/Output Summary (Last 24 hours) at 07/04/17 1846 Last data filed at 07/04/17 1600  Gross per 24 hour  Intake           127.63 ml  Output              475 ml  Net          -347.37 ml   Weight change:  Exam:   General:  Pt is alert, follows commands appropriately, not in acute distress  HEENT: No icterus, No thrush, No neck mass, [place]/AT  Cardiovascular: RRR, S1/S2, no rubs, no gallops  Respiratory: CTA bilaterally, no wheezing, no crackles, no rhonchi  Abdomen: Soft/+BS, non tender, non distended, no guarding  Extremities: No edema, No lymphangitis, No petechiae, No rashes, no synovitis   Data Reviewed: I have personally reviewed following labs and imaging studies Basic Metabolic Panel:  Recent Labs Lab 07/03/17 1218 07/04/17 0552  NA 133* 132*  K 3.8 4.1  CL 95* 94*  CO2 27 30  GLUCOSE 164* 123*  BUN 21* 21*  CREATININE 0.86 0.72  CALCIUM 10.1 9.3   Liver Function Tests: No results for input(s): AST, ALT, ALKPHOS, BILITOT, PROT, ALBUMIN in the last 168 hours. No results for input(s): LIPASE, AMYLASE in the last 168 hours. No results for input(s): AMMONIA in the last 168 hours. Coagulation Profile:  Recent Labs Lab 07/03/17 1218  INR 1.08   CBC:  Recent Labs Lab 07/03/17 1218 07/04/17 0552  WBC 8.6 11.3*  NEUTROABS 5.9  --   HGB 13.9 13.4  HCT 40.5 38.7  MCV 95.5 94.4  PLT 169 159   Cardiac  Enzymes: No results for input(s): CKTOTAL, CKMB, CKMBINDEX, TROPONINI in the last 168 hours. BNP: Invalid input(s): POCBNP CBG: No results for input(s): GLUCAP in the last 168 hours. HbA1C: No results for input(s): HGBA1C in the last 72 hours. Urine analysis:    Component Value Date/Time   COLORURINE YELLOW 11/21/2015 1337   APPEARANCEUR CLEAR 11/21/2015 1337   LABSPEC 1.010 11/21/2015 1337   PHURINE 7.5 11/21/2015 1337   GLUCOSEU NEGATIVE 11/21/2015 1337   HGBUR NEGATIVE 11/21/2015 1337   BILIRUBINUR NEGATIVE 11/21/2015 1337   KETONESUR NEGATIVE 11/21/2015 1337   PROTEINUR NEGATIVE 11/21/2015 1337   UROBILINOGEN 0.2 11/21/2014 1341   NITRITE NEGATIVE 11/21/2015 1337   LEUKOCYTESUR NEGATIVE 11/21/2015 1337   Sepsis Labs: @LABRCNTIP (procalcitonin:4,lacticidven:4) ) Recent Results (from the past 240 hour(s))  Surgical PCR screen     Status: None   Collection Time: 07/03/17  6:00 PM  Result Value Ref Range Status   MRSA, PCR NEGATIVE NEGATIVE Final   Staphylococcus aureus NEGATIVE NEGATIVE Final    Comment:        The Xpert SA Assay (FDA approved for NASAL specimens in patients over 54 years of age), is one component of a comprehensive surveillance program.  Test performance has been validated by [facility] for patients greater than or equal to 45 year old. It is not intended to diagnose infection nor to guide or monitor treatment.      Scheduled Meds: . amLODipine  10 mg Oral Daily  . atorvastatin  10 mg Oral q1800  . calcium-vitamin D  1 tablet Oral Daily  . [START ON 07/07/2017] carbamide peroxide  5 drop Both EARS Weekly  . chlorhexidine  60 mL Topical Once  . docusate sodium  100 mg Oral Daily  . feeding supplement (ENSURE ENLIVE)  237 mL Oral BID BM  . losartan  50 mg Oral QPM  . mouth rinse  15 mL Mouth Rinse BID  . metoprolol succinate  25 mg Oral BID  . multivitamin with minerals  1 tablet Oral Daily  . omega-3 acid ethyl esters  1 g Oral Daily  .  povidone-iodine  2 application Topical Once   Continuous Infusions: . [START ON 07/05/2017]  ceFAZolin (ANCEF) IV    . heparin 700 Units/hr (07/03/17 2146)    Procedures/Studies: Dg Chest 1 View  Result Date: 07/03/2017 CLINICAL DATA:  Hip fracture EXAM: CHEST 1 VIEW COMPARISON:  11/21/2015 FINDINGS: COPD with hyperinflation. Extensive apical scarring bilaterally unchanged. Negative for heart failure or pneumonia. Negative for pleural effusion. IMPRESSION: COPD with apical scarring. No acute abnormality and unchanged from the prior study. Electronically Signed   By: Marlan Palau M.D.   On: 07/03/2017 12:01   Ct Head Wo Contrast  Result Date: 07/03/2017 CLINICAL DATA:  Fall at aerobics class. EXAM: CT HEAD WITHOUT CONTRAST TECHNIQUE: Contiguous axial images were obtained from the base of the skull through the vertex without intravenous contrast.  COMPARISON:  11/21/2015 FINDINGS: Brain: No evidence of acute infarction, hemorrhage, hydrocephalus, extra-axial collection or mass lesion/mass effect. Chronic right frontal lobe encephalomalacia identified There is mild diffuse low-attenuation within the subcortical and periventricular white matter compatible with chronic microvascular disease. Prominence of the sulci and ventricles noted. Vascular: No hyperdense vessel or unexpected calcification. Skull: Normal. Negative for fracture or focal lesion. Sinuses/Orbits: No acute finding. Other: None. IMPRESSION: 1. No acute intracranial abnormality. 2. Chronic small vessel ischemic disease and brain atrophy. 3. Chronic right frontal lobe infarct. Electronically Signed   By: Signa Kellaylor  Stroud M.D.   On: 07/03/2017 20:29   Dg Hip Unilat With Pelvis 2-3 Views Right  Result Date: 07/03/2017 CLINICAL DATA:  Fall.  Right hip pain EXAM: DG HIP (WITH OR WITHOUT PELVIS) 2-3V RIGHT COMPARISON:  None. FINDINGS: Right femoral neck fracture with angulation and impaction. Fracture is in the subcapital region of the femoral  neck. Calcification of the glenoid labrum. Joint space normal. No other pelvic fracture. Advanced disc degeneration L5-S1 IMPRESSION: Subcapital angulated fracture of the right femur. Electronically Signed   By: Marlan Palauharles  Clark M.D.   On: 07/03/2017 12:03    Quintus Premo, DO  [facility] Pager [phone]  If 7PM-7AM, please contact night-coverage [email or web address] Password [email or web address] 07/04/2017, 6:46 PM   LOS: 1 day

## 2017-07-04 NOTE — Consult Note (Signed)
Reason for Consult: RIGHT HIP FRACTURE   Referring Physician: DR D TAT  Theresa Kennedy is an 83 y.o. female.  HPI: 83 YO FEMALE MECHANICAL FALL AT [place] ON 8/15. RIGHT HIP PAIN RIGHT HIP FRACTURE  DULL RIGHT HIP PAIN NON RADIATING X 1 DAY  CAN BEAR WEIGHT   SHE IS ON ELOQUIS LAST DOSE 8/15   Past Medical History:  Diagnosis Date  . Atrial fibrillation ([place])   . GERD (gastroesophageal reflux disease)   . Hypertension   . Stroke [facility])     Past Surgical History:  Procedure Laterality Date  . ABDOMINAL HYSTERECTOMY      Family History  Problem Relation Age of Onset  . Stroke Mother   . Leukemia Brother   . Hypertension Father   . Breast cancer Sister     Social History:  reports that she has never smoked. She has never used smokeless tobacco. She reports that she does not drink alcohol or use drugs.  Allergies:  Allergies  Allergen Reactions  . Sulfa Antibiotics Other (See Comments)    unknown    Scheduled Meds: . amLODipine  10 mg Oral Daily  . atorvastatin  10 mg Oral q1800  . calcium-vitamin D  1 tablet Oral Daily  . [START ON 07/07/2017] carbamide peroxide  5 drop Both EARS Weekly  . docusate sodium  100 mg Oral Daily  . losartan  50 mg Oral QPM  . mouth rinse  15 mL Mouth Rinse BID  . metoprolol succinate  25 mg Oral BID  . multivitamin with minerals  1 tablet Oral Daily  . omega-3 acid ethyl esters  1 g Oral Daily   Continuous Infusions: . heparin 700 Units/hr (07/03/17 2146)   PRN Meds:.HYDROcodone-acetaminophen, morphine injection, ondansetron (ZOFRAN) IV, polyethylene glycol  Results for orders placed or performed during the hospital encounter of 07/03/17 (from the past 48 hour(s))  Basic metabolic panel     Status: Abnormal   Collection Time: 07/03/17 12:18 PM  Result Value Ref Range   Sodium 133 (L) 135 - 145 mmol/L   Potassium 3.8 3.5 - 5.1 mmol/L   Chloride 95 (L) 101 - 111 mmol/L   CO2 27 22 - 32 mmol/L   Glucose, Bld 164 (H) 65 - 99  mg/dL   BUN 21 (H) 6 - 20 mg/dL   Creatinine, Ser 0.86 0.44 - 1.00 mg/dL   Calcium 10.1 8.9 - 10.3 mg/dL   GFR calc non Af Amer 56 (L) >60 mL/min   GFR calc Af Amer >60 >60 mL/min    Comment: (NOTE) The eGFR has been calculated using the CKD EPI equation. This calculation has not been validated in all clinical situations. eGFR's persistently <60 mL/min signify possible Chronic Kidney Disease.    Anion gap 11 5 - 15  CBC WITH DIFFERENTIAL     Status: None   Collection Time: 07/03/17 12:18 PM  Result Value Ref Range   WBC 8.6 4.0 - 10.5 K/uL   RBC 4.24 3.87 - 5.11 MIL/uL   Hemoglobin 13.9 12.0 - 15.0 g/dL   HCT 40.5 36.0 - 46.0 %   MCV 95.5 78.0 - 100.0 fL   MCH 32.8 26.0 - 34.0 pg   MCHC 34.3 30.0 - 36.0 g/dL   RDW 13.2 11.5 - 15.5 %   Platelets 169 150 - 400 K/uL   Neutrophils Relative % 68 %   Neutro Abs 5.9 1.7 - 7.7 K/uL   Lymphocytes Relative 23 %  Lymphs Abs 2.0 0.7 - 4.0 K/uL   Monocytes Relative 7 %   Monocytes Absolute 0.6 0.1 - 1.0 K/uL   Eosinophils Relative 2 %   Eosinophils Absolute 0.2 0.0 - 0.7 K/uL   Basophils Relative 0 %   Basophils Absolute 0.0 0.0 - 0.1 K/uL  Protime-INR     Status: None   Collection Time: 07/03/17 12:18 PM  Result Value Ref Range   Prothrombin Time 14.0 11.4 - 15.2 seconds   INR 1.08   Type and screen [facility]     Status: None   Collection Time: 07/03/17 12:19 PM  Result Value Ref Range   ABO/RH(D) O POS    Antibody Screen NEG    Sample Expiration 07/06/2017   APTT     Status: None   Collection Time: 07/03/17  4:02 PM  Result Value Ref Range   aPTT 33 24 - 36 seconds  Heparin level (unfractionated)     Status: Abnormal   Collection Time: 07/03/17  4:02 PM  Result Value Ref Range   Heparin Unfractionated 1.34 (H) 0.30 - 0.70 IU/mL    Comment: RESULTS CONFIRMED BY MANUAL DILUTION        IF HEPARIN RESULTS ARE BELOW EXPECTED VALUES, AND PATIENT DOSAGE HAS BEEN CONFIRMED, SUGGEST FOLLOW UP TESTING OF ANTITHROMBIN  III LEVELS.   Surgical PCR screen     Status: None   Collection Time: 07/03/17  6:00 PM  Result Value Ref Range   MRSA, PCR NEGATIVE NEGATIVE   Staphylococcus aureus NEGATIVE NEGATIVE    Comment:        The Xpert SA Assay (FDA approved for NASAL specimens in patients over 43 years of age), is one component of a comprehensive surveillance program.  Test performance has been validated by [facility] for patients greater than or equal to 46 year old. It is not intended to diagnose infection nor to guide or monitor treatment.   CBC     Status: Abnormal   Collection Time: 07/04/17  5:52 AM  Result Value Ref Range   WBC 11.3 (H) 4.0 - 10.5 K/uL   RBC 4.10 3.87 - 5.11 MIL/uL   Hemoglobin 13.4 12.0 - 15.0 g/dL   HCT 38.7 36.0 - 46.0 %   MCV 94.4 78.0 - 100.0 fL   MCH 32.7 26.0 - 34.0 pg   MCHC 34.6 30.0 - 36.0 g/dL   RDW 13.0 11.5 - 15.5 %   Platelets 159 150 - 400 K/uL  Basic metabolic panel     Status: Abnormal   Collection Time: 07/04/17  5:52 AM  Result Value Ref Range   Sodium 132 (L) 135 - 145 mmol/L   Potassium 4.1 3.5 - 5.1 mmol/L   Chloride 94 (L) 101 - 111 mmol/L   CO2 30 22 - 32 mmol/L   Glucose, Bld 123 (H) 65 - 99 mg/dL   BUN 21 (H) 6 - 20 mg/dL   Creatinine, Ser 0.72 0.44 - 1.00 mg/dL   Calcium 9.3 8.9 - 10.3 mg/dL   GFR calc non Af Amer >60 >60 mL/min   GFR calc Af Amer >60 >60 mL/min    Comment: (NOTE) The eGFR has been calculated using the CKD EPI equation. This calculation has not been validated in all clinical situations. eGFR's persistently <60 mL/min signify possible Chronic Kidney Disease.    Anion gap 8 5 - 15    Dg Chest 1 View  Result Date: 07/03/2017 CLINICAL DATA:  Hip fracture EXAM: CHEST  1 VIEW COMPARISON:  11/21/2015 FINDINGS: COPD with hyperinflation. Extensive apical scarring bilaterally unchanged. Negative for heart failure or pneumonia. Negative for pleural effusion. IMPRESSION: COPD with apical scarring. No acute abnormality and  unchanged from the prior study. Electronically Signed   By: Charles  Clark M.D.   On: 07/03/2017 12:01   Ct Head Wo Contrast  Result Date: 07/03/2017 CLINICAL DATA:  Fall at aerobics class. EXAM: CT HEAD WITHOUT CONTRAST TECHNIQUE: Contiguous axial images were obtained from the base of the skull through the vertex without intravenous contrast. COMPARISON:  11/21/2015 FINDINGS: Brain: No evidence of acute infarction, hemorrhage, hydrocephalus, extra-axial collection or mass lesion/mass effect. Chronic right frontal lobe encephalomalacia identified There is mild diffuse low-attenuation within the subcortical and periventricular white matter compatible with chronic microvascular disease. Prominence of the sulci and ventricles noted. Vascular: No hyperdense vessel or unexpected calcification. Skull: Normal. Negative for fracture or focal lesion. Sinuses/Orbits: No acute finding. Other: None. IMPRESSION: 1. No acute intracranial abnormality. 2. Chronic small vessel ischemic disease and brain atrophy. 3. Chronic right frontal lobe infarct. Electronically Signed   By: Taylor  Stroud M.D.   On: 07/03/2017 20:29   Dg Hip Unilat With Pelvis 2-3 Views Right  Result Date: 07/03/2017 CLINICAL DATA:  Fall.  Right hip pain EXAM: DG HIP (WITH OR WITHOUT PELVIS) 2-3V RIGHT COMPARISON:  None. FINDINGS: Right femoral neck fracture with angulation and impaction. Fracture is in the subcapital region of the femoral neck. Calcification of the glenoid labrum. Joint space normal. No other pelvic fracture. Advanced disc degeneration L5-S1 IMPRESSION: Subcapital angulated fracture of the right femur. Electronically Signed   By: Charles  Clark M.D.   On: 07/03/2017 12:03    ROS Blood pressure (!) 147/69, pulse 60, temperature 97.6 F (36.4 C), resp. rate 18, height 5' 3" (1.6 m), weight 106 lb 12.8 oz (48.4 kg), SpO2 100 %. Physical Exam  Constitutional: She is oriented to person, place, and time. She appears well-nourished.   Eyes: Right eye exhibits no discharge. Left eye exhibits no discharge. No scleral icterus.  Neck: Neck supple. No JVD present. No tracheal deviation present.  Cardiovascular: Intact distal pulses.   Respiratory: Effort normal. No stridor.  GI: Soft. She exhibits no distension.  Neurological: She is alert and oriented to person, place, and time. She has normal reflexes. She exhibits normal muscle tone. Coordination normal.  Skin: Skin is warm and dry. No rash noted. No erythema. No pallor.  Psychiatric: She has a normal mood and affect. Her behavior is normal. Thought content normal.   Right and left upper extremity  Inspection normal alignment no tenderness, range of motion full, motor exam normal, laxity none, neurovascular exam intact lymph nodes negative  Left lower extremity inspection normal alignment, no tenderness, full range of motion, motor exam normal, laxity none, neurovascular exam is intact if note negative  Right lower extremity inspection external rotation shortened deformity tenderness proximal femur, range of motion not tested because of pain motor exam normal muscle tone, laxity none in the knee and ankle, neurovascular exam is intact lymph nodes negative.   Assessment/Plan: She is 83 years old she has a right hip fracture she does not use any assistive devices at home. She will need a bipolar replacement once her 48 hours off of eloquis  has been completed  I discussed the risks and benefits of surgery with her versus nonoperative treatment  She agrees to have a right hip partial replacement  Lucas Winograd 07/04/2017, 7:29 AM     

## 2017-07-04 NOTE — Progress Notes (Signed)
Patient ID: Theresa CooperBessie E Leven, female   DOB: 12/19/1920, 83 y.o.   MRN: [id number] BP (!) 143/66 (BP Location: Right Arm)   Pulse 84   Temp 97.6 F (36.4 C) (Oral)   Resp 18   Ht 5\' 3"  (1.6 m)   Wt 106 lb 12.8 oz (48.4 kg)   SpO2 99%   BMI 18.92 kg/m   CBC Latest Ref Rng & Units 07/04/2017 07/03/2017 11/20/2015  WBC 4.0 - 10.5 K/uL 11.3(H) 8.6 -  Hemoglobin 12.0 - 15.0 g/dL 21.313.4 08.613.9 57.814.3  Hematocrit 36.0 - 46.0 % 38.7 40.5 42.0  Platelets 150 - 400 K/uL 159 169 -    BMP Latest Ref Rng & Units 07/04/2017 07/03/2017 11/20/2015  Glucose 65 - 99 mg/dL 469(G123(H) 295(M164(H) 841(L114(H)  BUN 6 - 20 mg/dL 24(M21(H) 01(U21(H) 27(O21(H)  Creatinine 0.44 - 1.00 mg/dL 5.360.72 6.440.86 0.340.80  Sodium 135 - 145 mmol/L 132(L) 133(L) 133(L)  Potassium 3.5 - 5.1 mmol/L 4.1 3.8 4.2  Chloride 101 - 111 mmol/L 94(L) 95(L) 95(L)  CO2 22 - 32 mmol/L 30 27 -  Calcium 8.9 - 10.3 mg/dL 9.3 74.210.1 -    Current Facility-Administered Medications:  .  amLODipine (NORVASC) tablet 10 mg, 10 mg, Oral, Daily, Tat, David, MD, 10 mg at 07/04/17 [id number] .  atorvastatin (LIPITOR) tablet 10 mg, 10 mg, Oral, q1800, Tat, Onalee Huaavid, MD, 10 mg at 07/04/17 1702 .  calcium-vitamin D (OSCAL WITH D) 500-200 MG-UNIT per tablet 1 tablet, 1 tablet, Oral, Daily, Tat, David, MD, 1 tablet at 07/04/17 [phone] .  [START ON 07/07/2017] carbamide peroxide (DEBROX) 6.5 % OTIC (EAR) solution 5 drop, 5 drop, Both EARS, Weekly, Tat, David, MD .  Melene Muller[START ON 07/05/2017] ceFAZolin (ANCEF) IVPB 2g/100 mL premix, 2 g, Intravenous, On Call to OR, Vickki HearingHarrison, Stanley E, MD .  chlorhexidine (HIBICLENS) 4 % liquid 4 application, 60 mL, Topical, Once, Vickki HearingHarrison, Stanley E, MD .  docusate sodium (COLACE) capsule 100 mg, 100 mg, Oral, Daily, Tat, David, MD, 100 mg at 07/04/17 0811 .  feeding supplement (ENSURE ENLIVE) (ENSURE ENLIVE) liquid 237 mL, 237 mL, Oral, BID BM, Tat, David, MD, 237 mL at 07/04/17 1400 .  heparin ADULT infusion 100 units/mL (25000 units/26450mL sodium chloride 0.45%), 700 Units/hr,  Intravenous, Continuous, Tat, David, MD, Last Rate: 7 mL/hr at 07/03/17 2146, 700 Units/hr at 07/03/17 2146 .  HYDROcodone-acetaminophen (NORCO/VICODIN) 5-325 MG per tablet 1-2 tablet, 1-2 tablet, Oral, Q6H PRN, Catarina Hartshornat, David, MD, 1 tablet at 07/03/17 1715 .  losartan (COZAAR) tablet 50 mg, 50 mg, Oral, QPM, Tat, David, MD, 50 mg at 07/04/17 1702 .  MEDLINE mouth rinse, 15 mL, Mouth Rinse, BID, Tat, David, MD, 15 mL at 07/04/17 1000 .  metoprolol succinate (TOPROL-XL) 24 hr tablet 25 mg, 25 mg, Oral, BID, Tat, David, MD, 25 mg at 07/04/17 [phone] .  morphine 2 MG/ML injection 0.5 mg, 0.5 mg, Intravenous, Q2H PRN, Tat, David, MD, 0.5 mg at 07/04/17 1702 .  multivitamin with minerals tablet 1 tablet, 1 tablet, Oral, Daily, Tat, David, MD, 1 tablet at 07/04/17 [phone] .  omega-3 acid ethyl esters (LOVAZA) capsule 1 g, 1 g, Oral, Daily, Tat, David, MD, 1 g at 07/04/17 0811 .  ondansetron (ZOFRAN) injection 4 mg, 4 mg, Intravenous, Q6H PRN, Tat, David, MD, 4 mg at 07/04/17 1035 .  polyethylene glycol (MIRALAX / GLYCOLAX) packet 17 g, 17 g, Oral, Daily PRN, Tat, David, MD .  povidone-iodine 10 % swab 2 application, 2 application, Topical, Once, Fuller CanadaHarrison, Stanley  E, MD

## 2017-07-04 NOTE — Care Management Note (Signed)
Case Management Note  Patient Details  Name: Carleene CooperBessie E Soliman MRN: [id number] Date of Birth: 10/29/1921  Subjective/Objective: Adm with right femur fracture. Surgery planned. Holding Eliquis.                    Action/Plan: Anticipate patient will need SNF. CM following for needs.   Expected Discharge Date:     unk            Expected Discharge Plan:  Skilled Nursing Facility  In-House Referral:  Clinical Social Work  Discharge planning Services  CM Consult  Post Acute Care Choice:    Choice offered to:     DME Arranged:    DME Agency:     HH Arranged:    HH Agency:     Status of Service:  In process, will continue to follow  If discussed at Long Length of Stay Meetings, dates discussed:    Additional Comments:  Teosha Casso, Chrystine OilerSharley Diane, RN 07/04/2017, 1:11 PM

## 2017-07-04 NOTE — Progress Notes (Signed)
ANTICOAGULATION CONSULT NOTE -   Pharmacy Consult for Heparin Indication: atrial fibrillation  Allergies  Allergen Reactions  . Sulfa Antibiotics Other (See Comments)    unknown    Patient Measurements: Height: 5\' 3"  (160 cm) Weight: 106 lb 12.8 oz (48.4 kg) IBW/kg (Calculated) : 52.4 HEPARIN DW (KG): 48.4  Vital Signs: Temp: 97.6 F (36.4 C) (08/16 0500) BP: 147/69 (08/16 0500) Pulse Rate: 60 (08/16 0500)  Labs:  Recent Labs  07/03/17 1218 07/03/17 1602 07/04/17 0552  HGB 13.9  --  13.4  HCT 40.5  --  38.7  PLT 169  --  159  APTT  --  33 95*  LABPROT 14.0  --   --   INR 1.08  --   --   HEPARINUNFRC  --  1.34* 0.96*  CREATININE 0.86  --  0.72    Estimated Creatinine Clearance: 33.6 mL/min (by C-G formula based on SCr of 0.72 mg/dL).   Medical History: Past Medical History:  Diagnosis Date  . Atrial fibrillation (HCC)   . GERD (gastroesophageal reflux disease)   . Hypertension   . Stroke [facility](HCC)     Medications:  See med rec Assessment: 83 y.o. female with medical history of stroke, essential hypertension, hyperlipidemia, and atrial fibrillation presented with a mechanical fall onto her right side while at an exercise dance class on the morning of 07/03/2017. Xray show subcapital femur fracture. Patient is on chronic anticoagulation with apixaban and last dose was this am at 0800. Will start heparin at 2000. Plan is for surgery 8/17 Friday. Heparin is to be stopped at midnight of 8/16 Thursday. APTT is therapeutic this AM. (HL not correlating with APTT).  Goal of Therapy:  Heparin level 0.3-0.7 units/ml aPTT 66-102 seconds Monitor platelets by anticoagulation protocol: Yes   Plan:  Continue heparin infusion at 700 units/hr Stop heparin 8/16 Thursday at midnight F/U plans post surgery for anticoagulation plans Continue to monitor H&H and platelets  Elder CyphersLorie Ronique Simerly, BS Loura BackPharm D, BCPS Clinical Pharmacist Pager [phone] 07/04/2017,10:20 AM

## 2017-07-04 NOTE — Progress Notes (Signed)
Initial Nutrition Assessment   INTERVENTION:  Ensure Enlive po BID, each supplement provides 350 kcal and 20 grams of protein   MVI, Omega- 3, Calcium-Vitamin D     NUTRITION DIAGNOSIS:   Inadequate oral intake related to poor appetite as evidenced by per patient/family report.   GOAL:   Patient will meet greater than or equal to 90% of their needs   MONITOR:   Diet advancement, PO intake, Supplement acceptance, Skin, Labs, Weight trends  REASON FOR ASSESSMENT:   Consult Hip fracture protocol  ASSESSMENT:   Ms Mccowen's home diet is regular. She is able to feed herself. Denies chewing or swallowing problems but says her appetite has not been as good this summer which she associates with  the hot weather. At lunch today she had 25-50% of her meals green beans, mashed potatoes, chicken and fruit cocktail. She drank all of her tea and had a gingerale. At home she drinks primarily water. She is agreeable to drink Ensure to help support her intake given her increased needs with pending surgery.  Her weight is down slightly from usual 110# likely related to diminished appetite.  Limited Nutrition-Focused physical exam completed. Findings are mild orbital fat depletion, moderate clavicle muscle depletion.    Recent Labs Lab 07/03/17 1218 07/04/17 0552  NA 133* 132*  K 3.8 4.1  CL 95* 94*  CO2 27 30  BUN 21* 21*  CREATININE 0.86 0.72  CALCIUM 10.1 9.3  GLUCOSE 164* 123*   Labs: sodium 132, Glu 123   Meds: lipitor, MVI, Calcium-D and Omega-3.   Diet Order:  Diet NPO time specified Diet Heart Room service appropriate? Yes; Fluid consistency: Thin  Skin:  Reviewed, no issues  Last BM:  8/15   Height:   Ht Readings from Last 1 Encounters:  07/03/17 5\' 3"  (1.6 m)    Weight:   Wt Readings from Last 1 Encounters:  07/03/17 106 lb 12.8 oz (48.4 kg)    Ideal Body Weight:  52 kg  BMI:  Body mass index is 18.92 kg/m.  Estimated Nutritional Needs:   Kcal:   1584-1680 (33-35 kcal/kg)  Protein:  60-65 gr (1.2-1.3 gr/kg)  Fluid:  >1200 ml daily  EDUCATION NEEDS:   Education needs addressed- related to increased nutrient needs related to healing and recovery following hip surgery  Royann ShiversLynn Bray Vickerman MS,RD,CSG,LDN Office: [phone] Pager: [phone]

## 2017-07-05 ENCOUNTER — Encounter (HOSPITAL_COMMUNITY)
Admission: EM | Disposition: A | Discharge status: Skilled Nursing Facility | Payer: Self-pay | Source: Home / Self Care | Attending: Internal Medicine

## 2017-07-05 ENCOUNTER — Encounter (HOSPITAL_COMMUNITY): Payer: Self-pay | Admitting: *Deleted

## 2017-07-05 ENCOUNTER — Inpatient Hospital Stay (HOSPITAL_COMMUNITY): Payer: Medicare HMO | Admitting: Anesthesiology

## 2017-07-05 ENCOUNTER — Inpatient Hospital Stay (HOSPITAL_COMMUNITY): Payer: Medicare HMO

## 2017-07-05 HISTORY — PX: HIP ARTHROPLASTY: SHX981

## 2017-07-05 LAB — CBC
HCT: 44.1 % (ref 36.0–46.0)
HEMOGLOBIN: 15 g/dL (ref 12.0–15.0)
MCH: 32.5 pg (ref 26.0–34.0)
MCHC: 34 g/dL (ref 30.0–36.0)
MCV: 95.5 fL (ref 78.0–100.0)
PLATELETS: 167 10*3/uL (ref 150–400)
RBC: 4.62 MIL/uL (ref 3.87–5.11)
RDW: 13.3 % (ref 11.5–15.5)
WBC: 10.9 10*3/uL — ABNORMAL HIGH (ref 4.0–10.5)

## 2017-07-05 SURGERY — HEMIARTHROPLASTY, HIP, DIRECT ANTERIOR APPROACH, FOR FRACTURE
Anesthesia: Spinal | Site: Hip | Laterality: Right

## 2017-07-05 MED ORDER — ACETAMINOPHEN 650 MG RE SUPP: 650.0000 mg | Freq: Four times a day (QID) | RECTAL | Status: DC | PRN

## 2017-07-05 MED ORDER — FENTANYL CITRATE (PF) 100 MCG/2ML IJ SOLN
INTRAMUSCULAR | Status: AC
  Filled 2017-07-05: qty 2

## 2017-07-05 MED ORDER — MIDAZOLAM HCL 2 MG/2ML IJ SOLN
INTRAMUSCULAR | Status: AC
  Filled 2017-07-05: qty 2

## 2017-07-05 MED ORDER — ACETAMINOPHEN 325 MG PO TABS: 650.0000 mg | ORAL_TABLET | Freq: Four times a day (QID) | ORAL | Status: DC | PRN

## 2017-07-05 MED ORDER — METOCLOPRAMIDE HCL 5 MG/ML IJ SOLN: 5.0000 mg | Freq: Three times a day (TID) | INTRAMUSCULAR | Status: DC | PRN

## 2017-07-05 MED ORDER — LACTATED RINGERS IV SOLN
INTRAVENOUS | Status: DC
  Administered 2017-07-05 (×2): via INTRAVENOUS

## 2017-07-05 MED ORDER — PROPOFOL 500 MG/50ML IV EMUL
INTRAVENOUS | Status: DC | PRN
  Administered 2017-07-05: 50 ug/kg/min via INTRAVENOUS

## 2017-07-05 MED ORDER — MIDAZOLAM HCL 2 MG/2ML IJ SOLN
1.0000 mg | INTRAMUSCULAR | Status: DC
Start: 2017-07-05 — End: 2017-07-05
  Administered 2017-07-05: 2 mg via INTRAVENOUS

## 2017-07-05 MED ORDER — SODIUM CHLORIDE 0.9 % IV SOLN
INTRAVENOUS | Status: DC
  Administered 2017-07-05 (×2): via INTRAVENOUS

## 2017-07-05 MED ORDER — SODIUM CHLORIDE 0.9 % IJ SOLN
INTRAMUSCULAR | Status: AC
  Filled 2017-07-05: qty 10

## 2017-07-05 MED ORDER — FENTANYL CITRATE (PF) 100 MCG/2ML IJ SOLN
25.0000 ug | Freq: Once | INTRAMUSCULAR | Status: AC
  Administered 2017-07-05: 25 ug via INTRAVENOUS

## 2017-07-05 MED ORDER — PHENYLEPHRINE 40 MCG/ML (10ML) SYRINGE FOR IV PUSH (FOR BLOOD PRESSURE SUPPORT)
PREFILLED_SYRINGE | INTRAVENOUS | Status: AC
  Filled 2017-07-05: qty 10

## 2017-07-05 MED ORDER — FENTANYL CITRATE (PF) 100 MCG/2ML IJ SOLN
INTRAMUSCULAR | Status: DC | PRN
  Administered 2017-07-05: 25 ug via INTRATHECAL
  Administered 2017-07-05: 25 ug via INTRAVENOUS

## 2017-07-05 MED ORDER — SODIUM CHLORIDE 0.9% FLUSH
INTRAVENOUS | Status: AC
  Filled 2017-07-05: qty 10

## 2017-07-05 MED ORDER — BUPIVACAINE-EPINEPHRINE (PF) 0.5% -1:200000 IJ SOLN
INTRAMUSCULAR | Status: AC
  Filled 2017-07-05: qty 60

## 2017-07-05 MED ORDER — SODIUM CHLORIDE 0.9 % IR SOLN
Status: DC | PRN
  Administered 2017-07-05: 3000 mL

## 2017-07-05 MED ORDER — PHENOL 1.4 % MT LIQD
1.0000 | OROMUCOSAL | Status: DC | PRN
  Filled 2017-07-05: qty 177

## 2017-07-05 MED ORDER — SUCCINYLCHOLINE CHLORIDE 20 MG/ML IJ SOLN
INTRAMUSCULAR | Status: AC
  Filled 2017-07-05: qty 1

## 2017-07-05 MED ORDER — EPHEDRINE SULFATE 50 MG/ML IJ SOLN
INTRAMUSCULAR | Status: AC
  Filled 2017-07-05: qty 1

## 2017-07-05 MED ORDER — METOCLOPRAMIDE HCL 10 MG PO TABS: 5.0000 mg | ORAL_TABLET | Freq: Three times a day (TID) | ORAL | Status: DC | PRN

## 2017-07-05 MED ORDER — MENTHOL 3 MG MT LOZG
1.0000 | LOZENGE | OROMUCOSAL | Status: DC | PRN
  Filled 2017-07-05: qty 9

## 2017-07-05 MED ORDER — METOPROLOL TARTRATE 25 MG PO TABS
25.0000 mg | ORAL_TABLET | Freq: Two times a day (BID) | ORAL | Status: DC
  Administered 2017-07-05 – 2017-07-07 (×5): 25 mg via ORAL
  Filled 2017-07-05 (×5): qty 1

## 2017-07-05 MED ORDER — EPHEDRINE SULFATE 50 MG/ML IJ SOLN
INTRAMUSCULAR | Status: DC | PRN
  Administered 2017-07-05: 5 mg via INTRAVENOUS
  Administered 2017-07-05 (×3): 10 mg via INTRAVENOUS
  Administered 2017-07-05 (×3): 5 mg via INTRAVENOUS

## 2017-07-05 MED ORDER — APIXABAN 2.5 MG PO TABS
2.5000 mg | ORAL_TABLET | Freq: Two times a day (BID) | ORAL | Status: DC
  Administered 2017-07-07 (×2): 2.5 mg via ORAL
  Filled 2017-07-05 (×2): qty 1

## 2017-07-05 MED ORDER — PHENYLEPHRINE HCL 10 MG/ML IJ SOLN
INTRAMUSCULAR | Status: DC | PRN
  Administered 2017-07-05 (×7): 80 ug via INTRAVENOUS
  Administered 2017-07-05: 40 ug via INTRAVENOUS
  Administered 2017-07-05 (×2): 80 ug via INTRAVENOUS

## 2017-07-05 MED ORDER — PROPOFOL 10 MG/ML IV BOLUS
INTRAVENOUS | Status: AC
  Filled 2017-07-05: qty 40

## 2017-07-05 MED ORDER — ONDANSETRON HCL 4 MG/2ML IJ SOLN
INTRAMUSCULAR | Status: AC
  Filled 2017-07-05: qty 2

## 2017-07-05 MED ORDER — BUPIVACAINE-EPINEPHRINE (PF) 0.5% -1:200000 IJ SOLN
INTRAMUSCULAR | Status: DC | PRN
  Administered 2017-07-05: 60 mL via PERINEURAL

## 2017-07-05 MED ORDER — BUPIVACAINE IN DEXTROSE 0.75-8.25 % IT SOLN
INTRATHECAL | Status: AC
  Filled 2017-07-05: qty 2

## 2017-07-05 MED ORDER — METOPROLOL TARTRATE 25 MG PO TABS: 25.0000 mg | ORAL_TABLET | Freq: Two times a day (BID) | ORAL | Status: DC

## 2017-07-05 MED ORDER — BUPIVACAINE IN DEXTROSE 0.75-8.25 % IT SOLN
INTRATHECAL | Status: DC | PRN
  Administered 2017-07-05: 13 mg via INTRATHECAL

## 2017-07-05 MED ORDER — 0.9 % SODIUM CHLORIDE (POUR BTL) OPTIME
TOPICAL | Status: DC | PRN
  Administered 2017-07-05: 1000 mL

## 2017-07-05 MED ORDER — ONDANSETRON HCL 4 MG/2ML IJ SOLN
4.0000 mg | Freq: Once | INTRAMUSCULAR | Status: AC
  Administered 2017-07-05: 4 mg via INTRAVENOUS

## 2017-07-05 MED ORDER — CEFAZOLIN SODIUM-DEXTROSE 2-4 GM/100ML-% IV SOLN
2.0000 g | Freq: Four times a day (QID) | INTRAVENOUS | Status: AC
  Administered 2017-07-05 (×2): 2 g via INTRAVENOUS
  Filled 2017-07-05 (×4): qty 100

## 2017-07-05 SURGICAL SUPPLY — 59 items
BAG HAMPER (MISCELLANEOUS) ×3 IMPLANT
BIT DRILL 2.8X128 (BIT) ×2 IMPLANT
BIT DRILL 2.8X128MM (BIT) ×1
BLADE 10 SAFETY STRL DISP (BLADE) ×3 IMPLANT
BLADE HEX COATED 2.75 (ELECTRODE) ×3 IMPLANT
BLADE SAGITTAL 25.0X1.27X90 (BLADE) ×2 IMPLANT
BLADE SAGITTAL 25.0X1.27X90MM (BLADE) ×1
BRUSH FEMORAL CANAL (MISCELLANEOUS) IMPLANT
CAPT HIP HEMI 1 ×2 IMPLANT
CHLORAPREP W/TINT 26ML (MISCELLANEOUS) ×3 IMPLANT
CLOTH BEACON ORANGE TIMEOUT ST (SAFETY) ×3 IMPLANT
COVER LIGHT HANDLE STERIS (MISCELLANEOUS) ×6 IMPLANT
COVER PROBE W GEL 5X96 (DRAPES) ×3 IMPLANT
DECANTER SPIKE VIAL GLASS SM (MISCELLANEOUS) ×6 IMPLANT
DRAPE HIP W/POCKET STRL (DRAPE) ×3 IMPLANT
DRAPE U-SHAPE 47X51 STRL (DRAPES) ×3 IMPLANT
DRSG MEPILEX BORDER 4X12 (GAUZE/BANDAGES/DRESSINGS) ×3 IMPLANT
ELECT REM PT RETURN 9FT ADLT (ELECTROSURGICAL) ×3
ELECTRODE REM PT RTRN 9FT ADLT (ELECTROSURGICAL) ×1 IMPLANT
EVACUATOR 3/16  PVC DRAIN (DRAIN)
EVACUATOR 3/16 PVC DRAIN (DRAIN) IMPLANT
FACESHIELD LNG OPTICON STERILE (SAFETY) ×3 IMPLANT
GLOVE BIOGEL PI IND STRL 7.0 (GLOVE) ×1 IMPLANT
GLOVE BIOGEL PI INDICATOR 7.0 (GLOVE) ×2
GLOVE SKINSENSE NS SZ8.0 LF (GLOVE) ×4
GLOVE SKINSENSE STRL SZ8.0 LF (GLOVE) ×2 IMPLANT
GLOVE SS N UNI LF 8.5 STRL (GLOVE) ×3 IMPLANT
GOWN STRL REUS W/TWL LRG LVL3 (GOWN DISPOSABLE) ×6 IMPLANT
GOWN STRL REUS W/TWL XL LVL3 (GOWN DISPOSABLE) ×3 IMPLANT
HANDPIECE INTERPULSE COAX TIP (DISPOSABLE) ×3
INST SET MAJOR BONE (KITS) ×3 IMPLANT
IV NS IRRIG 3000ML ARTHROMATIC (IV SOLUTION) ×2 IMPLANT
KIT BLADEGUARD II DBL (SET/KITS/TRAYS/PACK) ×3 IMPLANT
KIT ROOM TURNOVER APOR (KITS) ×3 IMPLANT
MANIFOLD NEPTUNE II (INSTRUMENTS) ×3 IMPLANT
MARKER SKIN DUAL TIP RULER LAB (MISCELLANEOUS) ×3 IMPLANT
NEEDLE HYPO 21X1.5 SAFETY (NEEDLE) ×3 IMPLANT
NS IRRIG 1000ML POUR BTL (IV SOLUTION) ×3 IMPLANT
PACK TOTAL JOINT (CUSTOM PROCEDURE TRAY) ×3 IMPLANT
PAD ARMBOARD 7.5X6 YLW CONV (MISCELLANEOUS) ×3 IMPLANT
PASSER SUT SWANSON 36MM LOOP (INSTRUMENTS) IMPLANT
PILLOW HIP ABDUCTION SM (ORTHOPEDIC SUPPLIES) ×2 IMPLANT
PIN STMN SNGL STERILE 9X3.6MM (PIN) ×6 IMPLANT
SET BASIN LINEN APH (SET/KITS/TRAYS/PACK) ×3 IMPLANT
SET HNDPC FAN SPRY TIP SCT (DISPOSABLE) ×1 IMPLANT
STAPLER VISISTAT 35W (STAPLE) ×3 IMPLANT
SUT BRALON NAB BRD #1 30IN (SUTURE) ×6 IMPLANT
SUT ETHIBOND 5 LR DA (SUTURE) ×6 IMPLANT
SUT MNCRL 0 VIOLET CTX 36 (SUTURE) ×1 IMPLANT
SUT MON AB 2-0 CT1 36 (SUTURE) ×3 IMPLANT
SUT MONOCRYL 0 CTX 36 (SUTURE) ×2
SUT VIC AB 1 CT1 27 (SUTURE)
SUT VIC AB 1 CT1 27XBRD ANTBC (SUTURE) IMPLANT
SYR 30ML LL (SYRINGE) ×3 IMPLANT
SYR BULB IRRIGATION 50ML (SYRINGE) ×3 IMPLANT
TOWER CARTRIDGE SMART MIX (DISPOSABLE) IMPLANT
TRAY FOLEY CATH SILVER 16FR (SET/KITS/TRAYS/PACK) ×2 IMPLANT
WATER STERILE IRR 1000ML POUR (IV SOLUTION) ×6 IMPLANT
YANKAUER SUCT 12FT TUBE ARGYLE (SUCTIONS) ×3 IMPLANT

## 2017-07-05 NOTE — Anesthesia Procedure Notes (Signed)
Procedure Name: MAC Date/Time: 07/05/2017 7:29 AM Performed by: Andree Elk, AMY A Pre-anesthesia Checklist: Patient identified, Emergency Drugs available, Suction available, Patient being monitored and Timeout performed Patient Re-evaluated:Patient Re-evaluated prior to induction Oxygen Delivery Method: Simple face mask Laryngoscope Size: Miller and 2 Grade View: Grade I Tube type: Oral Tube size: 7.0 mm Number of attempts: 1 Airway Equipment and Method: Stylet Placement Confirmation: ETT inserted through vocal cords under direct vision,  positive ETCO2 and breath sounds checked- equal and bilateral Secured at: 21 cm Tube secured with: Tape Dental Injury: Teeth and Oropharynx as per pre-operative assessment

## 2017-07-05 NOTE — Pre-Procedure Instructions (Signed)
Chart review.

## 2017-07-05 NOTE — Anesthesia Preprocedure Evaluation (Signed)
Anesthesia Evaluation  Patient identified by MRN, date of birth, ID band Patient awake    Reviewed: Allergy & Precautions, NPO status , Patient's Chart, lab work & pertinent test results, reviewed documented beta blocker date and time   Airway Mallampati: II  TM Distance: >3 FB Neck ROM: Full    Dental  (+) Teeth Intact   Pulmonary neg pulmonary ROS,    breath sounds clear to auscultation       Cardiovascular hypertension, Pt. on medications and Pt. on home beta blockers + Peripheral Vascular Disease  + dysrhythmias Atrial Fibrillation  Rhythm:Irregular Rate:Normal     Neuro/Psych TIACVA (2014)    GI/Hepatic Neg liver ROS, GERD  Medicated and Controlled,  Endo/Other  negative endocrine ROS  Renal/GU negative Renal ROS     Musculoskeletal   Abdominal   Peds  Hematology negative hematology ROS (+)   Anesthesia Other Findings   Reproductive/Obstetrics                             Anesthesia Physical Anesthesia Plan  ASA: III  Anesthesia Plan: Spinal   Post-op Pain Management:    Induction:   PONV Risk Score and Plan:   Airway Management Planned: Simple Face Mask  Additional Equipment:   Intra-op Plan:   Post-operative Plan:   Informed Consent: I have reviewed the patients History and Physical, chart, labs and discussed the procedure including the risks, benefits and alternatives for the proposed anesthesia with the patient or authorized representative who has indicated his/her understanding and acceptance.     Plan Discussed with:   Anesthesia Plan Comments:         Anesthesia Quick Evaluation

## 2017-07-05 NOTE — Plan of Care (Signed)
Problem: Pain Managment: Goal: General experience of comfort will improve Outcome: Not Progressing Pt c/o constant R hip pain, given pain medication x2 so far this shift. Pt educated on pain mgmt, medications available and times. Pt verbalized understanding. Will continue to monitor pt

## 2017-07-05 NOTE — Anesthesia Procedure Notes (Signed)
Spinal  Patient location during procedure: OR Start time: 07/05/2017 7:51 AM Staffing Resident/CRNA: ADAMS, AMY A Preanesthetic Checklist Completed: patient identified, site marked, surgical consent, pre-op evaluation, timeout performed, IV checked, risks and benefits discussed and monitors and equipment checked Spinal Block Patient position: right lateral decubitus Prep: Betadine Patient monitoring: heart rate, cardiac monitor, continuous pulse ox and blood pressure Approach: right paramedian Location: L3-4 Injection technique: single-shot Needle Needle type: Spinocan  Needle gauge: 22 G Needle length: 9 cm Assessment Sensory level: T8 Additional Notes ATTEMPTS:2 TRAY [id number] TRAY EXPIRATION DATE:08/19/2019

## 2017-07-05 NOTE — H&P (View-Only) (Signed)
Reason for Consult: RIGHT HIP FRACTURE   Referring Physician: DR D TAT  Theresa Kennedy is an 83 y.o. female.  HPI: 83 YO FEMALE MECHANICAL FALL AT [place] ON 8/15. RIGHT HIP PAIN RIGHT HIP FRACTURE  DULL RIGHT HIP PAIN NON RADIATING X 1 DAY  CAN BEAR WEIGHT   SHE IS ON ELOQUIS LAST DOSE 8/15   Past Medical History:  Diagnosis Date  . Atrial fibrillation ([place])   . GERD (gastroesophageal reflux disease)   . Hypertension   . Stroke [facility])     Past Surgical History:  Procedure Laterality Date  . ABDOMINAL HYSTERECTOMY      Family History  Problem Relation Age of Onset  . Stroke Mother   . Leukemia Brother   . Hypertension Father   . Breast cancer Sister     Social History:  reports that she has never smoked. She has never used smokeless tobacco. She reports that she does not drink alcohol or use drugs.  Allergies:  Allergies  Allergen Reactions  . Sulfa Antibiotics Other (See Comments)    unknown    Scheduled Meds: . amLODipine  10 mg Oral Daily  . atorvastatin  10 mg Oral q1800  . calcium-vitamin D  1 tablet Oral Daily  . [START ON 07/07/2017] carbamide peroxide  5 drop Both EARS Weekly  . docusate sodium  100 mg Oral Daily  . losartan  50 mg Oral QPM  . mouth rinse  15 mL Mouth Rinse BID  . metoprolol succinate  25 mg Oral BID  . multivitamin with minerals  1 tablet Oral Daily  . omega-3 acid ethyl esters  1 g Oral Daily   Continuous Infusions: . heparin 700 Units/hr (07/03/17 2146)   PRN Meds:.HYDROcodone-acetaminophen, morphine injection, ondansetron (ZOFRAN) IV, polyethylene glycol  Results for orders placed or performed during the hospital encounter of 07/03/17 (from the past 48 hour(s))  Basic metabolic panel     Status: Abnormal   Collection Time: 07/03/17 12:18 PM  Result Value Ref Range   Sodium 133 (L) 135 - 145 mmol/L   Potassium 3.8 3.5 - 5.1 mmol/L   Chloride 95 (L) 101 - 111 mmol/L   CO2 27 22 - 32 mmol/L   Glucose, Bld 164 (H) 65 - 99  mg/dL   BUN 21 (H) 6 - 20 mg/dL   Creatinine, Ser 0.86 0.44 - 1.00 mg/dL   Calcium 10.1 8.9 - 10.3 mg/dL   GFR calc non Af Amer 56 (L) >60 mL/min   GFR calc Af Amer >60 >60 mL/min    Comment: (NOTE) The eGFR has been calculated using the CKD EPI equation. This calculation has not been validated in all clinical situations. eGFR's persistently <60 mL/min signify possible Chronic Kidney Disease.    Anion gap 11 5 - 15  CBC WITH DIFFERENTIAL     Status: None   Collection Time: 07/03/17 12:18 PM  Result Value Ref Range   WBC 8.6 4.0 - 10.5 K/uL   RBC 4.24 3.87 - 5.11 MIL/uL   Hemoglobin 13.9 12.0 - 15.0 g/dL   HCT 40.5 36.0 - 46.0 %   MCV 95.5 78.0 - 100.0 fL   MCH 32.8 26.0 - 34.0 pg   MCHC 34.3 30.0 - 36.0 g/dL   RDW 13.2 11.5 - 15.5 %   Platelets 169 150 - 400 K/uL   Neutrophils Relative % 68 %   Neutro Abs 5.9 1.7 - 7.7 K/uL   Lymphocytes Relative 23 %  Lymphs Abs 2.0 0.7 - 4.0 K/uL   Monocytes Relative 7 %   Monocytes Absolute 0.6 0.1 - 1.0 K/uL   Eosinophils Relative 2 %   Eosinophils Absolute 0.2 0.0 - 0.7 K/uL   Basophils Relative 0 %   Basophils Absolute 0.0 0.0 - 0.1 K/uL  Protime-INR     Status: None   Collection Time: 07/03/17 12:18 PM  Result Value Ref Range   Prothrombin Time 14.0 11.4 - 15.2 seconds   INR 1.08   Type and screen [facility]     Status: None   Collection Time: 07/03/17 12:19 PM  Result Value Ref Range   ABO/RH(D) O POS    Antibody Screen NEG    Sample Expiration 07/06/2017   APTT     Status: None   Collection Time: 07/03/17  4:02 PM  Result Value Ref Range   aPTT 33 24 - 36 seconds  Heparin level (unfractionated)     Status: Abnormal   Collection Time: 07/03/17  4:02 PM  Result Value Ref Range   Heparin Unfractionated 1.34 (H) 0.30 - 0.70 IU/mL    Comment: RESULTS CONFIRMED BY MANUAL DILUTION        IF HEPARIN RESULTS ARE BELOW EXPECTED VALUES, AND PATIENT DOSAGE HAS BEEN CONFIRMED, SUGGEST FOLLOW UP TESTING OF ANTITHROMBIN  III LEVELS.   Surgical PCR screen     Status: None   Collection Time: 07/03/17  6:00 PM  Result Value Ref Range   MRSA, PCR NEGATIVE NEGATIVE   Staphylococcus aureus NEGATIVE NEGATIVE    Comment:        The Xpert SA Assay (FDA approved for NASAL specimens in patients over 43 years of age), is one component of a comprehensive surveillance program.  Test performance has been validated by [facility] for patients greater than or equal to 46 year old. It is not intended to diagnose infection nor to guide or monitor treatment.   CBC     Status: Abnormal   Collection Time: 07/04/17  5:52 AM  Result Value Ref Range   WBC 11.3 (H) 4.0 - 10.5 K/uL   RBC 4.10 3.87 - 5.11 MIL/uL   Hemoglobin 13.4 12.0 - 15.0 g/dL   HCT 38.7 36.0 - 46.0 %   MCV 94.4 78.0 - 100.0 fL   MCH 32.7 26.0 - 34.0 pg   MCHC 34.6 30.0 - 36.0 g/dL   RDW 13.0 11.5 - 15.5 %   Platelets 159 150 - 400 K/uL  Basic metabolic panel     Status: Abnormal   Collection Time: 07/04/17  5:52 AM  Result Value Ref Range   Sodium 132 (L) 135 - 145 mmol/L   Potassium 4.1 3.5 - 5.1 mmol/L   Chloride 94 (L) 101 - 111 mmol/L   CO2 30 22 - 32 mmol/L   Glucose, Bld 123 (H) 65 - 99 mg/dL   BUN 21 (H) 6 - 20 mg/dL   Creatinine, Ser 0.72 0.44 - 1.00 mg/dL   Calcium 9.3 8.9 - 10.3 mg/dL   GFR calc non Af Amer >60 >60 mL/min   GFR calc Af Amer >60 >60 mL/min    Comment: (NOTE) The eGFR has been calculated using the CKD EPI equation. This calculation has not been validated in all clinical situations. eGFR's persistently <60 mL/min signify possible Chronic Kidney Disease.    Anion gap 8 5 - 15    Dg Chest 1 View  Result Date: 07/03/2017 CLINICAL DATA:  Hip fracture EXAM: CHEST  1 VIEW COMPARISON:  11/21/2015 FINDINGS: COPD with hyperinflation. Extensive apical scarring bilaterally unchanged. Negative for heart failure or pneumonia. Negative for pleural effusion. IMPRESSION: COPD with apical scarring. No acute abnormality and  unchanged from the prior study. Electronically Signed   By: Franchot Gallo M.D.   On: 07/03/2017 12:01   Ct Head Wo Contrast  Result Date: 07/03/2017 CLINICAL DATA:  Fall at aerobics class. EXAM: CT HEAD WITHOUT CONTRAST TECHNIQUE: Contiguous axial images were obtained from the base of the skull through the vertex without intravenous contrast. COMPARISON:  11/21/2015 FINDINGS: Brain: No evidence of acute infarction, hemorrhage, hydrocephalus, extra-axial collection or mass lesion/mass effect. Chronic right frontal lobe encephalomalacia identified There is mild diffuse low-attenuation within the subcortical and periventricular white matter compatible with chronic microvascular disease. Prominence of the sulci and ventricles noted. Vascular: No hyperdense vessel or unexpected calcification. Skull: Normal. Negative for fracture or focal lesion. Sinuses/Orbits: No acute finding. Other: None. IMPRESSION: 1. No acute intracranial abnormality. 2. Chronic small vessel ischemic disease and brain atrophy. 3. Chronic right frontal lobe infarct. Electronically Signed   By: Kerby Moors M.D.   On: 07/03/2017 20:29   Dg Hip Unilat With Pelvis 2-3 Views Right  Result Date: 07/03/2017 CLINICAL DATA:  Fall.  Right hip pain EXAM: DG HIP (WITH OR WITHOUT PELVIS) 2-3V RIGHT COMPARISON:  None. FINDINGS: Right femoral neck fracture with angulation and impaction. Fracture is in the subcapital region of the femoral neck. Calcification of the glenoid labrum. Joint space normal. No other pelvic fracture. Advanced disc degeneration L5-S1 IMPRESSION: Subcapital angulated fracture of the right femur. Electronically Signed   By: Franchot Gallo M.D.   On: 07/03/2017 12:03    ROS Blood pressure (!) 147/69, pulse 60, temperature 97.6 F (36.4 C), resp. rate 18, height 5' 3" (1.6 m), weight 106 lb 12.8 oz (48.4 kg), SpO2 100 %. Physical Exam  Constitutional: She is oriented to person, place, and time. She appears well-nourished.   Eyes: Right eye exhibits no discharge. Left eye exhibits no discharge. No scleral icterus.  Neck: Neck supple. No JVD present. No tracheal deviation present.  Cardiovascular: Intact distal pulses.   Respiratory: Effort normal. No stridor.  GI: Soft. She exhibits no distension.  Neurological: She is alert and oriented to person, place, and time. She has normal reflexes. She exhibits normal muscle tone. Coordination normal.  Skin: Skin is warm and dry. No rash noted. No erythema. No pallor.  Psychiatric: She has a normal mood and affect. Her behavior is normal. Thought content normal.   Right and left upper extremity  Inspection normal alignment no tenderness, range of motion full, motor exam normal, laxity none, neurovascular exam intact lymph nodes negative  Left lower extremity inspection normal alignment, no tenderness, full range of motion, motor exam normal, laxity none, neurovascular exam is intact if note negative  Right lower extremity inspection external rotation shortened deformity tenderness proximal femur, range of motion not tested because of pain motor exam normal muscle tone, laxity none in the knee and ankle, neurovascular exam is intact lymph nodes negative.   Assessment/Plan: She is 83 years old she has a right hip fracture she does not use any assistive devices at home. She will need a bipolar replacement once her 48 hours off of eloquis  has been completed  I discussed the risks and benefits of surgery with her versus nonoperative treatment  She agrees to have a right hip partial replacement  Arther Abbott 07/04/2017, 7:29 AM

## 2017-07-05 NOTE — Brief Op Note (Addendum)
07/03/2017 - 07/05/2017  9:23 AM  PATIENT:  Theresa Kennedy  83 y.o. female  PRE-OPERATIVE DIAGNOSIS:  RIGHT hip fracture  POST-OPERATIVE DIAGNOSIS:  RIGHT hip fracture  PROCEDURE:  Procedure(s): ARTHROPLASTY BIPOLAR HIP (HEMIARTHROPLASTY) (Right)   [facility] basic 49 head +5 neck 2 stem direct lateral approach   SURGEON:  Surgeon(s) and Role:    Vickki Hearing, MD - Primary  PHYSICIAN ASSISTANT:   ASSISTANTS: ashley    ANESTHESIA:   spinal  EBL:  Total I/O In: 1600 [I.V.:1600] Out: 500 [Urine:400; Blood:100]  BLOOD ADMINISTERED:none  DRAINS: none   LOCAL MEDICATIONS USED:  MARCAINE     SPECIMEN:  No Specimen  DISPOSITION OF SPECIMEN:  N/A  COUNTS:  YES  TOURNIQUET:  * No tourniquets in log *  DICTATION: .Dragon Dictation  PLAN OF CARE: Admit to inpatient   PATIENT DISPOSITION:  PACU - hemodynamically stable.   Delay start of Pharmacological VTE agent (>24hrs) due to surgical blood loss or risk of bleeding: yes  [phone]

## 2017-07-05 NOTE — Interval H&P Note (Signed)
History and Physical Interval Note:  07/05/2017 7:20 AM  Theresa Kennedy  has presented today for surgery, with the diagnosis of RIGHT hip fracture  The various methods of treatment have been discussed with the patient and family. After consideration of risks, benefits and other options for treatment, the patient has consented to  Procedure(s): ARTHROPLASTY BIPOLAR HIP (HEMIARTHROPLASTY) (Right) as a surgical intervention .  The patient's history has been reviewed, patient examined, no change in status, stable for surgery.  I have reviewed the patient's chart and labs.  Questions were answered to the patient's satisfaction.     Fuller Canada

## 2017-07-05 NOTE — Transfer of Care (Signed)
Immediate Anesthesia Transfer of Care Note  Patient: Theresa Kennedy  Procedure(s) Performed: Procedure(s): ARTHROPLASTY BIPOLAR HIP (HEMIARTHROPLASTY) (Right)  Patient Location: PACU  Anesthesia Type:Spinal  Level of Consciousness: awake, alert , oriented and patient cooperative  Airway & Oxygen Therapy: Patient Spontanous Breathing and Patient connected to face mask oxygen  Post-op Assessment: Report given to RN and Post -op Vital signs reviewed and stable  Post vital signs: Reviewed and stable  Last Vitals:  Vitals:   07/05/17 0434 07/05/17 0648  BP: (!) 143/83 (!) 164/88  Pulse: 77 82  Resp: 18 16  Temp: 36.4 C 36.5 C  SpO2: 92% 97%    Last Pain:  Vitals:   07/05/17 0648  TempSrc: Oral  PainSc: 7       Patients Stated Pain Goal: 3 (07/05/17 5009)  Complications: No apparent anesthesia complications

## 2017-07-05 NOTE — Op Note (Signed)
Operative report  Preop diagnosis right femoral neck fracture  Postop diagnosis same  Procedure right hip bipolar replacement  Surgeon Romeo Apple  Assisted by [place]  Operative findings complete femoral neck fracture with mild arthritis of the femoral head and acetabulum  Spinal anesthetic  location  Sponge and needle count correct  Dragon Dictation  There were no specimens  Clean case  The surgery was done as follows  The patient was identified in the preop area as Theresa Kennedy. We used the appropriate more markings and identifiers and marked the right hip and confirmed as surgical site. Chart update completed  Patient taken surgery given 2 g of Ancef and then spinal anesthetic was administered  This went well, location  We did place a Foley catheter  After appropriate positioning and timeout and the direct lateral position a direct lateral approach to the hip was performed  Direct lateral incision over the greater trochanter extended proximally and distally down to the fascia. The fascia was split in line with the skin incision  Deep retractors were placed  The abductors were removed from the trochanter taken the anterior half and reflecting it proximally  We preserve the capsule we made a T-shaped capsular incision and preserved it. We dislocated the hip and we found some mild arthritis over the head and in the acetabulum. There was a anterior dislocation to get to the acetabulum and hip.  We prepared the proximal femur per manufacture technique  We then did a trial reduction with a size 2 stem a 49 head a 1.5 neck. I felt that was a little short and the stability was not what I wanted so I changed to a 1.5 neck length with a 49 had a #2 prosthesis and that fit excellent and had good reduction and stability in all positions  We removed the trial prosthesis placed 2 drill holes in the proximal femur past the #5 Ethibond suture and then placed the permanent  implants  We repeated our reduction and testing of stability. The patient had excellent shuck good flexion stable in the sleep position Internal and external rotation  We then irrigated the hip repaired the capsule with #1 Bralon  We repaired the abductors to the greater trochanter with the #5 Ethibond suture and then oversewed that with #1 Bralon  We then abducted the hip and repaired the fascia with #1 Bralon in running fashion  This was followed by injection of 30 mL of Marcaine beneath the fascia and the subcutaneous tissue was closed with 0 Monocryl in interrupted fashion skin approximated with staples  Sterile dressing was applied  Patient was taken back to her regular bed leg lengths were checked and found to be equal. Abduction pillow was placed and patient was taken recovery room in stable condition  Postoperative plan  Staples out at postop day 12-14 Resume eloquis in  48 hours Weightbearing as tolerated Direct lateral precautions Follow up in 2 weeks

## 2017-07-05 NOTE — Anesthesia Postprocedure Evaluation (Signed)
Anesthesia Post Note  Patient: Theresa Kennedy  Procedure(s) Performed: Procedure(s) (LRB): ARTHROPLASTY BIPOLAR HIP (HEMIARTHROPLASTY) (Right)  Patient location during evaluation: PACU Anesthesia Type: Spinal Level of consciousness: awake and alert, oriented and patient cooperative Pain management: pain level controlled Vital Signs Assessment: post-procedure vital signs reviewed and stable Respiratory status: spontaneous breathing Cardiovascular status: stable Postop Assessment: no signs of nausea or vomiting and spinal receding Anesthetic complications: no     Last Vitals:  Vitals:   07/05/17 1015 07/05/17 1021  BP: (!) 99/52 125/69  Pulse: (!) 56 85  Resp: 14 12  Temp:    SpO2: 100% 98%    Last Pain:  Vitals:   07/05/17 0926  TempSrc:   PainSc: 0-No pain                 Kyzen Horn A

## 2017-07-05 NOTE — Progress Notes (Signed)
Pt off floor to pre-op.

## 2017-07-05 NOTE — Progress Notes (Signed)
PROGRESS NOTE  MAISEE VOLLMAN ZOX:[id number] DOB: 13-Jan-1921 DOA: 07/03/2017 PCP: Joette Catching, MD Brief History:  83 y.o.femalewith medical history of stroke, essential hypertension, hyperlipidemia, and atrial fibrillation presented with a mechanical fall onto her right side while at an exercise dance class on the morning of 07/03/2017. The patient states that her feet got tangled up and resulted in a mechanical fall onto her right side. Because she had immense pain, and she was not able to get up, EMS was activated. Patient denies fevers, chills, headache, chest pain, dyspnea, nausea, vomiting, diarrhea, abdominal pain, dysuria, hematuria, hematochezia, and melena. In the emergency department, BMP and CBC were essentially unremarkable except for a sodium of 133. Chest x-ray showed apical scarring and hyperinflation. X-ray of the right hip revealed a subcapital angulated fracture of the right femur. EKG showed atrial fibrillation with nonspecific T-wave changes.  Assessment/Plan: Subcapital right femur fracture  -07/05/17--R-hip bipolar replacement -resume apixaban per Dr. Romeo Apple -pain control -PT/OT 07/06/17  Atrial fibrillation  -Rate controlled  -resume apixaban per Dr. Romeo Apple -Continue metoprolol succinate   Essential hypertension -Continue amlodipine, losartan, metoprolol succinate  Hx of stroke -occurred in 2014 -pt without difficulty in performing ADLs -IV heparin bridge -07/03/2017 CT brain negative for acute findings. Reveals old right frontal lobe infarct. -Continue statin  Hyponatremia -chronic -am BMP   Disposition Plan:   Home vs SNF in 1-2 days  Family Communication:   no family present  Consultants:  Ortho--[place]  Code Status:  FULL   DVT Prophylaxis:  resume apixaban on 8/19 per Dr. Romeo Apple   Procedures: As Listed in Progress Note Above  Antibiotics: None    Subjective: Patient states the pain is controlled. She  denies any fevers, chills, chest pain, short of breath, nausea, vomiting, diarrhea, abdominal pain.  Objective: Vitals:   07/05/17 1230 07/05/17 1330 07/05/17 1430 07/05/17 1530  BP: (!) 129/55 (!) 112/52 124/62 (!) 132/49  Pulse: 76 68 73 75  Resp: 17 16 18 17   Temp:      TempSrc:      SpO2: 99% 99% 100% 99%  Weight:      Height:        Intake/Output Summary (Last 24 hours) at 07/05/17 1753 Last data filed at 07/05/17 1707  Gross per 24 hour  Intake          2784.17 ml  Output              550 ml  Net          2234.17 ml   Weight change:  Exam:   General:  Pt is alert, follows commands appropriately, not in acute distress  HEENT: No icterus, No thrush, No neck mass, [place]/AT  Cardiovascular: IRRR, S1/S2, no rubs, no gallops  Respiratory: Bibasilar crackles. No wheeze.  Abdomen: Soft/+BS, non tender, non distended, no guarding  Extremities: No edema, No lymphangitis, No petechiae, No rashes, no synovitis   Data Reviewed: I have personally reviewed following labs and imaging studies Basic Metabolic Panel:  Recent Labs Lab 07/03/17 1218 07/04/17 0552  NA 133* 132*  K 3.8 4.1  CL 95* 94*  CO2 27 30  GLUCOSE 164* 123*  BUN 21* 21*  CREATININE 0.86 0.72  CALCIUM 10.1 9.3   Liver Function Tests: No results for input(s): AST, ALT, ALKPHOS, BILITOT, PROT, ALBUMIN in the last 168 hours. No results for input(s): LIPASE, AMYLASE in the last 168 hours. No results for input(s):  AMMONIA in the last 168 hours. Coagulation Profile:  Recent Labs Lab 07/03/17 1218  INR 1.08   CBC:  Recent Labs Lab 07/03/17 1218 07/04/17 0552 07/05/17 0612  WBC 8.6 11.3* 10.9*  NEUTROABS 5.9  --   --   HGB 13.9 13.4 15.0  HCT 40.5 38.7 44.1  MCV 95.5 94.4 95.5  PLT 169 159 167   Cardiac Enzymes: No results for input(s): CKTOTAL, CKMB, CKMBINDEX, TROPONINI in the last 168 hours. BNP: Invalid input(s): POCBNP CBG: No results for input(s): GLUCAP in the last 168  hours. HbA1C: No results for input(s): HGBA1C in the last 72 hours. Urine analysis:    Component Value Date/Time   COLORURINE YELLOW 11/21/2015 1337   APPEARANCEUR CLEAR 11/21/2015 1337   LABSPEC 1.010 11/21/2015 1337   PHURINE 7.5 11/21/2015 1337   GLUCOSEU NEGATIVE 11/21/2015 1337   HGBUR NEGATIVE 11/21/2015 1337   BILIRUBINUR NEGATIVE 11/21/2015 1337   KETONESUR NEGATIVE 11/21/2015 1337   PROTEINUR NEGATIVE 11/21/2015 1337   UROBILINOGEN 0.2 11/21/2014 1341   NITRITE NEGATIVE 11/21/2015 1337   LEUKOCYTESUR NEGATIVE 11/21/2015 1337   Sepsis Labs: @LABRCNTIP (procalcitonin:4,lacticidven:4) ) Recent Results (from the past 240 hour(s))  Surgical PCR screen     Status: None   Collection Time: 07/03/17  6:00 PM  Result Value Ref Range Status   MRSA, PCR NEGATIVE NEGATIVE Final   Staphylococcus aureus NEGATIVE NEGATIVE Final    Comment:        The Xpert SA Assay (FDA approved for NASAL specimens in patients over 61 years of age), is one component of a comprehensive surveillance program.  Test performance has been validated by [facility] for patients greater than or equal to 72 year old. It is not intended to diagnose infection nor to guide or monitor treatment.      Scheduled Meds: . amLODipine  10 mg Oral Daily  . [START ON 07/07/2017] apixaban  2.5 mg Oral BID  . atorvastatin  10 mg Oral q1800  . calcium-vitamin D  1 tablet Oral Daily  . [START ON 07/07/2017] carbamide peroxide  5 drop Both EARS Weekly  . docusate sodium  100 mg Oral Daily  . feeding supplement (ENSURE ENLIVE)  237 mL Oral BID BM  . losartan  50 mg Oral QPM  . mouth rinse  15 mL Mouth Rinse BID  . metoprolol tartrate  25 mg Oral BID  . multivitamin with minerals  1 tablet Oral Daily  . omega-3 acid ethyl esters  1 g Oral Daily   Continuous Infusions: . sodium chloride 50 mL/hr at 07/05/17 1126  .  ceFAZolin (ANCEF) IV Stopped (07/05/17 1648)    Procedures/Studies: Dg Chest 1 View  Result  Date: 07/03/2017 CLINICAL DATA:  Hip fracture EXAM: CHEST 1 VIEW COMPARISON:  11/21/2015 FINDINGS: COPD with hyperinflation. Extensive apical scarring bilaterally unchanged. Negative for heart failure or pneumonia. Negative for pleural effusion. IMPRESSION: COPD with apical scarring. No acute abnormality and unchanged from the prior study. Electronically Signed   By: Marlan Palau M.D.   On: 07/03/2017 12:01   Ct Head Wo Contrast  Result Date: 07/03/2017 CLINICAL DATA:  Fall at aerobics class. EXAM: CT HEAD WITHOUT CONTRAST TECHNIQUE: Contiguous axial images were obtained from the base of the skull through the vertex without intravenous contrast. COMPARISON:  11/21/2015 FINDINGS: Brain: No evidence of acute infarction, hemorrhage, hydrocephalus, extra-axial collection or mass lesion/mass effect. Chronic right frontal lobe encephalomalacia identified There is mild diffuse low-attenuation within the subcortical and periventricular white matter compatible  with chronic microvascular disease. Prominence of the sulci and ventricles noted. Vascular: No hyperdense vessel or unexpected calcification. Skull: Normal. Negative for fracture or focal lesion. Sinuses/Orbits: No acute finding. Other: None. IMPRESSION: 1. No acute intracranial abnormality. 2. Chronic small vessel ischemic disease and brain atrophy. 3. Chronic right frontal lobe infarct. Electronically Signed   By: Signa Kell M.D.   On: 07/03/2017 20:29   Pelvis Portable  Result Date: 07/05/2017 CLINICAL DATA:  Postop hip fracture EXAM: PORTABLE PELVIS 1-2 VIEWS COMPARISON:  07/03/2017 FINDINGS: Post right hip replacement. Normal AP alignment. No hardware or bony complicating feature. Mild degenerative changes in the left hip joint. IMPRESSION: Right hip replacement.  No complicating feature. Electronically Signed   By: Charlett Nose M.D.   On: 07/05/2017 10:42   Dg Hip Unilat With Pelvis 2-3 Views Right  Result Date: 07/03/2017 CLINICAL DATA:  Fall.   Right hip pain EXAM: DG HIP (WITH OR WITHOUT PELVIS) 2-3V RIGHT COMPARISON:  None. FINDINGS: Right femoral neck fracture with angulation and impaction. Fracture is in the subcapital region of the femoral neck. Calcification of the glenoid labrum. Joint space normal. No other pelvic fracture. Advanced disc degeneration L5-S1 IMPRESSION: Subcapital angulated fracture of the right femur. Electronically Signed   By: Marlan Palau M.D.   On: 07/03/2017 12:03    Jaeson Molstad, DO  [facility] Pager [phone]  If 7PM-7AM, please contact night-coverage [email or web address] Password [email or web address] 07/05/2017, 5:53 PM   LOS: 2 days

## 2017-07-06 ENCOUNTER — Inpatient Hospital Stay (HOSPITAL_COMMUNITY): Payer: Medicare HMO

## 2017-07-06 DIAGNOSIS — R140 Abdominal distension (gaseous): Secondary | ICD-10-CM

## 2017-07-06 LAB — BASIC METABOLIC PANEL
Anion gap: 7 (ref 5–15)
BUN: 27 mg/dL — AB (ref 6–20)
CO2: 32 mmol/L (ref 22–32)
Calcium: 9.3 mg/dL (ref 8.9–10.3)
Chloride: 92 mmol/L — ABNORMAL LOW (ref 101–111)
Creatinine, Ser: 0.77 mg/dL (ref 0.44–1.00)
GFR calc Af Amer: >60 mL/min (ref >60)
GLUCOSE: 160 mg/dL — AB (ref 65–99)
POTASSIUM: 4.1 mmol/L (ref 3.5–5.1)
Sodium: 131 mmol/L — ABNORMAL LOW (ref 135–145)

## 2017-07-06 LAB — CBC
HEMATOCRIT: 33.7 % — AB (ref 36.0–46.0)
Hemoglobin: 11.7 g/dL — ABNORMAL LOW (ref 12.0–15.0)
MCH: 33.1 pg (ref 26.0–34.0)
MCHC: 34.7 g/dL (ref 30.0–36.0)
MCV: 95.5 fL (ref 78.0–100.0)
PLATELETS: 154 10*3/uL (ref 150–400)
RBC: 3.53 MIL/uL — ABNORMAL LOW (ref 3.87–5.11)
RDW: 13.1 % (ref 11.5–15.5)
WBC: 10 10*3/uL (ref 4.0–10.5)

## 2017-07-06 MED ORDER — BISACODYL 10 MG RE SUPP
10.0000 mg | Freq: Once | RECTAL | Status: AC
  Administered 2017-07-06: 10 mg via RECTAL
  Filled 2017-07-06: qty 1

## 2017-07-06 MED ORDER — MILK AND MOLASSES ENEMA
1.0000 | Freq: Once | RECTAL | Status: AC
  Administered 2017-07-06: 250 mL via RECTAL

## 2017-07-06 NOTE — Evaluation (Signed)
Physical Therapy Evaluation Patient Details Name: Theresa Kennedy MRN: [id number] DOB: March 02, 1921 Today's Date: 07/06/2017   History of Present Illness  Theresa Kennedy is a 83 y.o. female with medical history of stroke, essential hypertension, hyperlipidemia, and atrial fibrillation presented with a mechanical fall onto her right side while at an exercise dance class on the morning of 07/03/2017. The patient states that her feet got tangled up and resulted in a mechanical fall onto her right side. Because she had immense pain, and she was not able to get up, EMS was activated. Patient denies fevers, chills, headache, chest pain, dyspnea, nausea, vomiting, diarrhea, abdominal pain, dysuria, hematuria, hematochezia, and melena.  Clinical Impression  Pt received in bed from nursing tech and was agreeable to PT evaluation. Pt admitted with the above diagnosis. Pt from home where she lives alone and was independent with all ADLs, IADLs, ambulation, still driving, and working out 3x/week. Pt was educated on her precautions and a sheet was left in her room; pt verbalized understanding of all of her R hip precautions. Pt currently required assistance for bed mobility, transfers, and STP bed > chair with RW; she was min guard to min A throughout all functional tasks this date. Pt also demo'd mild impulsivity throughout session as she had to be cued to wait for PT to get equipment set up for transfers. Pt verbalized that she does not have anyone that could stay with her once ready for discharge and she stated herself that she is not safe to go home alone right now. Due to decreased caregiver support and pt's current functional deficits, recommend SNF upon d/c. Pt agreeable to this plan.      Follow Up Recommendations SNF    Equipment Recommendations  None recommended by PT    Recommendations for Other Services       Precautions / Restrictions Precautions Precautions: Fall Restrictions RLE Weight  Bearing: Weight bearing as tolerated      Mobility  Bed Mobility Overal bed mobility: Needs Assistance Bed Mobility: Rolling;Supine to Sit Rolling: Min guard;Min assist   Supine to sit: Min assist;HOB elevated     General bed mobility comments: min A for RLE supine > sit in order to maintain precautions; min A for upper body  Transfers Overall transfer level: Needs assistance Equipment used: Rolling walker (2 wheeled) Transfers: Sit to/from [employer] Sit to Stand: Min guard;Min assist Stand pivot transfers: Min guard;Min assist       General transfer comment: STP bed > chair  Ambulation/Gait                Stairs            Wheelchair Mobility    Modified Rankin (Stroke Patients Only)       Balance Overall balance assessment: Needs assistance Sitting-balance support: Feet supported Sitting balance-Leahy Scale: Good     Standing balance support: Bilateral upper extremity supported Standing balance-Leahy Scale: Fair                               Pertinent Vitals/Pain Pain Assessment: 0-10 Pain Score: 8  Pain Location: pain in throat, feels dry. Denies any hip pain Pain Intervention(s): Limited activity within patient's tolerance;Monitored during session;Repositioned    Home Living Family/patient expects to be discharged to:: Private residence Living Arrangements: Alone Available Help at Discharge:  (pt states she has a couple that checks on her but she does  not have anyone that could stay with her 24/7 once ready for d/c) Type of Home: House Home Access: Stairs to enter Entrance Stairs-Rails: Right Entrance Stairs-Number of Steps: 2 Home Layout: Two level;Able to live on main level with bedroom/bathroom Home Equipment: Grab bars - tub/shower;Walker - 2 wheels;Cane - single point      Prior Function Level of Independence: Independent         Comments: independent with all ADLs, IADLs, ambulation without  assistive device. Working out 3x/week     [employer]   Dominant Hand: Right    Extremity/Trunk Assessment   Upper Extremity Assessment Upper Extremity Assessment: Overall WFL for tasks assessed    Lower Extremity Assessment Lower Extremity Assessment: Generalized weakness    Cervical / Trunk Assessment Cervical / Trunk Assessment: Kyphotic  Communication   Communication: No difficulties  Cognition Arousal/Alertness: Awake/alert Behavior During Therapy: WFL for tasks assessed/performed Overall Cognitive Status: Within Functional Limits for tasks assessed                                        General Comments      Exercises Total Joint Exercises Ankle Circles/Pumps: Both;10 reps;Seated Quad Sets: Both;10 reps;Seated Gluteal Sets: Both;10 reps;Seated   Assessment/Plan    PT Assessment Patient needs continued PT services  PT Problem List Decreased strength;Decreased activity tolerance;Decreased balance;Decreased mobility       PT Treatment Interventions DME instruction;Gait training;Functional mobility training;Therapeutic activities;Therapeutic exercise;Patient/family education    PT Goals (Current goals can be found in the Care Plan section)  Acute Rehab PT Goals Patient Stated Goal: to go home PT Goal Formulation: With patient Time For Goal Achievement: 07/10/17 Potential to Achieve Goals: Good    Frequency 7X/week   Barriers to discharge Decreased caregiver support pt lives alone and has a couple that checks on her but she states that she does not have anyone that could stay with her to assist her at home once discharged    Co-evaluation               AM-PAC PT "6 Clicks" Daily Activity  Outcome Measure Difficulty turning over in bed (including adjusting bedclothes, sheets and blankets)?: A Little Difficulty moving from lying on back to sitting on the side of the bed? : A Little Difficulty sitting down on and standing up from  a chair with arms (e.g., wheelchair, bedside commode, etc,.)?: A Little Help needed moving to and from a bed to chair (including a wheelchair)?: A Little Help needed walking in hospital room?: A Lot Help needed climbing 3-5 steps with a railing? : Total 6 Click Score: 15    End of Session Equipment Utilized During Treatment: Gait belt;Oxygen Activity Tolerance: Patient tolerated treatment well;No increased pain Patient left: in chair;with call bell/phone within reach;with nursing/sitter in room Nurse Communication: Mobility status PT Visit Diagnosis: Muscle weakness (generalized) (M62.81);Unsteadiness on feet (R26.81);Difficulty in walking, not elsewhere classified (R26.2)    Time: 0900-0930 PT Time Calculation (min) (ACUTE ONLY): 30 min   Charges:   PT Evaluation $PT Eval Low Complexity: 1 Low PT Treatments $Therapeutic Activity: 8-22 mins   PT G Codes:   PT G-Codes **NOT FOR INPATIENT CLASS** Functional Assessment Tool Used: AM-PAC 6 Clicks Basic Mobility;Clinical judgement Functional Limitation: Mobility: Walking and moving around Mobility: Walking and Moving Around Current Status (V7846): At least 60 percent but less than 80 percent impaired, limited or  restricted Mobility: Walking and Moving Around Goal Status [phone]): At least 40 percent but less than 60 percent impaired, limited or restricted     Jac Canavan PT, DPT

## 2017-07-06 NOTE — Progress Notes (Signed)
Subjective: 1 Day Post-Op Procedure(s) (LRB): ARTHROPLASTY BIPOLAR HIP (HEMIARTHROPLASTY) (Right) Patient reports pain as mild.    Objective: BP (!) 159/78 (BP Location: Right Arm)   Pulse 85   Temp 97.9 F (36.6 C) (Oral)   Resp 18   Ht 5\' 3"  (1.6 m)   Wt 106 lb 12.8 oz (48.4 kg)   SpO2 95%   BMI 18.92 kg/m   Intake/Output from previous day: 08/17 0701 - 08/18 0700 In: 3597.5 [P.O.:720; I.V.:2777.5; IV Piggyback:100] Out: 825 [Urine:725; Blood:100] Intake/Output this shift: No intake/output data recorded.   Recent Labs  07/03/17 1218 07/04/17 0552 07/05/17 0612  HGB 13.9 13.4 15.0    Recent Labs  07/04/17 0552 07/05/17 0612  WBC 11.3* 10.9*  RBC 4.10 4.62  HCT 38.7 44.1  PLT 159 167    Recent Labs  07/03/17 1218 07/04/17 0552  NA 133* 132*  K 3.8 4.1  CL 95* 94*  CO2 27 30  BUN 21* 21*  CREATININE 0.86 0.72  GLUCOSE 164* 123*  CALCIUM 10.1 9.3    Recent Labs  07/03/17 1218  INR 1.08    Neurologically intact Neurovascular intact Sensation intact distally Intact pulses distally Dorsiflexion/Plantar flexion intact  Assessment/Plan: 1 Day Post-Op Procedure(s) (LRB): ARTHROPLASTY BIPOLAR HIP (HEMIARTHROPLASTY) (Right) Advance diet Up with therapy D/C IV fluids  Theresa Kennedy 07/06/2017, 8:08 AM    Postoperative plan   Staples out at postop day 12-14 Resume eloquis in  48 hours Weightbearing as tolerated Direct lateral precautions Follow up in 2 weeks

## 2017-07-06 NOTE — Progress Notes (Signed)
PROGRESS NOTE  Theresa Kennedy ZOX:[id number] DOB: 02/01/1921 DOA: 07/03/2017 PCP: Joette Catching, MD  Brief History: 83 y.o.femalewith medical history of stroke, essential hypertension, hyperlipidemia, and atrial fibrillation presented with a mechanical fall onto her right side while at an exercise dance class on the morning of 07/03/2017. The patient states that her feet got tangled up and resulted in a mechanical fall onto her right side. Because she had immense pain, and she was not able to get up, EMS was activated.  In the emergency department, BMP and CBC were essentially unremarkable except for a sodium of 133. Chest x-ray showed apical scarring and hyperinflation. X-ray of the right hip revealed a subcapital angulated fracture of the right femur. EKG showed atrial fibrillation with nonspecific T-wave changes.  Ortho was consulted to assist in management  Assessment/Plan: Subcapital right femur fracture  -07/05/17--R-hip bipolar replacement -resume apixaban per Dr. Romeo Apple -pain control -PT/OT 07/06/17  Abdominal pain and distension -bladder scan -2 view abd xray   Atrial fibrillation  -Rate controlled  -resume apixaban per Dr. Romeo Apple -Continue metoprolol succinate   Essential hypertension -Continue amlodipine, losartan, metoprolol succinate  Hx of stroke -occurred in 2014 -pt without difficulty in performing ADLs -IV heparin bridge -07/03/2017 CT brain negative for acute findings. Reveals old right frontal lobe infarct. -Continue statin  Hyponatremia -chronic -am BMP   Disposition Plan: Home vs SNF in 1-2 days  Family Communication: no family present  Consultants: Ortho--[facility]  Code Status: FULL   DVT Prophylaxis: resume apixaban on 8/19 per Dr. Romeo Apple   Procedures: As Listed in Progress Note Above  Antibiotics: Peri-operative cefazolin    Subjective: Patient says her abdomen feels tight this morning. She is  not passing any flatus. She had a bowel movement 2-3 days ago.  She denies any fever, chills, chest pain or shortness breath, nausea, vomiting, dysuria, hematuria. She denies actual abdominal pain.  Objective: Vitals:   07/05/17 1832 07/05/17 2114 07/06/17 0134 07/06/17 0439  BP: (!) 157/82 136/69 135/71 (!) 159/78  Pulse: 84 86 82 85  Resp:  18 18 18   Temp:  98.1 F (36.7 C) 98.2 F (36.8 C) 97.9 F (36.6 C)  TempSrc:  Oral Oral Oral  SpO2:  98% 99% 95%  Weight:      Height:        Intake/Output Summary (Last 24 hours) at 07/06/17 0840 Last data filed at 07/06/17 0300  Gross per 24 hour  Intake           2597.5 ml  Output              825 ml  Net           1772.5 ml   Weight change:  Exam:   General:  Pt is alert, follows commands appropriately, not in acute distress  HEENT: No icterus, No thrush, No neck mass, [place]/AT  Cardiovascular: IRRR, S1/S2, no rubs, no gallops  Respiratory: Bibasilar crackles. No wheezes  Abdomen: Soft/+BS, non tender, non distended, no guarding  Extremities: No edema, No lymphangitis, No petechiae, No rashes, no synovitis   Data Reviewed: I have personally reviewed following labs and imaging studies Basic Metabolic Panel:  Recent Labs Lab 07/03/17 1218 07/04/17 0552 07/06/17 0611  NA 133* 132* 131*  K 3.8 4.1 4.1  CL 95* 94* 92*  CO2 27 30 32  GLUCOSE 164* 123* 160*  BUN 21* 21* 27*  CREATININE 0.86 0.72 0.77  CALCIUM  10.1 9.3 9.3   Liver Function Tests: No results for input(s): AST, ALT, ALKPHOS, BILITOT, PROT, ALBUMIN in the last 168 hours. No results for input(s): LIPASE, AMYLASE in the last 168 hours. No results for input(s): AMMONIA in the last 168 hours. Coagulation Profile:  Recent Labs Lab 07/03/17 1218  INR 1.08   CBC:  Recent Labs Lab 07/03/17 1218 07/04/17 0552 07/05/17 0612 07/06/17 0611  WBC 8.6 11.3* 10.9* 10.0  NEUTROABS 5.9  --   --   --   HGB 13.9 13.4 15.0 11.7*  HCT 40.5 38.7 44.1 33.7*  MCV  95.5 94.4 95.5 95.5  PLT 169 159 167 154   Cardiac Enzymes: No results for input(s): CKTOTAL, CKMB, CKMBINDEX, TROPONINI in the last 168 hours. BNP: Invalid input(s): POCBNP CBG: No results for input(s): GLUCAP in the last 168 hours. HbA1C: No results for input(s): HGBA1C in the last 72 hours. Urine analysis:    Component Value Date/Time   COLORURINE YELLOW 11/21/2015 1337   APPEARANCEUR CLEAR 11/21/2015 1337   LABSPEC 1.010 11/21/2015 1337   PHURINE 7.5 11/21/2015 1337   GLUCOSEU NEGATIVE 11/21/2015 1337   HGBUR NEGATIVE 11/21/2015 1337   BILIRUBINUR NEGATIVE 11/21/2015 1337   KETONESUR NEGATIVE 11/21/2015 1337   PROTEINUR NEGATIVE 11/21/2015 1337   UROBILINOGEN 0.2 11/21/2014 1341   NITRITE NEGATIVE 11/21/2015 1337   LEUKOCYTESUR NEGATIVE 11/21/2015 1337   Sepsis Labs: @LABRCNTIP (procalcitonin:4,lacticidven:4) ) Recent Results (from the past 240 hour(s))  Surgical PCR screen     Status: None   Collection Time: 07/03/17  6:00 PM  Result Value Ref Range Status   MRSA, PCR NEGATIVE NEGATIVE Final   Staphylococcus aureus NEGATIVE NEGATIVE Final    Comment:        The Xpert SA Assay (FDA approved for NASAL specimens in patients over 85 years of age), is one component of a comprehensive surveillance program.  Test performance has been validated by [facility] for patients greater than or equal to 81 year old. It is not intended to diagnose infection nor to guide or monitor treatment.      Scheduled Meds: . amLODipine  10 mg Oral Daily  . [START ON 07/07/2017] apixaban  2.5 mg Oral BID  . atorvastatin  10 mg Oral q1800  . calcium-vitamin D  1 tablet Oral Daily  . [START ON 07/07/2017] carbamide peroxide  5 drop Both EARS Weekly  . docusate sodium  100 mg Oral Daily  . feeding supplement (ENSURE ENLIVE)  237 mL Oral BID BM  . losartan  50 mg Oral QPM  . mouth rinse  15 mL Mouth Rinse BID  . metoprolol tartrate  25 mg Oral BID  . multivitamin with minerals  1  tablet Oral Daily  . omega-3 acid ethyl esters  1 g Oral Daily   Continuous Infusions: . sodium chloride 50 mL/hr at 07/05/17 1847    Procedures/Studies: Dg Chest 1 View  Result Date: 07/03/2017 CLINICAL DATA:  Hip fracture EXAM: CHEST 1 VIEW COMPARISON:  11/21/2015 FINDINGS: COPD with hyperinflation. Extensive apical scarring bilaterally unchanged. Negative for heart failure or pneumonia. Negative for pleural effusion. IMPRESSION: COPD with apical scarring. No acute abnormality and unchanged from the prior study. Electronically Signed   By: Marlan Palau M.D.   On: 07/03/2017 12:01   Ct Head Wo Contrast  Result Date: 07/03/2017 CLINICAL DATA:  Fall at aerobics class. EXAM: CT HEAD WITHOUT CONTRAST TECHNIQUE: Contiguous axial images were obtained from the base of the skull through the vertex without  intravenous contrast. COMPARISON:  11/21/2015 FINDINGS: Brain: No evidence of acute infarction, hemorrhage, hydrocephalus, extra-axial collection or mass lesion/mass effect. Chronic right frontal lobe encephalomalacia identified There is mild diffuse low-attenuation within the subcortical and periventricular white matter compatible with chronic microvascular disease. Prominence of the sulci and ventricles noted. Vascular: No hyperdense vessel or unexpected calcification. Skull: Normal. Negative for fracture or focal lesion. Sinuses/Orbits: No acute finding. Other: None. IMPRESSION: 1. No acute intracranial abnormality. 2. Chronic small vessel ischemic disease and brain atrophy. 3. Chronic right frontal lobe infarct. Electronically Signed   By: Signa Kell M.D.   On: 07/03/2017 20:29   Pelvis Portable  Result Date: 07/05/2017 CLINICAL DATA:  Postop hip fracture EXAM: PORTABLE PELVIS 1-2 VIEWS COMPARISON:  07/03/2017 FINDINGS: Post right hip replacement. Normal AP alignment. No hardware or bony complicating feature. Mild degenerative changes in the left hip joint. IMPRESSION: Right hip replacement.   No complicating feature. Electronically Signed   By: Charlett Nose M.D.   On: 07/05/2017 10:42   Dg Hip Unilat With Pelvis 2-3 Views Right  Result Date: 07/03/2017 CLINICAL DATA:  Fall.  Right hip pain EXAM: DG HIP (WITH OR WITHOUT PELVIS) 2-3V RIGHT COMPARISON:  None. FINDINGS: Right femoral neck fracture with angulation and impaction. Fracture is in the subcapital region of the femoral neck. Calcification of the glenoid labrum. Joint space normal. No other pelvic fracture. Advanced disc degeneration L5-S1 IMPRESSION: Subcapital angulated fracture of the right femur. Electronically Signed   By: Marlan Palau M.D.   On: 07/03/2017 12:03    Genette Huertas, DO  [facility] Pager [phone]  If 7PM-7AM, please contact night-coverage [email or web address] Password [email or web address] 07/06/2017, 8:40 AM   LOS: 3 days

## 2017-07-07 ENCOUNTER — Inpatient Hospital Stay (HOSPITAL_COMMUNITY): Payer: Medicare HMO

## 2017-07-07 DIAGNOSIS — K560 Paralytic ileus: Secondary | ICD-10-CM

## 2017-07-07 DIAGNOSIS — S72009A Fracture of unspecified part of neck of unspecified femur, initial encounter for closed fracture: Secondary | ICD-10-CM

## 2017-07-07 DIAGNOSIS — S72009D Fracture of unspecified part of neck of unspecified femur, subsequent encounter for closed fracture with routine healing: Secondary | ICD-10-CM

## 2017-07-07 DIAGNOSIS — R141 Gas pain: Secondary | ICD-10-CM

## 2017-07-07 DIAGNOSIS — N179 Acute kidney failure, unspecified: Secondary | ICD-10-CM

## 2017-07-07 DIAGNOSIS — R142 Eructation: Secondary | ICD-10-CM

## 2017-07-07 DIAGNOSIS — R143 Flatulence: Secondary | ICD-10-CM

## 2017-07-07 DIAGNOSIS — S72001D Fracture of unspecified part of neck of right femur, subsequent encounter for closed fracture with routine healing: Secondary | ICD-10-CM

## 2017-07-07 LAB — TYPE AND SCREEN
ABO/RH(D): O POS
Antibody Screen: NEGATIVE
UNIT DIVISION: 0
Unit division: 0

## 2017-07-07 LAB — CBC
HEMATOCRIT: 31.5 % — AB (ref 36.0–46.0)
HEMOGLOBIN: 11 g/dL — AB (ref 12.0–15.0)
MCH: 32.7 pg (ref 26.0–34.0)
MCHC: 34.9 g/dL (ref 30.0–36.0)
MCV: 93.8 fL (ref 78.0–100.0)
Platelets: 160 10*3/uL (ref 150–400)
RBC: 3.36 MIL/uL — AB (ref 3.87–5.11)
RDW: 13 % (ref 11.5–15.5)
WBC: 14 10*3/uL — ABNORMAL HIGH (ref 4.0–10.5)

## 2017-07-07 LAB — BPAM RBC
BLOOD PRODUCT EXPIRATION DATE: 201809132359
Blood Product Expiration Date: 201809212359
UNIT TYPE AND RH: 5100
Unit Type and Rh: 5100

## 2017-07-07 LAB — BASIC METABOLIC PANEL
Anion gap: 11 (ref 5–15)
BUN: 51 mg/dL — AB (ref 6–20)
CHLORIDE: 90 mmol/L — AB (ref 101–111)
CO2: 30 mmol/L (ref 22–32)
CREATININE: 1.19 mg/dL — AB (ref 0.44–1.00)
Calcium: 10.4 mg/dL — ABNORMAL HIGH (ref 8.9–10.3)
GFR calc Af Amer: 44 mL/min — ABNORMAL LOW (ref >60)
GFR calc non Af Amer: 38 mL/min — ABNORMAL LOW (ref >60)
GLUCOSE: 173 mg/dL — AB (ref 65–99)
POTASSIUM: 4.4 mmol/L (ref 3.5–5.1)
SODIUM: 131 mmol/L — AB (ref 135–145)

## 2017-07-07 MED ORDER — KCL IN DEXTROSE-NACL 20-5-0.9 MEQ/L-%-% IV SOLN
INTRAVENOUS | Status: DC
  Administered 2017-07-07 – 2017-07-10 (×7): via INTRAVENOUS

## 2017-07-07 MED ORDER — IOPAMIDOL (ISOVUE-300) INJECTION 61%
INTRAVENOUS | Status: AC
  Filled 2017-07-07: qty 50

## 2017-07-07 MED ORDER — SORBITOL 70 % SOLN
960.0000 mL | TOPICAL_OIL | Freq: Once | ORAL | Status: AC
  Administered 2017-07-07: 960 mL via RECTAL
  Filled 2017-07-07: qty 240

## 2017-07-07 NOTE — Progress Notes (Signed)
Patient ID: Theresa Kennedy, female   DOB: 1921-03-26, 83 y.o.   MRN: [id number]  BP (!) 154/80 (BP Location: Left Arm)   Pulse (!) 102   Temp 97.6 F (36.4 C) (Axillary)   Resp 20   Ht 5\' 3"  (1.6 m)   Wt 106 lb 12.8 oz (48.4 kg)   SpO2 100%   BMI 18.92 kg/m   Pod 2   Rt Bipolar hip replacement   Abdominal distension   Ng tube inserted   No BM for hospital admission  CBC Latest Ref Rng & Units 07/07/2017 07/06/2017 07/05/2017  WBC 4.0 - 10.5 K/uL 14.0(H) 10.0 10.9(H)  Hemoglobin 12.0 - 15.0 g/dL 11.0(L) 11.7(L) 15.0  Hematocrit 36.0 - 46.0 % 31.5(L) 33.7(L) 44.1  Platelets 150 - 400 K/uL 160 154 167   BMP Latest Ref Rng & Units 07/07/2017 07/06/2017 07/04/2017  Glucose 65 - 99 mg/dL 580(D) 983(J) 825(K)  BUN 6 - 20 mg/dL 53(Z) 76(B) 34(L)  Creatinine 0.44 - 1.00 mg/dL 9.37(T) 0.24 0.97  Sodium 135 - 145 mmol/L 131(L) 131(L) 132(L)  Potassium 3.5 - 5.1 mmol/L 4.4 4.1 4.1  Chloride 101 - 111 mmol/L 90(L) 92(L) 94(L)  CO2 22 - 32 mmol/L 30 32 30  Calcium 8.9 - 10.3 mg/dL 10.4(H) 9.3 9.3    Pre renal   ivfs with K  Monitor k   Needs BM   Another enema   Stay in bed

## 2017-07-07 NOTE — Progress Notes (Signed)
Received verbal order from Dr. Romeo Apple to increase patient's IV fluids from 50 ml/hour to 100 ml/hr of D5NS w/ 20 KCl.

## 2017-07-07 NOTE — Progress Notes (Signed)
Physical Therapy Treatment Patient Details Name: Theresa Kennedy MRN: [id number] DOB: [birth year] Today's Date: 07/07/2017    History of Present Illness Theresa Kennedy is a 83 y.o. female with medical history of stroke, essential hypertension, hyperlipidemia, and atrial fibrillation presented with a mechanical fall onto her right side while at an exercise dance class on the morning of 07/03/2017. The patient states that her feet got tangled up and resulted in a mechanical fall onto her right side. Because she had immense pain, and she was not able to get up, EMS was activated. Patient denies fevers, chills, headache, chest pain, dyspnea, nausea, vomiting, diarrhea, abdominal pain, dysuria, hematuria, hematochezia, and melena.    PT Comments    Pt received in bed from nursing and Dr. Romeo Apple and per Dr. Romeo Apple, pt appropriate for bed level exercises only this date due to recent NG tube placement for pt's distended abdomen. Pt agreeable to exercises in bed. She tolerated treatment well with no reports of hip pain during any of the exercises but did require min to mod cues for proper technique. Continue to recommend SNF once ready for d/c.    Follow Up Recommendations  SNF     Equipment Recommendations  None recommended by PT    Recommendations for Other Services       Precautions / Restrictions Precautions Precautions: Fall Restrictions Weight Bearing Restrictions: Yes RLE Weight Bearing: Weight bearing as tolerated    Mobility  Bed Mobility                  Transfers                    Ambulation/Gait                 Stairs            Wheelchair Mobility    Modified Rankin (Stroke Patients Only)       Balance                                            Cognition Arousal/Alertness: Awake/alert Behavior During Therapy: WFL for tasks assessed/performed Overall Cognitive Status: Within Functional Limits for tasks  assessed                                        Exercises Total Joint Exercises Ankle Circles/Pumps: Both;Supine;20 reps Quad Sets: Both;20 reps;Supine Gluteal Sets: Both;20 reps;Supine Heel Slides: Both;20 reps;Supine Hip ABduction/ADduction: PROM;Right;20 reps;Supine    General Comments        Pertinent Vitals/Pain Pain Assessment: No/denies pain    Home Living                      Prior Function            PT Goals (current goals can now be found in the care plan section) Acute Rehab PT Goals Patient Stated Goal: to go home PT Goal Formulation: With patient Time For Goal Achievement: 07/10/17 Potential to Achieve Goals: Good    Frequency    7X/week      PT Plan      Co-evaluation              AM-PAC PT "6 Clicks" Daily Activity  Outcome Measure  Difficulty  turning over in bed (including adjusting bedclothes, sheets and blankets)?: A Little Difficulty moving from lying on back to sitting on the side of the bed? : A Little Difficulty sitting down on and standing up from a chair with arms (e.g., wheelchair, bedside commode, etc,.)?: A Little Help needed moving to and from a bed to chair (including a wheelchair)?: A Little Help needed walking in hospital room?: A Lot Help needed climbing 3-5 steps with a railing? : Total 6 Click Score: 15    End of Session   Activity Tolerance: Patient tolerated treatment well;No increased pain Patient left: with call bell/phone within reach;in bed Nurse Communication: Mobility status PT Visit Diagnosis: Muscle weakness (generalized) (M62.81);Unsteadiness on feet (R26.81);Difficulty in walking, not elsewhere classified (R26.2)     Time: 6962-9528 PT Time Calculation (min) (ACUTE ONLY): 18 min  Charges:  $Therapeutic Exercise: 8-22 mins                    G Codes:        Jac Canavan PT, DPT

## 2017-07-07 NOTE — Addendum Note (Signed)
Addendum  created 07/07/17 2924 by Earleen Newport, CRNA   Sign clinical note

## 2017-07-07 NOTE — Progress Notes (Signed)
CTSP re "distended abdomen like a hard rock" Patient had hip Surgery and has obstipation.  GI regimen has been attempted.  She has mild abdominal discomfort, but it is distended. KUB at about 1pm showed air fluid level, distended stomach, with gas and fluid.   She is on narcotics for pain.  She also has not voided.  Bladder US showed but 200 cc of urine.   Will place NGT to ILWS.  D/C narcotics for now.  NPO. Give IVF with D5 NS and 20 mE/L of KCL.  Her Cr is normal.  K is normal.  Houston Siren MD FACP. Hospitalist.

## 2017-07-07 NOTE — Anesthesia Postprocedure Evaluation (Signed)
Anesthesia Post Note  Patient: NATSHA MCKEOWN  Procedure(s) Performed: Procedure(s) (LRB): ARTHROPLASTY BIPOLAR HIP (HEMIARTHROPLASTY) (Right)  Patient location during evaluation: Nursing Unit Anesthesia Type: Spinal Level of consciousness: awake and alert, oriented and patient cooperative Pain management: pain level controlled Vital Signs Assessment: post-procedure vital signs reviewed and stable Respiratory status: respiratory function stable Cardiovascular status: stable : Nausea yesteday; relieved with zofran. Anesthetic complications: no Comments: Chart review     Last Vitals:  Vitals:   07/06/17 2045 07/07/17 0352  BP: (!) 142/75 (!) 154/80  Pulse: 87 (!) 102  Resp: 20 20  Temp: 36.9 C 36.4 C  SpO2: 99% 100%    Last Pain:  Vitals:   07/07/17 0352  TempSrc: Axillary  PainSc:                  ADAMS, AMY A

## 2017-07-07 NOTE — Progress Notes (Addendum)
PROGRESS NOTE  Theresa Kennedy WGN:[id number] DOB: September 02, 1921 DOA: 07/03/2017 PCP: Joette Catching, MD  Brief History: 83 y.o.femalewith medical history of stroke, essential hypertension, hyperlipidemia, and atrial fibrillation presented with a mechanical fall onto her right side while at an exercise dance class on the morning of 07/03/2017. The patient states that her feet got tangled up and resulted in a mechanical fall onto her right side. Because she had immense pain, and she was not able to get up, EMS was activated.  In the emergency department, BMP and CBC were essentially unremarkable except for a sodium of 133. Chest x-ray showed apical scarring and hyperinflation. X-ray of the right hip revealed a subcapital angulated fracture of the right femur. EKG showed atrial fibrillation with nonspecific T-wave changes.  Ortho was consulted to assist in management  Assessment/Plan: Subcapital right femur fracture  -07/05/17--R-hip bipolar replacement -resumeapixaban 8/19 -pain control -PT/OT 07/06/17-->SNF  Abdominal pain and distension/Ileus -bladder scan--no urine -8/18-2 view abd xray-personally reviewed- Air-fluid level over the left upper quadrant;no obstruction -no BM with miralax, bisacodyl, M&M enema -8/18 evening--NG placed LIS--2300 cc output in past 24 hours -obtain CT abd/pelvis -ice chips sips with meds only for now  AKI -due to volume depletion -increase IVF with Kcl -AM BMP -d/c losartan  Atrial fibrillation  -Rate controlled  -resumedapixaban 8/19 per Dr. Romeo Apple -Continue metoprolol succinate   Leukocytosis -likely stress demargination -remains afebrile and hemodynamically stable -am CBC  Essential hypertension -Continue amlodipine, metoprolol succinate -d/c losartan due to AKI  Hx of stroke -occurred in 2014 -pt without difficulty in performing ADLs -07/03/2017 CT brain negative for acute findings. Reveals old right frontal lobe  infarct. -Continue statin  Hyponatremia -chronic -am BMP   Disposition Plan: SNFin 1-2days if ileus resolves Family Communication: niece updated at bedside 8/19  Consultants: Ortho--[facility]  Code Status: FULL   DVT Prophylaxis: resume apixaban on 8/19 per Dr. Romeo Apple   Procedures: As Listed in Progress Note Above  Antibiotics: Peri-operative cefazolin     Subjective: Patient had a small bowel movement with smog enema today. She is passing flatus now. NG tube was inserted to low intermittent suction overnight. Her abdominal distention improved as a result of the NG. She denies any chest pain, shortness breath, nausea, vomiting, diarrhea. No dysuria or hematuria.  Objective: Vitals:   07/06/17 2045 07/07/17 0352 07/07/17 0752 07/07/17 1152  BP: (!) 142/75 (!) 154/80 139/80 132/78  Pulse: 87 (!) 102 89 93  Resp: 20 20 18 19   Temp: 98.4 F (36.9 C) 97.6 F (36.4 C) 98 F (36.7 C) 97.9 F (36.6 C)  TempSrc: Oral Axillary Oral Oral  SpO2: 99% 100% 99% 97%  Weight:      Height:        Intake/Output Summary (Last 24 hours) at 07/07/17 1636 Last data filed at 07/07/17 1600  Gross per 24 hour  Intake          1078.33 ml  Output             1500 ml  Net          -421.67 ml   Weight change:  Exam:   General:  Pt is alert, follows commands appropriately, not in acute distress  HEENT: No icterus, No thrush, No neck mass, [place]/AT  Cardiovascular: IRRR, S1/S2, no rubs, no gallops  Respiratory: Bibasilar crackles. No wheezing. Good air movement.  Abdomen: Soft/+BS, non tender, non distended, no guarding  Extremities: No  edema, No lymphangitis, No petechiae, No rashes, no synovitis   Data Reviewed: I have personally reviewed following labs and imaging studies Basic Metabolic Panel:  Recent Labs Lab 07/03/17 1218 07/04/17 0552 07/06/17 0611 07/07/17 0610  NA 133* 132* 131* 131*  K 3.8 4.1 4.1 4.4  CL 95* 94* 92* 90*  CO2 27 30 32 30    GLUCOSE 164* 123* 160* 173*  BUN 21* 21* 27* 51*  CREATININE 0.86 0.72 0.77 1.19*  CALCIUM 10.1 9.3 9.3 10.4*   Liver Function Tests: No results for input(s): AST, ALT, ALKPHOS, BILITOT, PROT, ALBUMIN in the last 168 hours. No results for input(s): LIPASE, AMYLASE in the last 168 hours. No results for input(s): AMMONIA in the last 168 hours. Coagulation Profile:  Recent Labs Lab 07/03/17 1218  INR 1.08   CBC:  Recent Labs Lab 07/03/17 1218 07/04/17 0552 07/05/17 0612 07/06/17 0611 07/07/17 0610  WBC 8.6 11.3* 10.9* 10.0 14.0*  NEUTROABS 5.9  --   --   --   --   HGB 13.9 13.4 15.0 11.7* 11.0*  HCT 40.5 38.7 44.1 33.7* 31.5*  MCV 95.5 94.4 95.5 95.5 93.8  PLT 169 159 167 154 160   Cardiac Enzymes: No results for input(s): CKTOTAL, CKMB, CKMBINDEX, TROPONINI in the last 168 hours. BNP: Invalid input(s): POCBNP CBG: No results for input(s): GLUCAP in the last 168 hours. HbA1C: No results for input(s): HGBA1C in the last 72 hours. Urine analysis:    Component Value Date/Time   COLORURINE YELLOW 11/21/2015 1337   APPEARANCEUR CLEAR 11/21/2015 1337   LABSPEC 1.010 11/21/2015 1337   PHURINE 7.5 11/21/2015 1337   GLUCOSEU NEGATIVE 11/21/2015 1337   HGBUR NEGATIVE 11/21/2015 1337   BILIRUBINUR NEGATIVE 11/21/2015 1337   KETONESUR NEGATIVE 11/21/2015 1337   PROTEINUR NEGATIVE 11/21/2015 1337   UROBILINOGEN 0.2 11/21/2014 1341   NITRITE NEGATIVE 11/21/2015 1337   LEUKOCYTESUR NEGATIVE 11/21/2015 1337   Sepsis Labs: @LABRCNTIP (procalcitonin:4,lacticidven:4) ) Recent Results (from the past 240 hour(s))  Surgical PCR screen     Status: None   Collection Time: 07/03/17  6:00 PM  Result Value Ref Range Status   MRSA, PCR NEGATIVE NEGATIVE Final   Staphylococcus aureus NEGATIVE NEGATIVE Final    Comment:        The Xpert SA Assay (FDA approved for NASAL specimens in patients over 72 years of age), is one component of a comprehensive surveillance program.  Test  performance has been validated by [facility] for patients greater than or equal to 42 year old. It is not intended to diagnose infection nor to guide or monitor treatment.      Scheduled Meds: . iopamidol      . amLODipine  10 mg Oral Daily  . apixaban  2.5 mg Oral BID  . atorvastatin  10 mg Oral q1800  . carbamide peroxide  5 drop Both EARS Weekly  . docusate sodium  100 mg Oral Daily  . feeding supplement (ENSURE ENLIVE)  237 mL Oral BID BM  . mouth rinse  15 mL Mouth Rinse BID  . metoprolol tartrate  25 mg Oral BID  . multivitamin with minerals  1 tablet Oral Daily  . omega-3 acid ethyl esters  1 g Oral Daily   Continuous Infusions: . dextrose 5 % and 0.9 % NaCl with KCl 20 mEq/L 100 mL/hr at 07/07/17 1044    Procedures/Studies: Dg Chest 1 View  Result Date: 07/03/2017 CLINICAL DATA:  Hip fracture EXAM: CHEST 1 VIEW COMPARISON:  11/21/2015 FINDINGS: COPD with hyperinflation. Extensive apical scarring bilaterally unchanged. Negative for heart failure or pneumonia. Negative for pleural effusion. IMPRESSION: COPD with apical scarring. No acute abnormality and unchanged from the prior study. Electronically Signed   By: Marlan Palau M.D.   On: 07/03/2017 12:01   Dg Abd 1 View  Result Date: 07/07/2017 CLINICAL DATA:  NG tube placement. EXAM: ABDOMEN - 1 VIEW COMPARISON:  July 06, 2017 FINDINGS: An NG tube terminates in the left upper quadrant. The air-fluid level in the stomach is no longer seen. An air-filled structure left upper quadrant is likely mildly distended stomach. No obstruction or definitive ileus. IMPRESSION: 1. The NG tube is in good position. The air-fluid level in the stomach seen previously is no longer visualized. The stomach may remain mildly distended. 2. No obstruction or definitive ileus. Electronically Signed   By: Gerome Sam III M.D   On: 07/07/2017 07:25   Ct Head Wo Contrast  Result Date: 07/03/2017 CLINICAL DATA:  Fall at aerobics class. EXAM: CT  HEAD WITHOUT CONTRAST TECHNIQUE: Contiguous axial images were obtained from the base of the skull through the vertex without intravenous contrast. COMPARISON:  11/21/2015 FINDINGS: Brain: No evidence of acute infarction, hemorrhage, hydrocephalus, extra-axial collection or mass lesion/mass effect. Chronic right frontal lobe encephalomalacia identified There is mild diffuse low-attenuation within the subcortical and periventricular white matter compatible with chronic microvascular disease. Prominence of the sulci and ventricles noted. Vascular: No hyperdense vessel or unexpected calcification. Skull: Normal. Negative for fracture or focal lesion. Sinuses/Orbits: No acute finding. Other: None. IMPRESSION: 1. No acute intracranial abnormality. 2. Chronic small vessel ischemic disease and brain atrophy. 3. Chronic right frontal lobe infarct. Electronically Signed   By: Signa Kell M.D.   On: 07/03/2017 20:29   Pelvis Portable  Result Date: 07/05/2017 CLINICAL DATA:  Postop hip fracture EXAM: PORTABLE PELVIS 1-2 VIEWS COMPARISON:  07/03/2017 FINDINGS: Post right hip replacement. Normal AP alignment. No hardware or bony complicating feature. Mild degenerative changes in the left hip joint. IMPRESSION: Right hip replacement.  No complicating feature. Electronically Signed   By: Charlett Nose M.D.   On: 07/05/2017 10:42   Dg Abd 2 Views  Result Date: 07/06/2017 CLINICAL DATA:  Abdominal distention. EXAM: ABDOMEN - 2 VIEW COMPARISON:  February 14, 2015 FINDINGS: There is an air-fluid level in the left upper quadrant. Statistically, this is most likely to be within a distended stomach. No air under the right hemidiaphragm. Probable hiatal hernia. Lung bases otherwise normal. Rounded density over the left mid abdomen. No bowel obstruction. No other acute abnormalities. Scoliotic and degenerative changes of the spine. IMPRESSION: 1. Air-fluid level over the left upper quadrant is statistically most likely to represent a  stomach distended with gas and fluid. No free air as right hemidiaphragm. 2. There is a rounded density over the left mid abdomen on one view which is nonspecific. This could be artifactual or something on the patient. This was not seen previously. Recommend attention on follow-up. 3. No other acute abnormalities. Electronically Signed   By: Gerome Sam III M.D   On: 07/06/2017 13:35   Dg Hip Unilat With Pelvis 2-3 Views Right  Result Date: 07/03/2017 CLINICAL DATA:  Fall.  Right hip pain EXAM: DG HIP (WITH OR WITHOUT PELVIS) 2-3V RIGHT COMPARISON:  None. FINDINGS: Right femoral neck fracture with angulation and impaction. Fracture is in the subcapital region of the femoral neck. Calcification of the glenoid labrum. Joint space normal. No other pelvic fracture. Advanced  disc degeneration L5-S1 IMPRESSION: Subcapital angulated fracture of the right femur. Electronically Signed   By: Marlan Palau M.D.   On: 07/03/2017 12:03    Geniene List, DO  [facility] Pager [phone]  If 7PM-7AM, please contact night-coverage [email or web address] Password [email or web address] 07/07/2017, 4:36 PM   LOS: 4 days

## 2017-07-08 ENCOUNTER — Inpatient Hospital Stay (HOSPITAL_COMMUNITY): Payer: Medicare HMO

## 2017-07-08 ENCOUNTER — Encounter (HOSPITAL_COMMUNITY): Payer: Self-pay | Admitting: Orthopedic Surgery

## 2017-07-08 DIAGNOSIS — I482 Chronic atrial fibrillation: Secondary | ICD-10-CM

## 2017-07-08 DIAGNOSIS — G9341 Metabolic encephalopathy: Secondary | ICD-10-CM

## 2017-07-08 LAB — COMPREHENSIVE METABOLIC PANEL
ALBUMIN: 3.3 g/dL — AB (ref 3.5–5.0)
ALT: 26 U/L (ref 14–54)
ANION GAP: 13 (ref 5–15)
AST: 68 U/L — ABNORMAL HIGH (ref 15–41)
Alkaline Phosphatase: 32 U/L — ABNORMAL LOW (ref 38–126)
BILIRUBIN TOTAL: 0.9 mg/dL (ref 0.3–1.2)
BUN: 57 mg/dL — ABNORMAL HIGH (ref 6–20)
CO2: 28 mmol/L (ref 22–32)
Calcium: 9.3 mg/dL (ref 8.9–10.3)
Chloride: 93 mmol/L — ABNORMAL LOW (ref 101–111)
Creatinine, Ser: 1.33 mg/dL — ABNORMAL HIGH (ref 0.44–1.00)
GFR calc Af Amer: 39 mL/min — ABNORMAL LOW (ref >60)
GFR, EST NON AFRICAN AMERICAN: 33 mL/min — AB (ref >60)
Glucose, Bld: 183 mg/dL — ABNORMAL HIGH (ref 65–99)
POTASSIUM: 4 mmol/L (ref 3.5–5.1)
Sodium: 134 mmol/L — ABNORMAL LOW (ref 135–145)
TOTAL PROTEIN: 5.2 g/dL — AB (ref 6.5–8.1)

## 2017-07-08 LAB — BLOOD GAS, ARTERIAL
ACID-BASE EXCESS: 7.5 mmol/L — AB (ref 0.0–2.0)
Bicarbonate: 31 mmol/L — ABNORMAL HIGH (ref 20.0–28.0)
Drawn by: 234301
FIO2: 21
O2 SAT: 86.2 %
PCO2 ART: 42.1 mmHg (ref 32.0–48.0)
PH ART: 7.483 — AB (ref 7.350–7.450)
PO2 ART: 53.9 mmHg — AB (ref 83.0–108.0)
Patient temperature: 37

## 2017-07-08 LAB — CBC
HEMATOCRIT: 26.7 % — AB (ref 36.0–46.0)
Hemoglobin: 9.2 g/dL — ABNORMAL LOW (ref 12.0–15.0)
MCH: 33.1 pg (ref 26.0–34.0)
MCHC: 34.5 g/dL (ref 30.0–36.0)
MCV: 96 fL (ref 78.0–100.0)
Platelets: 177 10*3/uL (ref 150–400)
RBC: 2.78 MIL/uL — ABNORMAL LOW (ref 3.87–5.11)
RDW: 13.7 % (ref 11.5–15.5)
WBC: 12.2 10*3/uL — ABNORMAL HIGH (ref 4.0–10.5)

## 2017-07-08 LAB — URINALYSIS, COMPLETE (UACMP) WITH MICROSCOPIC
Bacteria, UA: NONE SEEN
Bilirubin Urine: NEGATIVE
GLUCOSE, UA: NEGATIVE mg/dL
KETONES UR: NEGATIVE mg/dL
LEUKOCYTES UA: NEGATIVE
NITRITE: NEGATIVE
PH: 5 (ref 5.0–8.0)
PROTEIN: NEGATIVE mg/dL
Specific Gravity, Urine: 1.02 (ref 1.005–1.030)
Squamous Epithelial / LPF: NONE SEEN

## 2017-07-08 LAB — AMMONIA: Ammonia: 39 umol/L — ABNORMAL HIGH (ref 9–35)

## 2017-07-08 LAB — APTT: APTT: 76 s — AB (ref 24–36)

## 2017-07-08 LAB — TSH: TSH: 4.22 u[IU]/mL (ref 0.350–4.500)

## 2017-07-08 MED ORDER — FENTANYL CITRATE (PF) 100 MCG/2ML IJ SOLN
12.5000 ug | INTRAMUSCULAR | Status: DC | PRN
  Administered 2017-07-09 (×2): 12.5 ug via INTRAVENOUS
  Filled 2017-07-08 (×2): qty 2

## 2017-07-08 MED ORDER — HEPARIN (PORCINE) IN NACL 100-0.45 UNIT/ML-% IJ SOLN
500.0000 [IU]/h | INTRAMUSCULAR | Status: DC
  Administered 2017-07-08: 700 [IU]/h via INTRAVENOUS
  Filled 2017-07-08: qty 250

## 2017-07-08 MED ORDER — METOPROLOL TARTRATE 5 MG/5ML IV SOLN
2.5000 mg | Freq: Four times a day (QID) | INTRAVENOUS | Status: DC
  Administered 2017-07-08 – 2017-07-11 (×11): 2.5 mg via INTRAVENOUS
  Filled 2017-07-08 (×11): qty 5

## 2017-07-08 MED ORDER — SORBITOL 70 % SOLN
960.0000 mL | TOPICAL_OIL | Freq: Once | ORAL | Status: AC
  Administered 2017-07-08: 960 mL via RECTAL
  Filled 2017-07-08: qty 240

## 2017-07-08 MED ORDER — METOCLOPRAMIDE HCL 5 MG/ML IJ SOLN
5.0000 mg | Freq: Four times a day (QID) | INTRAMUSCULAR | Status: AC
  Administered 2017-07-08 – 2017-07-10 (×8): 5 mg via INTRAVENOUS
  Filled 2017-07-08 (×9): qty 2

## 2017-07-08 NOTE — Progress Notes (Signed)
Patient lost IV access, unable to obtain peripheral - consult to PICC team per MD.  Meds unable to be given at this time.

## 2017-07-08 NOTE — Progress Notes (Signed)
PT Cancellation Note  Patient Details Name: Theresa Kennedy MRN: [id number] DOB: 1921/09/11   Cancelled Treatment:    Reason Eval/Treat Not Completed: Patient at procedure or test/unavailable. Chart reviewed, RN consulted. Treatment attempted, pt leaving with radiology for imaging studies. Will attempt again at later date/time as able.   2:20 PM, 07/08/17 Rosamaria Lints, PT, DPT Physical Therapist - [place] [phone] (Office)     [name] 07/08/2017, 2:19 PM

## 2017-07-08 NOTE — Progress Notes (Signed)
OT Cancellation Note  Patient Details Name: Theresa Kennedy MRN: [id number] DOB: September 01, 1921   Cancelled Treatment:    Reason Eval/Treat Not Completed: Fatigue/lethargy limiting ability to participate. Pt sleeping at this time and unable to participate in OT evaluation. Will re-attempt tomorrow if able.    Limmie Patricia, OTR/L,CBIS  [phone]  07/08/2017, 5:38 PM

## 2017-07-08 NOTE — Progress Notes (Signed)
PROGRESS NOTE  CHANCIE LAMPERT ZOX:[id number] DOB: 05/17/1921 DOA: 07/03/2017 PCP: Joette Catching, MD  Brief History:  83 y.o.femalewith medical history of stroke, essential hypertension, hyperlipidemia, and atrial fibrillation presented with a mechanical fall onto her right side while at an exercise dance class on the morning of 07/03/2017. The patient states that her feet got tangled up and resulted in a mechanical fall onto her right side. Because she had immense pain, and she was not able to get up, EMS was activated.  In the emergency department, BMP and CBC were essentially unremarkable except for a sodium of 133. Chest x-ray showed apical scarring and hyperinflation. X-ray of the right hip revealed a subcapital angulated fracture of the right femur. EKG showed atrial fibrillation with nonspecific T-wave changes. Ortho was consulted to assist in management.  The patient underwent a right hip bipolar replacement on 07/05/2017. Her postoperative course was complicated by adynamic ileus requiring NG tube placement as well as acute metabolic encephalopathy.  Assessment/Plan: Subcapital right femur fracture  -07/05/17--R-hip bipolar replacement -resumedapixaban 8/19-->switch to IV heparin due to mental status -pain control -PT/OT 07/06/17-->SNF  Abdominal pain and distension/Ileus -bladder scan--no urine -8/18-2 view abd xray-personally reviewed-Air-fluid level over the left upper quadrant;no obstruction -8/18 evening--NG placed LIS--2200cc output past 24 hours -start reglan -obtain CT abd/pelvis--small to moderate hiatus hernia. Moderate gastric distention. Mild diffuse small bowel dilatation with scattered air-fluid levels. Mild diffuse distention of the colon with gas and stool. No bowel thickening. -npo -convert essential medications to IV   AKI -due to volume depletion and hemodynamic changes -renal US -baseline creatinine 0.8-0.9 -serum creatinine hopefully reaching  plateau in next 24 hours -continue IVF with Kcl -AM BMP -UA -d/c losartan  Acute metabolic encephalopathy -multifactorial including AKI, ileus, opioids -CT brain -UA and urine culture -B12 -TSH -ABG -ammonia -change meds to IV if possible -EKG  Atrial fibrillation  -Rate controlled  -resumedapixaban 8/19 per Dr. Hennie Duos to IV heparin as unable to take po -switch to metoprolol IV  Leukocytosis -likely stress demargination -remains afebrile and hemodynamically stable -am CBC -CXR  Essential hypertension -Continue amlodipine, metoprolol succinate -d/c losartan due to AKI  Hx of stroke -occurred in 2014 -pt without difficulty in performing ADLs -07/03/2017 CT brain negative for acute findings. Reveals old right frontal lobe infarct. -Continue statin  Hyponatremia -chronic and stable -am BMP  Goal of care -discussed with nephew (HPOA)--confirms patient is DNR   Disposition Plan: SNFwhen medically stable Family Communication: niece and nephew updated on phone 8/20--Total time spent 45 minutes.  Greater than 50% spent face to face counseling and coordinating care.   Consultants: Ortho--[place]  Code Status: DNR   DVT Prophylaxis: IV heparin   Procedures: As Listed in Progress Note Above  Antibiotics: Peri-operative cefazolin       Subjective: Patient is somnolent this morning but arouses to voice. She is pleasantly confused. No reports of respiratory distress, uncontrolled pain, vomiting, diarrhea overnight. No bowel movement in last 24 hours. She follows 1 step commands, but is pleasantly confused.  Objective: Vitals:   07/07/17 0752 07/07/17 1152 07/07/17 2214 07/08/17 0630  BP: 139/80 132/78 (!) 113/52 130/76  Pulse: 89 93 93 87  Resp: 18 19 16 18   Temp: 98 F (36.7 C) 97.9 F (36.6 C) (!) 97.5 F (36.4 C) (!) 97 F (36.1 C)  TempSrc: Oral Oral Oral Oral  SpO2: 99% 97% 95% 98%  Weight:      Height:  Intake/Output Summary (Last 24 hours) at 07/08/17 1048 Last data filed at 07/08/17 0300  Gross per 24 hour  Intake          1938.33 ml  Output             1600 ml  Net           338.33 ml   Weight change:  Exam:   General:  Pt is sleepy, awakens to voice,  not in acute distress  HEENT: No icterus, No thrush, No neck mass, [place]/AT  Cardiovascular: IRRR, S1/S2, no rubs, no gallops  Respiratory: Bibasilar crackles. No wheezing. Good air movement.  Abdomen: Soft/+BS, non tender, non distended, no guarding  Extremities: No edema, No lymphangitis, No petechiae, No rashes, no synovitis   Data Reviewed: I have personally reviewed following labs and imaging studies Basic Metabolic Panel:  Recent Labs Lab 07/03/17 1218 07/04/17 0552 07/06/17 0611 07/07/17 0610 07/08/17 0614  NA 133* 132* 131* 131* 134*  K 3.8 4.1 4.1 4.4 4.0  CL 95* 94* 92* 90* 93*  CO2 27 30 32 30 28  GLUCOSE 164* 123* 160* 173* 183*  BUN 21* 21* 27* 51* 57*  CREATININE 0.86 0.72 0.77 1.19* 1.33*  CALCIUM 10.1 9.3 9.3 10.4* 9.3   Liver Function Tests:  Recent Labs Lab 07/08/17 0614  AST 68*  ALT 26  ALKPHOS 32*  BILITOT 0.9  PROT 5.2*  ALBUMIN 3.3*   No results for input(s): LIPASE, AMYLASE in the last 168 hours. No results for input(s): AMMONIA in the last 168 hours. Coagulation Profile:  Recent Labs Lab 07/03/17 1218  INR 1.08   CBC:  Recent Labs Lab 07/03/17 1218 07/04/17 0552 07/05/17 0612 07/06/17 0611 07/07/17 0610 07/08/17 0614  WBC 8.6 11.3* 10.9* 10.0 14.0* 12.2*  NEUTROABS 5.9  --   --   --   --   --   HGB 13.9 13.4 15.0 11.7* 11.0* 9.2*  HCT 40.5 38.7 44.1 33.7* 31.5* 26.7*  MCV 95.5 94.4 95.5 95.5 93.8 96.0  PLT 169 159 167 154 160 177   Cardiac Enzymes: No results for input(s): CKTOTAL, CKMB, CKMBINDEX, TROPONINI in the last 168 hours. BNP: Invalid input(s): POCBNP CBG: No results for input(s): GLUCAP in the last 168 hours. HbA1C: No results for  input(s): HGBA1C in the last 72 hours. Urine analysis:    Component Value Date/Time   COLORURINE YELLOW 11/21/2015 1337   APPEARANCEUR CLEAR 11/21/2015 1337   LABSPEC 1.010 11/21/2015 1337   PHURINE 7.5 11/21/2015 1337   GLUCOSEU NEGATIVE 11/21/2015 1337   HGBUR NEGATIVE 11/21/2015 1337   BILIRUBINUR NEGATIVE 11/21/2015 1337   KETONESUR NEGATIVE 11/21/2015 1337   PROTEINUR NEGATIVE 11/21/2015 1337   UROBILINOGEN 0.2 11/21/2014 1341   NITRITE NEGATIVE 11/21/2015 1337   LEUKOCYTESUR NEGATIVE 11/21/2015 1337   Sepsis Labs: @LABRCNTIP (procalcitonin:4,lacticidven:4) ) Recent Results (from the past 240 hour(s))  Surgical PCR screen     Status: None   Collection Time: 07/03/17  6:00 PM  Result Value Ref Range Status   MRSA, PCR NEGATIVE NEGATIVE Final   Staphylococcus aureus NEGATIVE NEGATIVE Final    Comment:        The Xpert SA Assay (FDA approved for NASAL specimens in patients over 67 years of age), is one component of a comprehensive surveillance program.  Test performance has been validated by [facility] for patients greater than or equal to 50 year old. It is not intended to diagnose infection nor to guide or monitor treatment.  Scheduled Meds: . apixaban  2.5 mg Oral BID  . carbamide peroxide  5 drop Both EARS Weekly  . docusate sodium  100 mg Oral Daily  . feeding supplement (ENSURE ENLIVE)  237 mL Oral BID BM  . mouth rinse  15 mL Mouth Rinse BID  . metoCLOPramide (REGLAN) injection  5 mg Intravenous Q6H  . metoprolol tartrate  2.5 mg Intravenous Q6H   Continuous Infusions: . dextrose 5 % and 0.9 % NaCl with KCl 20 mEq/L 100 mL/hr at 07/08/17 0406    Procedures/Studies: Ct Abdomen Pelvis Wo Contrast  Result Date: 07/07/2017 CLINICAL DATA:  Inpatient. Abdominal distention and pain. Recent mechanical fall with right hip fracture and subsequent right hip arthroplasty. History of hysterectomy. EXAM: CT ABDOMEN AND PELVIS WITHOUT CONTRAST TECHNIQUE:  Multidetector CT imaging of the abdomen and pelvis was performed following the standard protocol without IV contrast. COMPARISON:  02/12/2015 CT abdomen/ pelvis. Abdominal radiograph from earlier today. FINDINGS: Lower chest: Small dependent bilateral pleural effusions with associated mild-to-moderate compressive atelectasis in the dependent lower lobes. Mild patchy tree-in-bud opacities and ground-glass opacities in the bilateral lower lobes. Coronary atherosclerosis. Oral contrast in the lower thoracic esophagus. Hepatobiliary: Two simple liver cysts, largest 4.2 cm centrally. No additional liver lesions. Normal liver size. Normal gallbladder with no radiopaque cholelithiasis. No definite biliary ductal dilatation. Common bile duct diameter 7 mm, probably normal for age. Pancreas: Normal, with no mass or duct dilation. Spleen: Normal size. No mass. Adrenals/Urinary Tract: Normal adrenals. No renal stones. No hydronephrosis. No contour deforming renal masses. Limited visualization of the mildly distended bladder due to streak artifact from the right hip hardware, with no gross bladder abnormality. Stomach/Bowel: Small to moderate hiatal hernia. There is moderate gastric distension by oral contrast. Well-positioned enteric tube with tip in the gastric fundus. No gastric wall thickening. There is diffuse mild small bowel dilatation up to 3.5 cm diameter with scattered fluid levels in the small bowel. No small bowel wall thickening or focal small bowel caliber transition. Normal appendix. Mild diffuse distention of the colon with gas and stool. No large bowel wall thickening or pericolonic fat stranding. Vascular/Lymphatic: Atherosclerotic nonaneurysmal abdominal aorta. No pathologically enlarged lymph nodes in the abdomen or pelvis. Reproductive: Status post hysterectomy, with no abnormal findings at the vaginal cuff. No adnexal mass. Other: No pneumoperitoneum, ascites or focal fluid collection. Musculoskeletal: No  aggressive appearing focal osseous lesions. Marked multilevel thoracolumbar degenerative disc disease. Partially visualized right hip hemiarthroplasty. Skin staples are seen lateral to the right hip. Expected postoperative soft tissue gas surrounding the right hip. IMPRESSION: 1. Mild diffuse small and large bowel dilatation with scattered small bowel fluid levels, compatible with postoperative adynamic ileus. No free air. 2. Small to moderate hiatal hernia. Oral contrast in the lower thoracic esophagus, suggesting esophageal dysmotility and/or gastroesophageal reflux. 3. Mild patchy tree-in-bud and ground-glass opacities at the lung bases, suggesting mild aspiration. 4. Small dependent bilateral pleural effusions with mild dependent bibasilar atelectasis. 5.  Aortic Atherosclerosis (ICD10-I70.0).  Coronary atherosclerosis. Electronically Signed   By: Delbert Phenix M.D.   On: 07/07/2017 18:13   Dg Chest 1 View  Result Date: 07/03/2017 CLINICAL DATA:  Hip fracture EXAM: CHEST 1 VIEW COMPARISON:  11/21/2015 FINDINGS: COPD with hyperinflation. Extensive apical scarring bilaterally unchanged. Negative for heart failure or pneumonia. Negative for pleural effusion. IMPRESSION: COPD with apical scarring. No acute abnormality and unchanged from the prior study. Electronically Signed   By: Marlan Palau M.D.   On: 07/03/2017 12:01  Dg Abd 1 View  Result Date: 07/07/2017 CLINICAL DATA:  NG tube placement. EXAM: ABDOMEN - 1 VIEW COMPARISON:  July 06, 2017 FINDINGS: An NG tube terminates in the left upper quadrant. The air-fluid level in the stomach is no longer seen. An air-filled structure left upper quadrant is likely mildly distended stomach. No obstruction or definitive ileus. IMPRESSION: 1. The NG tube is in good position. The air-fluid level in the stomach seen previously is no longer visualized. The stomach may remain mildly distended. 2. No obstruction or definitive ileus. Electronically Signed   By: Gerome Sam III M.D   On: 07/07/2017 07:25   Ct Head Wo Contrast  Result Date: 07/03/2017 CLINICAL DATA:  Fall at aerobics class. EXAM: CT HEAD WITHOUT CONTRAST TECHNIQUE: Contiguous axial images were obtained from the base of the skull through the vertex without intravenous contrast. COMPARISON:  11/21/2015 FINDINGS: Brain: No evidence of acute infarction, hemorrhage, hydrocephalus, extra-axial collection or mass lesion/mass effect. Chronic right frontal lobe encephalomalacia identified There is mild diffuse low-attenuation within the subcortical and periventricular white matter compatible with chronic microvascular disease. Prominence of the sulci and ventricles noted. Vascular: No hyperdense vessel or unexpected calcification. Skull: Normal. Negative for fracture or focal lesion. Sinuses/Orbits: No acute finding. Other: None. IMPRESSION: 1. No acute intracranial abnormality. 2. Chronic small vessel ischemic disease and brain atrophy. 3. Chronic right frontal lobe infarct. Electronically Signed   By: Signa Kell M.D.   On: 07/03/2017 20:29   Pelvis Portable  Result Date: 07/05/2017 CLINICAL DATA:  Postop hip fracture EXAM: PORTABLE PELVIS 1-2 VIEWS COMPARISON:  07/03/2017 FINDINGS: Post right hip replacement. Normal AP alignment. No hardware or bony complicating feature. Mild degenerative changes in the left hip joint. IMPRESSION: Right hip replacement.  No complicating feature. Electronically Signed   By: Charlett Nose M.D.   On: 07/05/2017 10:42   Dg Abd 2 Views  Result Date: 07/06/2017 CLINICAL DATA:  Abdominal distention. EXAM: ABDOMEN - 2 VIEW COMPARISON:  February 14, 2015 FINDINGS: There is an air-fluid level in the left upper quadrant. Statistically, this is most likely to be within a distended stomach. No air under the right hemidiaphragm. Probable hiatal hernia. Lung bases otherwise normal. Rounded density over the left mid abdomen. No bowel obstruction. No other acute abnormalities. Scoliotic  and degenerative changes of the spine. IMPRESSION: 1. Air-fluid level over the left upper quadrant is statistically most likely to represent a stomach distended with gas and fluid. No free air as right hemidiaphragm. 2. There is a rounded density over the left mid abdomen on one view which is nonspecific. This could be artifactual or something on the patient. This was not seen previously. Recommend attention on follow-up. 3. No other acute abnormalities. Electronically Signed   By: Gerome Sam III M.D   On: 07/06/2017 13:35   Dg Hip Unilat With Pelvis 2-3 Views Right  Result Date: 07/03/2017 CLINICAL DATA:  Fall.  Right hip pain EXAM: DG HIP (WITH OR WITHOUT PELVIS) 2-3V RIGHT COMPARISON:  None. FINDINGS: Right femoral neck fracture with angulation and impaction. Fracture is in the subcapital region of the femoral neck. Calcification of the glenoid labrum. Joint space normal. No other pelvic fracture. Advanced disc degeneration L5-S1 IMPRESSION: Subcapital angulated fracture of the right femur. Electronically Signed   By: Marlan Palau M.D.   On: 07/03/2017 12:03    Jeree Delcid, DO  [facility] Pager [phone]  If 7PM-7AM, please contact night-coverage [email or web address] Password [email or web address] 07/08/2017, 10:48 AM  LOS: 5 days

## 2017-07-08 NOTE — Progress Notes (Signed)
ANTICOAGULATION CONSULT NOTE - Initial Consult  Pharmacy Consult for Heparin Indication: atrial fibrillation  Allergies  Allergen Reactions  . Sulfa Antibiotics Other (See Comments)    unknown   Patient Measurements: Height: 5\' 3"  (160 cm) Weight: 106 lb 12.8 oz (48.4 kg) IBW/kg (Calculated) : 52.4 HEPARIN DW (KG): 48.4  Vital Signs: Temp: 97 F (36.1 C) (08/20 0630) Temp Source: Oral (08/20 0630) BP: 130/76 (08/20 0630) Pulse Rate: 87 (08/20 0630)  Labs:  Recent Labs  07/06/17 0611 07/07/17 0610 07/08/17 0614  HGB 11.7* 11.0* 9.2*  HCT 33.7* 31.5* 26.7*  PLT 154 160 177  CREATININE 0.77 1.19* 1.33*    Estimated Creatinine Clearance: 20.2 mL/min (A) (by C-G formula based on SCr of 1.33 mg/dL (H)).   Medical History: Past Medical History:  Diagnosis Date  . Atrial fibrillation (HCC)   . GERD (gastroesophageal reflux disease)   . Hypertension   . Stroke [facility])     Medications:  Prescriptions Prior to Admission  Medication Sig Dispense Refill Last Dose  . amLODipine (NORVASC) 10 MG tablet Take 10 mg by mouth daily.    07/03/2017 at 0800  . apixaban (ELIQUIS) 2.5 MG TABS tablet Take 1 tablet (2.5 mg total) by mouth 2 (two) times daily. 120 tablet 0 07/03/2017 at 0800  . atorvastatin (LIPITOR) 10 MG tablet Take 10 mg by mouth daily at 6 PM.    07/02/2017 at Unknown time  . Calcium Carbonate-Vitamin D 600-200 MG-UNIT TABS Take 1 tablet by mouth daily.   07/03/2017 at 0800  . carbamide peroxide (DEBROX) 6.5 % otic solution Place 5 drops into both ears 2 (two) times daily. (Patient taking differently: Place 5 drops into both ears once a week. USE ONCE A WEEK) 15 mL 0 Past Week at Unknown time  . diphenhydrAMINE (BENADRYL) 25 MG tablet Take 25 mg by mouth every 6 (six) hours as needed for itching.   Past Week at Unknown time  . diphenhydrAMINE-zinc acetate (BENADRYL) cream Apply 1 application topically 2 (two) times daily as needed for itching.   Past Week at Unknown time   . docusate sodium (COLACE) 100 MG capsule Take 1 capsule (100 mg total) by mouth daily. 30 capsule 1 07/03/2017 at 0800  . Grape Seed Extract 100 MG CAPS Take 100 mg by mouth daily.    07/03/2017 at 0800  . losartan (COZAAR) 25 MG tablet Take 25 mg by mouth every evening.    07/02/2017 at Unknown time  . metoprolol tartrate (LOPRESSOR) 25 MG tablet Take 25 mg by mouth 2 (two) times daily.   07/03/2017 at 0800  . Multiple Vitamin (MULTIVITAMIN WITH MINERALS) TABS tablet Take 1 tablet by mouth daily.   07/03/2017 at Unknown time  . Omega-3 Fatty Acids (FISH OIL) 1000 MG CAPS Take 1 capsule by mouth 2 (two) times daily.    07/03/2017 at Unknown time  . psyllium (METAMUCIL) 58.6 % packet Take 1 packet by mouth daily.   07/03/2017 at Unknown time  . ranitidine (ZANTAC) 150 MG capsule Take 150 mg by mouth 2 (two) times daily.    07/03/2017 at Unknown time   Brief History:  83 y.o.femalewith medical history of stroke, essential hypertension, hyperlipidemia, and atrial fibrillation presented with a mechanical fall onto her right side while at an exercise dance class on the morning of 07/03/2017. The patient states that her feet got tangled up and resulted in a mechanical fall onto her right side. Because she had immense pain, and she was not  able to get up, EMS was activated.  In the emergency department, BMP and CBC were essentially unremarkable except for a sodium of 133. Chest x-ray showed apical scarring and hyperinflation. X-ray of the right hip revealed a subcapital angulated fracture of the right femur. EKG showed atrial fibrillation with nonspecific T-wave changes. Ortho was consulted to assist in management.  The patient underwent a right hip bipolar replacement on 07/05/2017. Her postoperative course was complicated by adynamic ileus requiring NG tube placement as well as acute metabolic encephalopathy.  Last Eliquis dose was 8/19 at 22:30, will use aPTT to manage Heparin rate.    Goal of Therapy:  APTT  63-105 Monitor platelets by anticoagulation protocol: Yes   Plan:  Heparin 700 units/hr Check aPTT in 6-8 hrs and daily CBC daily while on Heparin  Margo Aye, Celsa Nordahl A 07/08/2017,11:55 AM

## 2017-07-09 ENCOUNTER — Encounter (HOSPITAL_COMMUNITY): Payer: Self-pay | Admitting: Orthopedic Surgery

## 2017-07-09 ENCOUNTER — Inpatient Hospital Stay (HOSPITAL_COMMUNITY): Payer: Medicare HMO

## 2017-07-09 DIAGNOSIS — K9189 Other postprocedural complications and disorders of digestive system: Secondary | ICD-10-CM

## 2017-07-09 DIAGNOSIS — K567 Ileus, unspecified: Secondary | ICD-10-CM

## 2017-07-09 LAB — COMPREHENSIVE METABOLIC PANEL
ALBUMIN: 3 g/dL — AB (ref 3.5–5.0)
ALK PHOS: 27 U/L — AB (ref 38–126)
ALT: 22 U/L (ref 14–54)
ANION GAP: 5 (ref 5–15)
AST: 47 U/L — ABNORMAL HIGH (ref 15–41)
BUN: 52 mg/dL — ABNORMAL HIGH (ref 6–20)
CHLORIDE: 101 mmol/L (ref 101–111)
CO2: 32 mmol/L (ref 22–32)
CREATININE: 1.04 mg/dL — AB (ref 0.44–1.00)
Calcium: 8.7 mg/dL — ABNORMAL LOW (ref 8.9–10.3)
GFR calc non Af Amer: 45 mL/min — ABNORMAL LOW (ref >60)
GFR, EST AFRICAN AMERICAN: 52 mL/min — AB (ref >60)
GLUCOSE: 131 mg/dL — AB (ref 65–99)
Potassium: 4.2 mmol/L (ref 3.5–5.1)
SODIUM: 138 mmol/L (ref 135–145)
Total Bilirubin: 0.9 mg/dL (ref 0.3–1.2)
Total Protein: 4.7 g/dL — ABNORMAL LOW (ref 6.5–8.1)

## 2017-07-09 LAB — CBC
HCT: 22 % — ABNORMAL LOW (ref 36.0–46.0)
HEMOGLOBIN: 7.4 g/dL — AB (ref 12.0–15.0)
MCH: 32.7 pg (ref 26.0–34.0)
MCHC: 33.6 g/dL (ref 30.0–36.0)
MCV: 97.3 fL (ref 78.0–100.0)
PLATELETS: 123 10*3/uL — AB (ref 150–400)
RBC: 2.26 MIL/uL — AB (ref 3.87–5.11)
RDW: 14 % (ref 11.5–15.5)
WBC: 8.4 10*3/uL (ref 4.0–10.5)

## 2017-07-09 LAB — APTT
APTT: 75 s — AB (ref 24–36)
aPTT: 138 seconds — ABNORMAL HIGH (ref 24–36)

## 2017-07-09 LAB — T4, FREE: FREE T4: 1.31 ng/dL — AB (ref 0.61–1.12)

## 2017-07-09 LAB — AMMONIA: AMMONIA: 14 umol/L (ref 9–35)

## 2017-07-09 LAB — VITAMIN B12: VITAMIN B 12: 702 pg/mL (ref 180–914)

## 2017-07-09 NOTE — Progress Notes (Signed)
Physical Therapy Treatment Patient Details Name: Theresa Kennedy MRN: [id number] DOB: 1921/05/09 Today's Date: 07/09/2017    History of Present Illness Theresa Kennedy is a 83 y.o. female with medical history of stroke, essential hypertension, hyperlipidemia, and atrial fibrillation presented with a mechanical fall onto her right side while at an exercise dance class on the morning of 07/03/2017. The patient states that her feet got tangled up and resulted in a mechanical fall onto her right side. Because she had immense pain, and she was not able to get up, EMS was activated. Patient denies fevers, chills, headache, chest pain, dyspnea, nausea, vomiting, diarrhea, abdominal pain, dysuria, hematuria, hematochezia, and melena.    PT Comments    Patient demonstrates improved functional mobility and able to take a few side steps, limited secondary to c/o fatigue and irritation in mouth due to wanting water.  Activity also limited due to low HGB.  Follow Up Recommendations  SNF     Equipment Recommendations  None recommended by PT    Recommendations for Other Services       Precautions / Restrictions Precautions Precautions: Fall Restrictions Weight Bearing Restrictions: Yes RLE Weight Bearing: Weight bearing as tolerated Other Position/Activity Restrictions: hip precautions for direct lateral THA    Mobility  Bed Mobility Overal bed mobility: Needs Assistance Bed Mobility: Sit to Supine Rolling: Mod assist   Supine to sit: Mod assist Sit to supine: Mod assist   General bed mobility comments: Patient demonstrates good return for use BUE to sit up at bedside with assistance to move RLE  Transfers Overall transfer level: Needs assistance Equipment used: Rolling walker (2 wheeled) Transfers: Sit to/from Stand Sit to Stand: Mod assist         General transfer comment: Patient tolerate standing with FWW for 30-40 seconds  Ambulation/Gait Ambulation/Gait assistance: Max  assist Ambulation Distance (Feet): 3 Feet Assistive device: Rolling walker (2 wheeled)       General Gait Details: limited to a few side steps   Stairs            Wheelchair Mobility    Modified Rankin (Stroke Patients Only)       Balance Overall balance assessment: Needs assistance Sitting-balance support: Feet supported Sitting balance-Leahy Scale: Fair     Standing balance support: Bilateral upper extremity supported Standing balance-Leahy Scale: Poor                              Cognition Arousal/Alertness: Awake/alert Behavior During Therapy: WFL for tasks assessed/performed Overall Cognitive Status: Within Functional Limits for tasks assessed                                        Exercises Total Joint Exercises Ankle Circles/Pumps: Both;10 reps Quad Sets: Both;10 reps Short Arc Quad: Both;10 reps Heel Slides: Both;10 reps Hip ABduction/ADduction: Both;10 reps Goniometric ROM: 0-90 degrees bilatheral hips    General Comments        Pertinent Vitals/Pain Pain Assessment: 0-10 Pain Score: 7  Pain Location: mouth, tongue Pain Descriptors / Indicators: Aching Pain Intervention(s): Monitored during session;Limited activity within patient's tolerance    Home Living                      Prior Function            PT Goals (  current goals can now be found in the care plan section) Acute Rehab PT Goals Patient Stated Goal: return home PT Goal Formulation: With patient/family Time For Goal Achievement: 07/19/17 Potential to Achieve Goals: Good Progress towards PT goals: Progressing toward goals    Frequency    Min 3X/week      PT Plan Current plan remains appropriate    Co-evaluation              AM-PAC PT "6 Clicks" Daily Activity  Outcome Measure                   End of Session Equipment Utilized During Treatment: Gait belt Activity Tolerance: Patient limited by fatigue Patient  left: in bed;with call bell/phone within reach;with family/visitor present Nurse Communication: Mobility status PT Visit Diagnosis: Unsteadiness on feet (R26.81);Other abnormalities of gait and mobility (R26.89);Muscle weakness (generalized) (M62.81)     Time: 0165-5374 PT Time Calculation (min) (ACUTE ONLY): 31 min  Charges:  $Therapeutic Exercise: 8-22 mins $Therapeutic Activity: 8-22 mins                    G Codes:       2:57 PM, July 26, 2017 Ocie Bob, MPT Physical Therapist with [facility] [phone] office [phone] mobile phone

## 2017-07-09 NOTE — NC FL2 (Signed)
[place] MEDICAID FL2 LEVEL OF CARE SCREENING TOOL     IDENTIFICATION  Patient Name: Theresa Kennedy Birthdate: [birth year] Sex: female Admission Date (Current Location): 07/03/2017  [facility] and [place] Number:  [employer] and Address:  [facility],  [address], [place] [address]      Provider Number: [id number]  Attending Physician Name and Address:  Catarina Hartshorn, MD  Relative Name and Phone Number:       Current Level of Care:   Recommended Level of Care:   Prior Approval Number:    Date Approved/Denied:   PASRR Number: [id number]  Discharge Plan: SNF    Current Diagnoses: Patient Active Problem List   Diagnosis Date Noted  . Acute metabolic encephalopathy 07/08/2017  . Adynamic ileus (HCC) 07/07/2017  . AKI (acute kidney injury) (HCC) 07/07/2017  . Hip fracture (HCC)   . Abdominal distension   . Other fracture of right femur, initial encounter for closed fracture (HCC) 07/03/2017  . Atrial fibrillation (HCC) 07/03/2017  . Essential hypertension 07/03/2017  . Closed subcapital fracture of right femur (HCC)   . Stroke (cerebrum) (HCC) 11/20/2015  . TIA (transient ischemic attack) 11/20/2015  . Abdominal pain 02/12/2015  . HLD (hyperlipidemia) 02/12/2015  . GERD (gastroesophageal reflux disease) 02/12/2015  . Hyponatremia 02/12/2015  . Nausea & vomiting   . Ileus, postoperative (HCC)   . right frontal infarct secondary to afib 04/21/2013  . New onset a-fib (HCC) 04/21/2013  . Atrial fibrillation, persistent (HCC) 04/21/2013  . Slurred speech 04/21/2013  . Acute respiratory failure with hypoxia (HCC) 04/21/2013  . Aphasia 04/20/2013  . Hyperglycemia 04/20/2013  . HTN (hypertension) 04/20/2013    Orientation RESPIRATION BLADDER Height & Weight     Self, Situation, Place  Normal Continent Weight: 106 lb 12.8 oz (48.4 kg) Height:  5\' 3"  (160 cm)  BEHAVIORAL SYMPTOMS/MOOD NEUROLOGICAL BOWEL NUTRITION STATUS      Continent Diet  (see discharge summary )  AMBULATORY STATUS COMMUNICATION OF NEEDS Skin   Limited Assist Verbally Surgical wounds (Hip)                       Personal Care Assistance Level of Assistance  Bathing, Feeding, Dressing Bathing Assistance: Limited assistance Feeding assistance: Independent Dressing Assistance: Limited assistance     Functional Limitations Info  Sight, Hearing, Speech Sight Info: Adequate Hearing Info: Adequate Speech Info: Adequate    SPECIAL CARE FACTORS FREQUENCY  PT (By licensed PT), OT (By licensed OT)                    Contractures Contractures Info: Not present    Additional Factors Info  Code Status, Allergies Code Status Info: DNR Allergies Info: Sulfa Antibiotics           Current Medications (07/09/2017):  This is the current hospital active medication list Current Facility-Administered Medications  Medication Dose Route Frequency Provider Last Rate Last Dose  . acetaminophen (TYLENOL) tablet 650 mg  650 mg Oral Q6H PRN Vickki Hearing, MD       Or  . acetaminophen (TYLENOL) suppository 650 mg  650 mg Rectal Q6H PRN Vickki Hearing, MD      . carbamide peroxide (DEBROX) 6.5 % OTIC (EAR) solution 5 drop  5 drop Both EARS Weekly Tat, David, MD   5 drop at 07/07/17 0022  . dextrose 5 % and 0.9 % NaCl with KCl 20 mEq/L infusion   Intravenous Continuous  Catarina Hartshorn, MD 75 mL/hr at 07/09/17 [phone]    . feeding supplement (ENSURE ENLIVE) (ENSURE ENLIVE) liquid 237 mL  237 mL Oral BID BM Catarina Hartshorn, MD   237 mL at 07/05/17 1514  . fentaNYL (SUBLIMAZE) injection 12.5 mcg  12.5 mcg Intravenous Q2H PRN Tat, David, MD   12.5 mcg at 07/09/17 0815  . heparin ADULT infusion 100 units/mL (25000 units/243mL sodium chloride 0.45%)  500 Units/hr Intravenous Continuous Tat, David, MD 5 mL/hr at 07/09/17 0933 500 Units/hr at 07/09/17 0933  . HYDROcodone-acetaminophen (NORCO/VICODIN) 5-325 MG per tablet 1-2 tablet  1-2 tablet Oral Q6H PRN Catarina Hartshorn, MD    2 tablet at 07/06/17 0601  . MEDLINE mouth rinse  15 mL Mouth Rinse BID Tat, David, MD   15 mL at 07/08/17 1000  . menthol-cetylpyridinium (CEPACOL) lozenge 3 mg  1 lozenge Oral PRN Vickki Hearing, MD       Or  . phenol (CHLORASEPTIC) mouth spray 1 spray  1 spray Mouth/Throat PRN Vickki Hearing, MD      . metoCLOPramide (REGLAN) injection 5 mg  5 mg Intravenous Q6H Catarina Hartshorn, MD   5 mg at 07/09/17 1155  . metoprolol tartrate (LOPRESSOR) injection 2.5 mg  2.5 mg Intravenous Q6H Catarina Hartshorn, MD   2.5 mg at 07/09/17 1156  . ondansetron (ZOFRAN) injection 4 mg  4 mg Intravenous Q6H PRN Catarina Hartshorn, MD   4 mg at 07/06/17 0602  . polyethylene glycol (MIRALAX / GLYCOLAX) packet 17 g  17 g Oral Daily PRN Catarina Hartshorn, MD   17 g at 07/06/17 9604     Discharge Medications: Please see discharge summary for a list of discharge medications.  Relevant Imaging Results:  Relevant Lab Results:   Additional Information SSN [id number] Juleen China, LCSW

## 2017-07-09 NOTE — Care Management Note (Signed)
Case Management Note  Patient Details  Name: Theresa Kennedy MRN: [id number] Date of Birth: [birth year]   If discussed at Long Length of Stay Meetings, dates discussed:  07/09/2017  Additional Comments:  Jacyln Carmer, Chrystine Oiler, RN 07/09/2017, 12:12 PM

## 2017-07-09 NOTE — Progress Notes (Signed)
Per protocol, put in an order for a portable chest x-ray to verify NG tube placement. Currently is hooked up to Low- Intermittent suction, yet isn't pulling much off. Want a verification of placement. Will continue to monitor.

## 2017-07-09 NOTE — Progress Notes (Signed)
ANTICOAGULATION CONSULT NOTE   Pharmacy Consult for Heparin Indication: atrial fibrillation  Allergies  Allergen Reactions  . Sulfa Antibiotics Other (See Comments)    unknown   Patient Measurements: Height: 5\' 3"  (160 cm) Weight: 106 lb 12.8 oz (48.4 kg) IBW/kg (Calculated) : 52.4 HEPARIN DW (KG): 48.4  Vital Signs: Temp: 98.5 F (36.9 C) (08/20 2128) Temp Source: Axillary (08/20 2128) BP: 118/60 (08/20 2128) Pulse Rate: 88 (08/20 2128)  Labs:  Recent Labs  07/07/17 0610 07/08/17 0614 07/08/17 2036 07/09/17 0602  HGB 11.0* 9.2*  --  7.4*  HCT 31.5* 26.7*  --  22.0*  PLT 160 177  --  123*  APTT  --   --  76*  --   CREATININE 1.19* 1.33*  --  1.04*   Estimated Creatinine Clearance: 25.8 mL/min (A) (by C-G formula based on SCr of 1.04 mg/dL (H)).  Medical History: Past Medical History:  Diagnosis Date  . Atrial fibrillation (HCC)   . GERD (gastroesophageal reflux disease)   . Hypertension   . Stroke [facility])    Medications:  Prescriptions Prior to Admission  Medication Sig Dispense Refill Last Dose  . amLODipine (NORVASC) 10 MG tablet Take 10 mg by mouth daily.    07/03/2017 at 0800  . apixaban (ELIQUIS) 2.5 MG TABS tablet Take 1 tablet (2.5 mg total) by mouth 2 (two) times daily. 120 tablet 0 07/03/2017 at 0800  . atorvastatin (LIPITOR) 10 MG tablet Take 10 mg by mouth daily at 6 PM.    07/02/2017 at Unknown time  . Calcium Carbonate-Vitamin D 600-200 MG-UNIT TABS Take 1 tablet by mouth daily.   07/03/2017 at 0800  . carbamide peroxide (DEBROX) 6.5 % otic solution Place 5 drops into both ears 2 (two) times daily. (Patient taking differently: Place 5 drops into both ears once a week. USE ONCE A WEEK) 15 mL 0 Past Week at Unknown time  . diphenhydrAMINE (BENADRYL) 25 MG tablet Take 25 mg by mouth every 6 (six) hours as needed for itching.   Past Week at Unknown time  . diphenhydrAMINE-zinc acetate (BENADRYL) cream Apply 1 application topically 2 (two) times daily as  needed for itching.   Past Week at Unknown time  . docusate sodium (COLACE) 100 MG capsule Take 1 capsule (100 mg total) by mouth daily. 30 capsule 1 07/03/2017 at 0800  . Grape Seed Extract 100 MG CAPS Take 100 mg by mouth daily.    07/03/2017 at 0800  . losartan (COZAAR) 25 MG tablet Take 25 mg by mouth every evening.    07/02/2017 at Unknown time  . metoprolol tartrate (LOPRESSOR) 25 MG tablet Take 25 mg by mouth 2 (two) times daily.   07/03/2017 at 0800  . Multiple Vitamin (MULTIVITAMIN WITH MINERALS) TABS tablet Take 1 tablet by mouth daily.   07/03/2017 at Unknown time  . Omega-3 Fatty Acids (FISH OIL) 1000 MG CAPS Take 1 capsule by mouth 2 (two) times daily.    07/03/2017 at Unknown time  . psyllium (METAMUCIL) 58.6 % packet Take 1 packet by mouth daily.   07/03/2017 at Unknown time  . ranitidine (ZANTAC) 150 MG capsule Take 150 mg by mouth 2 (two) times daily.    07/03/2017 at Unknown time   Brief History:  83 y.o.femalewith medical history of stroke, essential hypertension, hyperlipidemia, and atrial fibrillation presented with a mechanical fall onto her right side while at an exercise dance class on the morning of 07/03/2017. The patient states that her feet got  tangled up and resulted in a mechanical fall onto her right side. Because she had immense pain, and she was not able to get up, EMS was activated.  In the emergency department, BMP and CBC were essentially unremarkable except for a sodium of 133. Chest x-ray showed apical scarring and hyperinflation. X-ray of the right hip revealed a subcapital angulated fracture of the right femur. EKG showed atrial fibrillation with nonspecific T-wave changes. Ortho was consulted to assist in management.  The patient underwent a right hip bipolar replacement on 07/05/2017. Her postoperative course was complicated by adynamic ileus requiring NG tube placement as well as acute metabolic encephalopathy.  Last Eliquis dose was 8/19 at 22:30, will use aPTT to  manage Heparin rate.  H/H has dropped, no bleeding reported, aPTT is therapeutic.    Goal of Therapy:  APTT 63-105 Monitor platelets by anticoagulation protocol: Yes   Plan:  Continue Heparin 700 units/hr Check aPTT daily CBC daily while on Heparin  Nefi Musich A 07/09/2017,8:15 AM

## 2017-07-09 NOTE — Progress Notes (Signed)
ANTICOAGULATION CONSULT NOTE   Pharmacy Consult for Heparin Indication: atrial fibrillation  Allergies  Allergen Reactions  . Sulfa Antibiotics Other (See Comments)    unknown   Patient Measurements: Height: 5\' 3"  (160 cm) Weight: 106 lb 12.8 oz (48.4 kg) IBW/kg (Calculated) : 52.4 HEPARIN DW (KG): 48.4  Vital Signs:    Labs:  Recent Labs  07/07/17 0610 07/08/17 0614 07/08/17 2036 07/09/17 0602  HGB 11.0* 9.2*  --  7.4*  HCT 31.5* 26.7*  --  22.0*  PLT 160 177  --  123*  APTT  --   --  76* 138*  CREATININE 1.19* 1.33*  --  1.04*   Estimated Creatinine Clearance: 25.8 mL/min (A) (by C-G formula based on SCr of 1.04 mg/dL (H)).  Medical History: Past Medical History:  Diagnosis Date  . Atrial fibrillation (HCC)   . GERD (gastroesophageal reflux disease)   . Hypertension   . Stroke [facility])    Medications:  Prescriptions Prior to Admission  Medication Sig Dispense Refill Last Dose  . amLODipine (NORVASC) 10 MG tablet Take 10 mg by mouth daily.    07/03/2017 at 0800  . apixaban (ELIQUIS) 2.5 MG TABS tablet Take 1 tablet (2.5 mg total) by mouth 2 (two) times daily. 120 tablet 0 07/03/2017 at 0800  . atorvastatin (LIPITOR) 10 MG tablet Take 10 mg by mouth daily at 6 PM.    07/02/2017 at Unknown time  . Calcium Carbonate-Vitamin D 600-200 MG-UNIT TABS Take 1 tablet by mouth daily.   07/03/2017 at 0800  . carbamide peroxide (DEBROX) 6.5 % otic solution Place 5 drops into both ears 2 (two) times daily. (Patient taking differently: Place 5 drops into both ears once a week. USE ONCE A WEEK) 15 mL 0 Past Week at Unknown time  . diphenhydrAMINE (BENADRYL) 25 MG tablet Take 25 mg by mouth every 6 (six) hours as needed for itching.   Past Week at Unknown time  . diphenhydrAMINE-zinc acetate (BENADRYL) cream Apply 1 application topically 2 (two) times daily as needed for itching.   Past Week at Unknown time  . docusate sodium (COLACE) 100 MG capsule Take 1 capsule (100 mg total) by  mouth daily. 30 capsule 1 07/03/2017 at 0800  . Grape Seed Extract 100 MG CAPS Take 100 mg by mouth daily.    07/03/2017 at 0800  . losartan (COZAAR) 25 MG tablet Take 25 mg by mouth every evening.    07/02/2017 at Unknown time  . metoprolol tartrate (LOPRESSOR) 25 MG tablet Take 25 mg by mouth 2 (two) times daily.   07/03/2017 at 0800  . Multiple Vitamin (MULTIVITAMIN WITH MINERALS) TABS tablet Take 1 tablet by mouth daily.   07/03/2017 at Unknown time  . Omega-3 Fatty Acids (FISH OIL) 1000 MG CAPS Take 1 capsule by mouth 2 (two) times daily.    07/03/2017 at Unknown time  . psyllium (METAMUCIL) 58.6 % packet Take 1 packet by mouth daily.   07/03/2017 at Unknown time  . ranitidine (ZANTAC) 150 MG capsule Take 150 mg by mouth 2 (two) times daily.    07/03/2017 at Unknown time   Brief History:  83 y.o.femalewith medical history of stroke, essential hypertension, hyperlipidemia, and atrial fibrillation presented with a mechanical fall onto her right side while at an exercise dance class on the morning of 07/03/2017. The patient states that her feet got tangled up and resulted in a mechanical fall onto her right side. Because she had immense pain, and she was not  able to get up, EMS was activated.  In the emergency department, BMP and CBC were essentially unremarkable except for a sodium of 133. Chest x-ray showed apical scarring and hyperinflation. X-ray of the right hip revealed a subcapital angulated fracture of the right femur. EKG showed atrial fibrillation with nonspecific T-wave changes. Ortho was consulted to assist in management.  The patient underwent a right hip bipolar replacement on 07/05/2017. Her postoperative course was complicated by adynamic ileus requiring NG tube placement as well as acute metabolic encephalopathy.  Last Eliquis dose was 8/19 at 22:30, will use aPTT to manage Heparin rate.  H/H has dropped, no bleeding reported, aPTT initially therapeutic but has trended up to > 130  Goal of  Therapy:  APTT 63-105 Monitor platelets by anticoagulation protocol: Yes   Plan:  Decrease Heparin to 500 units/hr Check aPTT in 6 hours and daily CBC daily while on Heparin  Margo Aye, Nickoles Gregori A 07/09/2017,9:33 AM

## 2017-07-09 NOTE — Progress Notes (Signed)
Reassessment of pt per RN and MD. MD states to leave NG tube clamped overnight and continue to monitor.

## 2017-07-09 NOTE — Progress Notes (Addendum)
PROGRESS NOTE  Theresa Kennedy GNF:[id number] DOB: 25-Jan-1921 DOA: 07/03/2017 PCP: Joette Catching, MD  Brief History:  83 y.o.femalewith medical history of stroke, essential hypertension, hyperlipidemia, and atrial fibrillation presented with a mechanical fall onto her right side while at an exercise dance class on the morning of 07/03/2017. The patient states that her feet got tangled up and resulted in a mechanical fall onto her right side. Because she had immense pain, and she was not able to get up, EMS was activated.  In the emergency department, BMP and CBC were essentially unremarkable except for a sodium of 133. Chest x-ray showed apical scarring and hyperinflation. X-ray of the right hip revealed a subcapital angulated fracture of the right femur. EKG showed atrial fibrillation with nonspecific T-wave changes. Ortho was consulted to assist in management.  The patient underwent a right hip bipolar replacement on 07/05/2017. Her postoperative course was complicated by adynamic ileus requiring NG tube placement as well as acute metabolic encephalopathy.  Assessment/Plan: Subcapital right femur fracture  -07/05/17--R-hip bipolar replacement -resumedapixaban 8/19-->switch to IV heparin due to mental status -pain control -PT/OT 07/06/17-->SNF  Abdominal pain and distension/Ileus -bladder scan--no urine -obtain CT abd/pelvis--small to moderate hiatus hernia. Moderate gastric distention. Mild diffuse small bowel dilatation with scattered air-fluid levels. Mild diffuse distention of the colon with gas and stool. No bowel thickening. -start reglan-->NG outpt improved -8/21--clamp NG--remain NPO -start clear liquids 8/22 if no abd distension next 24 hours -convert essential medications to IV  -now passing gas, no BM 8/20 with SMOG enema  AKI -due to volume depletion and hemodynamic changes -renal US--no hydronephrosis -baseline creatinine 0.8-0.9 -serum creatinine peaked  1.33 -continue IVF with Kcl -AM BMP -UA--no pyuria, no proteinuria -d/c losartan  Acute metabolic encephalopathy -multifactorial including AKI, ileus, opioids -CT brain--neg for acute findings -UA --no pyuria -B12--702 -TSH--4.220 -ABG--7.483/42/54/31 on RA-->start 2L -ammonia--14 -change meds to IV if possible -personally reviewed EKG--aflutter , nonspecific ST-T changes  -8/21--improving, near baseline per family at bedside  Acute respiratory failure with hypoxia -Likely due to hypoventilation -Personally reviewed chest x-ray--bibasilar atelectasis; ? Left effusion -place on 2 L nasal cannula -Incentive spirometry  Atrial fibrillation  -Rate controlled  -resumedapixaban 8/19per Dr. Hennie Duos to IV heparin as unable to take po -switch to metoprolol IV  Acute urine retention -foley placed 8/20 -voiding trial prior to d/c  Leukocytosis -likely stress demargination -remains afebrile and hemodynamically stable -am CBC -CXR--personally reviewed--bibasilar atelectasis ??left effusion  Acute blood loss anemia -due to hip fracture, surgery, dilution -am CBC -FOBT  Essential hypertension -Continue amlodipine, metoprolol succinate-->IV lopressor until able to tolerate po -d/c losartan due to AKI  Hx of stroke -occurred in 2014 -pt without difficulty in performing ADLs -07/03/2017 CT brain negative for acute findings. Reveals old right frontal lobe infarct. -Continue statin  Hyponatremia -chronic and stable -am BMP  Goal of care -discussed with nephew (HPOA)--confirms patient is DNR   Disposition Plan: SNFwhen medically stable Family Communication: niece updated 8/21--Total time spent 35 minutes.  Greater than 50% spent face to face counseling and coordinating care.    Consultants: Ortho--[place]  Code Status: DNR   DVT Prophylaxis: IV heparin   Procedures: As Listed in Progress Note  Above  Antibiotics: Peri-operative cefazolin     Subjective: Patient denies fevers, chills, headache, chest pain, dyspnea, nausea, vomiting, diarrhea, abdominal pain, dysuria, hematuria, hematochezia, and melena.   Objective: Vitals:   07/08/17 1237 07/08/17 1810 07/08/17 2128 07/09/17 1155  BP:  (!) 127/54 118/60 (!) 110/45  Pulse:  83 88 73  Resp:  18 18   Temp:  97.9 F (36.6 C) 98.5 F (36.9 C)   TempSrc:  Axillary Axillary   SpO2: 97% 93% 94%   Weight:      Height:        Intake/Output Summary (Last 24 hours) at 07/09/17 1617 Last data filed at 07/09/17 1500  Gross per 24 hour  Intake           3698.9 ml  Output                0 ml  Net           3698.9 ml   Weight change:  Exam:   General:  Pt is alert, follows commands appropriately, not in acute distress  HEENT: No icterus, No thrush, No neck mass, [place]/AT  Cardiovascular: IRRR, S1/S2, no rubs, no gallops  Respiratory: Bibasilar crackles. No wheezing. Good air movement.  Abdomen: Soft/+BS, non tender, non distended, no guarding  Extremities: No edema, No lymphangitis, No petechiae, No rashes, no synovitis   Data Reviewed: I have personally reviewed following labs and imaging studies Basic Metabolic Panel:  Recent Labs Lab 07/04/17 0552 07/06/17 0611 07/07/17 0610 07/08/17 0614 07/09/17 0602  NA 132* 131* 131* 134* 138  K 4.1 4.1 4.4 4.0 4.2  CL 94* 92* 90* 93* 101  CO2 30 32 30 28 32  GLUCOSE 123* 160* 173* 183* 131*  BUN 21* 27* 51* 57* 52*  CREATININE 0.72 0.77 1.19* 1.33* 1.04*  CALCIUM 9.3 9.3 10.4* 9.3 8.7*   Liver Function Tests:  Recent Labs Lab 07/08/17 0614 07/09/17 0602  AST 68* 47*  ALT 26 22  ALKPHOS 32* 27*  BILITOT 0.9 0.9  PROT 5.2* 4.7*  ALBUMIN 3.3* 3.0*   No results for input(s): LIPASE, AMYLASE in the last 168 hours.  Recent Labs Lab 07/08/17 1359 07/09/17 0602  AMMONIA 39* 14   Coagulation Profile:  Recent Labs Lab 07/03/17 1218  INR 1.08    CBC:  Recent Labs Lab 07/03/17 1218  07/05/17 0612 07/06/17 0611 07/07/17 0610 07/08/17 0614 07/09/17 0602  WBC 8.6  < > 10.9* 10.0 14.0* 12.2* 8.4  NEUTROABS 5.9  --   --   --   --   --   --   HGB 13.9  < > 15.0 11.7* 11.0* 9.2* 7.4*  HCT 40.5  < > 44.1 33.7* 31.5* 26.7* 22.0*  MCV 95.5  < > 95.5 95.5 93.8 96.0 97.3  PLT 169  < > 167 154 160 177 123*  < > = values in this interval not displayed. Cardiac Enzymes: No results for input(s): CKTOTAL, CKMB, CKMBINDEX, TROPONINI in the last 168 hours. BNP: Invalid input(s): POCBNP CBG: No results for input(s): GLUCAP in the last 168 hours. HbA1C: No results for input(s): HGBA1C in the last 72 hours. Urine analysis:    Component Value Date/Time   COLORURINE YELLOW 07/08/2017 1043   APPEARANCEUR HAZY (A) 07/08/2017 1043   LABSPEC 1.020 07/08/2017 1043   PHURINE 5.0 07/08/2017 1043   GLUCOSEU NEGATIVE 07/08/2017 1043   HGBUR SMALL (A) 07/08/2017 1043   BILIRUBINUR NEGATIVE 07/08/2017 1043   KETONESUR NEGATIVE 07/08/2017 1043   PROTEINUR NEGATIVE 07/08/2017 1043   UROBILINOGEN 0.2 11/21/2014 1341   NITRITE NEGATIVE 07/08/2017 1043   LEUKOCYTESUR NEGATIVE 07/08/2017 1043   Sepsis Labs: @LABRCNTIP (procalcitonin:4,lacticidven:4) ) Recent Results (from the past 240 hour(s))  Surgical PCR screen  Status: None   Collection Time: 07/03/17  6:00 PM  Result Value Ref Range Status   MRSA, PCR NEGATIVE NEGATIVE Final   Staphylococcus aureus NEGATIVE NEGATIVE Final    Comment:        The Xpert SA Assay (FDA approved for NASAL specimens in patients over 10 years of age), is one component of a comprehensive surveillance program.  Test performance has been validated by [facility] for patients greater than or equal to 20 year old. It is not intended to diagnose infection nor to guide or monitor treatment.      Scheduled Meds: . carbamide peroxide  5 drop Both EARS Weekly  . feeding supplement (ENSURE ENLIVE)  237 mL  Oral BID BM  . mouth rinse  15 mL Mouth Rinse BID  . metoCLOPramide (REGLAN) injection  5 mg Intravenous Q6H  . metoprolol tartrate  2.5 mg Intravenous Q6H   Continuous Infusions: . dextrose 5 % and 0.9 % NaCl with KCl 20 mEq/L 75 mL/hr at 07/09/17 0537  . heparin 500 Units/hr (07/09/17 0933)    Procedures/Studies: Ct Abdomen Pelvis Wo Contrast  Result Date: 07/07/2017 CLINICAL DATA:  Inpatient. Abdominal distention and pain. Recent mechanical fall with right hip fracture and subsequent right hip arthroplasty. History of hysterectomy. EXAM: CT ABDOMEN AND PELVIS WITHOUT CONTRAST TECHNIQUE: Multidetector CT imaging of the abdomen and pelvis was performed following the standard protocol without IV contrast. COMPARISON:  02/12/2015 CT abdomen/ pelvis. Abdominal radiograph from earlier today. FINDINGS: Lower chest: Small dependent bilateral pleural effusions with associated mild-to-moderate compressive atelectasis in the dependent lower lobes. Mild patchy tree-in-bud opacities and ground-glass opacities in the bilateral lower lobes. Coronary atherosclerosis. Oral contrast in the lower thoracic esophagus. Hepatobiliary: Two simple liver cysts, largest 4.2 cm centrally. No additional liver lesions. Normal liver size. Normal gallbladder with no radiopaque cholelithiasis. No definite biliary ductal dilatation. Common bile duct diameter 7 mm, probably normal for age. Pancreas: Normal, with no mass or duct dilation. Spleen: Normal size. No mass. Adrenals/Urinary Tract: Normal adrenals. No renal stones. No hydronephrosis. No contour deforming renal masses. Limited visualization of the mildly distended bladder due to streak artifact from the right hip hardware, with no gross bladder abnormality. Stomach/Bowel: Small to moderate hiatal hernia. There is moderate gastric distension by oral contrast. Well-positioned enteric tube with tip in the gastric fundus. No gastric wall thickening. There is diffuse mild small  bowel dilatation up to 3.5 cm diameter with scattered fluid levels in the small bowel. No small bowel wall thickening or focal small bowel caliber transition. Normal appendix. Mild diffuse distention of the colon with gas and stool. No large bowel wall thickening or pericolonic fat stranding. Vascular/Lymphatic: Atherosclerotic nonaneurysmal abdominal aorta. No pathologically enlarged lymph nodes in the abdomen or pelvis. Reproductive: Status post hysterectomy, with no abnormal findings at the vaginal cuff. No adnexal mass. Other: No pneumoperitoneum, ascites or focal fluid collection. Musculoskeletal: No aggressive appearing focal osseous lesions. Marked multilevel thoracolumbar degenerative disc disease. Partially visualized right hip hemiarthroplasty. Skin staples are seen lateral to the right hip. Expected postoperative soft tissue gas surrounding the right hip. IMPRESSION: 1. Mild diffuse small and large bowel dilatation with scattered small bowel fluid levels, compatible with postoperative adynamic ileus. No free air. 2. Small to moderate hiatal hernia. Oral contrast in the lower thoracic esophagus, suggesting esophageal dysmotility and/or gastroesophageal reflux. 3. Mild patchy tree-in-bud and ground-glass opacities at the lung bases, suggesting mild aspiration. 4. Small dependent bilateral pleural effusions with mild dependent bibasilar atelectasis.  5.  Aortic Atherosclerosis (ICD10-I70.0).  Coronary atherosclerosis. Electronically Signed   By: Delbert Phenix M.D.   On: 07/07/2017 18:13   Dg Chest 1 View  Result Date: 07/08/2017 CLINICAL DATA:  83 year old female with shortness of breath. Pleural effusions. Recent hip surgery. EXAM: CHEST 1 VIEW COMPARISON:  CT Abdomen and Pelvis 07/07/17. Chest radiograph 07/03/2017. FINDINGS: AP semi upright view at 1423 hours. The patient is rotated to the left. NG tube in place, appears to be looped in the stomach similar to the configuration yesterday. Small bilateral  pleural effusions were better demonstrated on the recent CT. Moderate size gastric hiatal hernia, stable mediastinal contours. Calcified aortic atherosclerosis. No pneumothorax, pulmonary edema or other confluent pulmonary opacity. IMPRESSION: Chest appears stable since the CT abdomen yesterday. Small pleural effusions. Stable NG tube. Electronically Signed   By: Odessa Fleming M.D.   On: 07/08/2017 14:49   Dg Chest 1 View  Result Date: 07/03/2017 CLINICAL DATA:  Hip fracture EXAM: CHEST 1 VIEW COMPARISON:  11/21/2015 FINDINGS: COPD with hyperinflation. Extensive apical scarring bilaterally unchanged. Negative for heart failure or pneumonia. Negative for pleural effusion. IMPRESSION: COPD with apical scarring. No acute abnormality and unchanged from the prior study. Electronically Signed   By: Marlan Palau M.D.   On: 07/03/2017 12:01   Dg Abd 1 View  Result Date: 07/07/2017 CLINICAL DATA:  NG tube placement. EXAM: ABDOMEN - 1 VIEW COMPARISON:  July 06, 2017 FINDINGS: An NG tube terminates in the left upper quadrant. The air-fluid level in the stomach is no longer seen. An air-filled structure left upper quadrant is likely mildly distended stomach. No obstruction or definitive ileus. IMPRESSION: 1. The NG tube is in good position. The air-fluid level in the stomach seen previously is no longer visualized. The stomach may remain mildly distended. 2. No obstruction or definitive ileus. Electronically Signed   By: Gerome Sam III M.D   On: 07/07/2017 07:25   Ct Head Wo Contrast  Result Date: 07/08/2017 CLINICAL DATA:  83 year old female status post fall onto right side during exercise class. Altered mental status. EXAM: CT HEAD WITHOUT CONTRAST TECHNIQUE: Contiguous axial images were obtained from the base of the skull through the vertex without intravenous contrast. COMPARISON:  Head CT 07/03/2017, brain MRI 11/21/2015, and earlier. FINDINGS: Brain: Chronic infarct with encephalomalacia in the right  superior frontal gyrus. Stable gray-white matter differentiation throughout the brain. No midline shift, ventriculomegaly, mass effect, evidence of mass lesion, intracranial hemorrhage or evidence of cortically based acute infarction. No other cortical encephalomalacia identified. Vascular: Calcified atherosclerosis at the skull base. Skull: Stable and intact. Sinuses/Orbits: Left side in G-tube in place. Visualized paranasal sinuses and mastoids are stable and well pneumatized. Other: No acute orbit or scalp soft tissue findings. IMPRESSION: No acute intracranial abnormality. No acute traumatic injury identified. Stable non contrast CT appearance of the brain, including chronic right MCA infarct. Electronically Signed   By: Odessa Fleming M.D.   On: 07/08/2017 14:46   Ct Head Wo Contrast  Result Date: 07/03/2017 CLINICAL DATA:  Fall at aerobics class. EXAM: CT HEAD WITHOUT CONTRAST TECHNIQUE: Contiguous axial images were obtained from the base of the skull through the vertex without intravenous contrast. COMPARISON:  11/21/2015 FINDINGS: Brain: No evidence of acute infarction, hemorrhage, hydrocephalus, extra-axial collection or mass lesion/mass effect. Chronic right frontal lobe encephalomalacia identified There is mild diffuse low-attenuation within the subcortical and periventricular white matter compatible with chronic microvascular disease. Prominence of the sulci and ventricles noted. Vascular: No hyperdense  vessel or unexpected calcification. Skull: Normal. Negative for fracture or focal lesion. Sinuses/Orbits: No acute finding. Other: None. IMPRESSION: 1. No acute intracranial abnormality. 2. Chronic small vessel ischemic disease and brain atrophy. 3. Chronic right frontal lobe infarct. Electronically Signed   By: Signa Kell M.D.   On: 07/03/2017 20:29   US Renal  Result Date: 07/08/2017 CLINICAL DATA:  Acute kidney injury. EXAM: RENAL / URINARY TRACT ULTRASOUND COMPLETE COMPARISON:  None. FINDINGS:  Right Kidney: Length: 9.0 cm. Echogenicity within normal limits. No mass or hydronephrosis visualized. Left Kidney: Length: 7.8 cm. Echogenicity within normal limits. No mass or hydronephrosis visualized. Bladder: The bladder is empty with a Foley catheter in place. Incidental note is made of right pleural effusion and liver cysts as demonstrated on prior CT scan. IMPRESSION: Normal appearing kidneys bilaterally. Electronically Signed   By: Francene Boyers M.D.   On: 07/08/2017 15:12   Pelvis Portable  Result Date: 07/05/2017 CLINICAL DATA:  Postop hip fracture EXAM: PORTABLE PELVIS 1-2 VIEWS COMPARISON:  07/03/2017 FINDINGS: Post right hip replacement. Normal AP alignment. No hardware or bony complicating feature. Mild degenerative changes in the left hip joint. IMPRESSION: Right hip replacement.  No complicating feature. Electronically Signed   By: Charlett Nose M.D.   On: 07/05/2017 10:42   Dg Chest Port 1 View  Result Date: 07/09/2017 CLINICAL DATA:  Check nasogastric catheter placement EXAM: PORTABLE CHEST 1 VIEW COMPARISON:  07/08/2017 FINDINGS: Nasogastric catheter remains coiled within the stomach. Cardiac shadow is stable. Stable hiatal hernia. Left-sided PICC line is noted in satisfactory position. Small left pleural effusion is again noted. IMPRESSION: Nasogastric catheter remains in the stomach. Remainder the exam is stable. Electronically Signed   By: Alcide Clever M.D.   On: 07/09/2017 10:44   Dg Abd 2 Views  Result Date: 07/06/2017 CLINICAL DATA:  Abdominal distention. EXAM: ABDOMEN - 2 VIEW COMPARISON:  February 14, 2015 FINDINGS: There is an air-fluid level in the left upper quadrant. Statistically, this is most likely to be within a distended stomach. No air under the right hemidiaphragm. Probable hiatal hernia. Lung bases otherwise normal. Rounded density over the left mid abdomen. No bowel obstruction. No other acute abnormalities. Scoliotic and degenerative changes of the spine.  IMPRESSION: 1. Air-fluid level over the left upper quadrant is statistically most likely to represent a stomach distended with gas and fluid. No free air as right hemidiaphragm. 2. There is a rounded density over the left mid abdomen on one view which is nonspecific. This could be artifactual or something on the patient. This was not seen previously. Recommend attention on follow-up. 3. No other acute abnormalities. Electronically Signed   By: Gerome Sam III M.D   On: 07/06/2017 13:35   Dg Hip Unilat With Pelvis 2-3 Views Right  Result Date: 07/03/2017 CLINICAL DATA:  Fall.  Right hip pain EXAM: DG HIP (WITH OR WITHOUT PELVIS) 2-3V RIGHT COMPARISON:  None. FINDINGS: Right femoral neck fracture with angulation and impaction. Fracture is in the subcapital region of the femoral neck. Calcification of the glenoid labrum. Joint space normal. No other pelvic fracture. Advanced disc degeneration L5-S1 IMPRESSION: Subcapital angulated fracture of the right femur. Electronically Signed   By: Marlan Palau M.D.   On: 07/03/2017 12:03    Rexford Prevo, DO  [facility] Pager [phone]  If 7PM-7AM, please contact night-coverage [email or web address] Password [email or web address] 07/09/2017, 4:17 PM   LOS: 6 days

## 2017-07-09 NOTE — Clinical Social Work Placement (Signed)
   CLINICAL SOCIAL WORK PLACEMENT  NOTE  Date:  07/09/2017  Patient Details  Name: Theresa Kennedy MRN: [id number] Date of Birth: [birth year]  Clinical Social Work is seeking post-discharge placement for this patient at the Skilled  Nursing Facility level of care (*CSW will initial, date and re-position this form in  chart as items are completed):  Yes   Patient/family provided with [facility] Clinical Social Work Department's list of facilities offering this level of care within the geographic area requested by the patient (or if unable, by the patient's family).  Yes   Patient/family informed of their freedom to choose among providers that offer the needed level of care, that participate in Medicare, Medicaid or managed care program needed by the patient, have an available bed and are willing to accept the patient.  Yes   Patient/family informed of 's ownership interest in [facility] and [facility], as well as of the fact that they are under no obligation to receive care at these facilities.  PASRR submitted to EDS on       PASRR number received on       Existing PASRR number confirmed on 07/09/17     FL2 transmitted to all facilities in geographic area requested by pt/family on 07/09/17     FL2 transmitted to all facilities within larger geographic area on       Patient informed that his/her managed care company has contracts with or will negotiate with certain facilities, including the following:            Patient/family informed of bed offers received.  Patient chooses bed at       Physician recommends and patient chooses bed at      Patient to be transferred to   on  .  Patient to be transferred to facility by       Patient family notified on   of transfer.  Name of family member notified:        PHYSICIAN       Additional Comment:    _______________________________________________ Annice Needy, LCSW 07/09/2017, 12:15 PM

## 2017-07-09 NOTE — Progress Notes (Signed)
ANTICOAGULATION CONSULT NOTE   Pharmacy Consult for Heparin Indication: atrial fibrillation  Allergies  Allergen Reactions  . Sulfa Antibiotics Other (See Comments)    unknown   Patient Measurements: Height: 5\' 3"  (160 cm) Weight: 106 lb 12.8 oz (48.4 kg) IBW/kg (Calculated) : 52.4 HEPARIN DW (KG): 48.4  Vital Signs: BP: 110/45 (08/21 1155) Pulse Rate: 73 (08/21 1155)  Labs:  Recent Labs  07/07/17 0610 07/08/17 0614 07/08/17 2036 07/09/17 0602 07/09/17 1529  HGB 11.0* 9.2*  --  7.4*  --   HCT 31.5* 26.7*  --  22.0*  --   PLT 160 177  --  123*  --   APTT  --   --  76* 138* 75*  CREATININE 1.19* 1.33*  --  1.04*  --    Estimated Creatinine Clearance: 25.8 mL/min (A) (by C-G formula based on SCr of 1.04 mg/dL (H)).  Medical History: Past Medical History:  Diagnosis Date  . Atrial fibrillation (HCC)   . GERD (gastroesophageal reflux disease)   . Hypertension   . Stroke [facility])    Medications:  Prescriptions Prior to Admission  Medication Sig Dispense Refill Last Dose  . amLODipine (NORVASC) 10 MG tablet Take 10 mg by mouth daily.    07/03/2017 at 0800  . apixaban (ELIQUIS) 2.5 MG TABS tablet Take 1 tablet (2.5 mg total) by mouth 2 (two) times daily. 120 tablet 0 07/03/2017 at 0800  . atorvastatin (LIPITOR) 10 MG tablet Take 10 mg by mouth daily at 6 PM.    07/02/2017 at Unknown time  . Calcium Carbonate-Vitamin D 600-200 MG-UNIT TABS Take 1 tablet by mouth daily.   07/03/2017 at 0800  . carbamide peroxide (DEBROX) 6.5 % otic solution Place 5 drops into both ears 2 (two) times daily. (Patient taking differently: Place 5 drops into both ears once a week. USE ONCE A WEEK) 15 mL 0 Past Week at Unknown time  . diphenhydrAMINE (BENADRYL) 25 MG tablet Take 25 mg by mouth every 6 (six) hours as needed for itching.   Past Week at Unknown time  . diphenhydrAMINE-zinc acetate (BENADRYL) cream Apply 1 application topically 2 (two) times daily as needed for itching.   Past Week at  Unknown time  . docusate sodium (COLACE) 100 MG capsule Take 1 capsule (100 mg total) by mouth daily. 30 capsule 1 07/03/2017 at 0800  . Grape Seed Extract 100 MG CAPS Take 100 mg by mouth daily.    07/03/2017 at 0800  . losartan (COZAAR) 25 MG tablet Take 25 mg by mouth every evening.    07/02/2017 at Unknown time  . metoprolol tartrate (LOPRESSOR) 25 MG tablet Take 25 mg by mouth 2 (two) times daily.   07/03/2017 at 0800  . Multiple Vitamin (MULTIVITAMIN WITH MINERALS) TABS tablet Take 1 tablet by mouth daily.   07/03/2017 at Unknown time  . Omega-3 Fatty Acids (FISH OIL) 1000 MG CAPS Take 1 capsule by mouth 2 (two) times daily.    07/03/2017 at Unknown time  . psyllium (METAMUCIL) 58.6 % packet Take 1 packet by mouth daily.   07/03/2017 at Unknown time  . ranitidine (ZANTAC) 150 MG capsule Take 150 mg by mouth 2 (two) times daily.    07/03/2017 at Unknown time   Brief History:  83 yo lady on eliquis pta.  Now on heparin for afib while she is NPO.  APTT this afternoon is therapeutic Goal of Therapy:  APTT 63-105 Monitor platelets by anticoagulation protocol: Yes   Plan:  Continue Heparin at 500 units/hr Check aPTT and heparin level daily CBC daily while on Heparin  Letroy Vazguez Poteet 07/09/2017,4:08 PM

## 2017-07-09 NOTE — Clinical Social Work Note (Signed)
Clinical Social Work Assessment  Patient Details  Name: Theresa Kennedy MRN: [id number] Date of Birth: 17-May-1921  Date of referral:  07/09/17               Reason for consult:  Discharge Planning                Permission sought to share information with:    Permission granted to share information::     Name::        Agency::     Relationship::     Contact Information:  Theresa Kennedy, niece, listed on chart.   Housing/Transportation Living arrangements for the past 2 months:  Single Family Home Source of Information:  Patient Patient Interpreter Needed:  None Criminal Activity/Legal Involvement Pertinent to Current Situation/Hospitalization:  No - Comment as needed Significant Relationships:  Other Family Members Lives with:  Self Do you feel safe going back to the place where you live?  Yes Need for family participation in patient care:  Yes (Comment)  Care giving concerns:  Theresa Kennedy, niece, advises that she is concerned about patient's nutrition when she is at home.   Social Worker assessment / plan:  Patient lives alone, ambulates independently and is independently in her ADLs.  Her family is agreeable to SNF and patient states that patient has indicated that she would prefer [facility].   Employment status:  Retired Database administrator PT Recommendations:  Skilled Nursing Facility Information / Referral to community resources:  Skilled Nursing Facility  Patient/Family's Response to care:  Patient and family are agreeable to SNF.   Patient/Family's Understanding of and Emotional Response to Diagnosis, Current Treatment, and Prognosis:  Patient and family understand patient's diagnosis, treatment and prognosis.    Emotional Assessment Appearance:  Appears stated age Attitude/Demeanor/Rapport:    Affect (typically observed):  Unable to Assess Orientation:  Oriented to Self, Oriented to Situation, Oriented to Place Alcohol / Substance use:    Psych  involvement (Current and /or in the community):  No (Comment)  Discharge Needs  Concerns to be addressed:  Discharge Planning Concerns Readmission within the last 30 days:  No Current discharge risk:  None Barriers to Discharge:  Insurance Authorization   Theresa Needy, LCSW 07/09/2017, 12:17 PM

## 2017-07-09 NOTE — Progress Notes (Signed)
OT Cancellation Note  Patient Details Name: Theresa Kennedy MRN: [id number] DOB: [birth year]   Cancelled Treatment:    Reason Eval/Treat Not Completed: OT screened, no needs identified, will sign off. Chart reviewed, spoke with PT, pt screened for OT needs. Pt unable to participate in full evaluation due to NG tube. Currently pt requiring max assist due to hip pain and mobility limitations, as well as additional medical issues. Pt is discharging to SNF, defer full evaluation to OT at SNF.   Ezra Sites, OTR/L  [phone] 07/09/2017, 1:23 PM

## 2017-07-09 NOTE — Progress Notes (Signed)
NG tube clamped per MD order. Will continue to monitor. Dressing to right hip also changed. Minimal bloody drainage.

## 2017-07-10 DIAGNOSIS — D62 Acute posthemorrhagic anemia: Secondary | ICD-10-CM

## 2017-07-10 LAB — CBC
HCT: 21.2 % — ABNORMAL LOW (ref 36.0–46.0)
HCT: 26.6 % — ABNORMAL LOW (ref 36.0–46.0)
Hemoglobin: 6.9 g/dL — CL (ref 12.0–15.0)
Hemoglobin: 8.9 g/dL — ABNORMAL LOW (ref 12.0–15.0)
MCH: 33 pg (ref 26.0–34.0)
MCH: 32.5 pg (ref 26.0–34.0)
MCHC: 32.5 g/dL (ref 30.0–36.0)
MCHC: 33.5 g/dL (ref 30.0–36.0)
MCV: 101.4 fL — AB (ref 78.0–100.0)
MCV: 97.1 fL (ref 78.0–100.0)
PLATELETS: 67 10*3/uL — AB (ref 150–400)
PLATELETS: 48 10*3/uL — AB (ref 150–400)
RBC: 2.09 MIL/uL — ABNORMAL LOW (ref 3.87–5.11)
RBC: 2.74 MIL/uL — AB (ref 3.87–5.11)
RDW: 15.4 % (ref 11.5–15.5)
RDW: 16.5 % — ABNORMAL HIGH (ref 11.5–15.5)
WBC: 8.7 10*3/uL (ref 4.0–10.5)
WBC: 9.4 10*3/uL (ref 4.0–10.5)

## 2017-07-10 LAB — BASIC METABOLIC PANEL
Anion gap: 5 (ref 5–15)
BUN: 33 mg/dL — ABNORMAL HIGH (ref 6–20)
CALCIUM: 8.2 mg/dL — AB (ref 8.9–10.3)
CO2: 29 mmol/L (ref 22–32)
CREATININE: 0.75 mg/dL (ref 0.44–1.00)
Chloride: 108 mmol/L (ref 101–111)
Glucose, Bld: 103 mg/dL — ABNORMAL HIGH (ref 65–99)
Potassium: 4.2 mmol/L (ref 3.5–5.1)
SODIUM: 142 mmol/L (ref 135–145)

## 2017-07-10 LAB — URINE CULTURE: CULTURE: NO GROWTH

## 2017-07-10 LAB — APTT: APTT: 53 s — AB (ref 24–36)

## 2017-07-10 LAB — PREPARE RBC (CROSSMATCH)

## 2017-07-10 LAB — HEPARIN LEVEL (UNFRACTIONATED): HEPARIN UNFRACTIONATED: 0.35 [IU]/mL (ref 0.30–0.70)

## 2017-07-10 LAB — MAGNESIUM: MAGNESIUM: 1.8 mg/dL (ref 1.7–2.4)

## 2017-07-10 MED ORDER — SODIUM CHLORIDE 0.9 % IV SOLN
Freq: Once | INTRAVENOUS | Status: AC
  Administered 2017-07-10: 14:00:00 via INTRAVENOUS

## 2017-07-10 NOTE — Progress Notes (Signed)
CRITICAL VALUE ALERT  Critical Value: HgB 6.9  Date & Time Notied:  07/10/17 5974  Provider Notified: Dr. Conley Rolls  Orders Received/Actions taken:

## 2017-07-10 NOTE — Progress Notes (Signed)
NG tube removed per MD order as pt tolerated clear liquids.

## 2017-07-10 NOTE — Care Management Note (Signed)
Case Management Note  Patient Details  Name: Theresa Kennedy MRN: [id number] Date of Birth: 1921/02/05  Subjective/Objective:                  Adm with fall, s/p hip repair. Recommended for SNF.   Action/Plan: CSW aware of SNF referral and making arrangements.   Expected Discharge Date:   07/12/2017               Expected Discharge Plan:  Skilled Nursing Facility  In-House Referral:  Clinical Social Work  Discharge planning Services  CM Consult  Post Acute Care Choice:  NA Choice offered to:  NA  DME Arranged:    DME Agency:     HH Arranged:    HH Agency:     Status of Service:  Completed, signed off  If discussed at [employer], dates discussed:    Additional Comments:  Harveer Sadler, Chrystine Oiler, RN 07/10/2017, 11:44 AM

## 2017-07-10 NOTE — Progress Notes (Signed)
ANTICOAGULATION CONSULT NOTE   Pharmacy Consult for Heparin Indication: atrial fibrillation  Allergies  Allergen Reactions  . Sulfa Antibiotics Other (See Comments)    unknown   Patient Measurements: Height: 5\' 3"  (160 cm) Weight: 106 lb 12.8 oz (48.4 kg) IBW/kg (Calculated) : 52.4 HEPARIN DW (KG): 48.4  Vital Signs: Temp: 97.9 F (36.6 C) (08/22 0511) Temp Source: Oral (08/22 0511) BP: 135/52 (08/22 0511) Pulse Rate: 102 (08/22 0511)  Labs:  Recent Labs  07/08/17 0614  07/09/17 0602 07/09/17 1529 07/10/17 0542  HGB 9.2*  --  7.4*  --  6.9*  HCT 26.7*  --  22.0*  --  21.2*  PLT 177  --  123*  --  67*  APTT  --   < > 138* 75* 53*  HEPARINUNFRC  --   --   --   --  0.35  CREATININE 1.33*  --  1.04*  --  0.75  < > = values in this interval not displayed. Estimated Creatinine Clearance: 33.6 mL/min (by C-G formula based on SCr of 0.75 mg/dL).  Medical History: Past Medical History:  Diagnosis Date  . Atrial fibrillation (HCC)   . GERD (gastroesophageal reflux disease)   . Hypertension   . Stroke [facility])    Medications:  Prescriptions Prior to Admission  Medication Sig Dispense Refill Last Dose  . amLODipine (NORVASC) 10 MG tablet Take 10 mg by mouth daily.    07/03/2017 at 0800  . apixaban (ELIQUIS) 2.5 MG TABS tablet Take 1 tablet (2.5 mg total) by mouth 2 (two) times daily. 120 tablet 0 07/03/2017 at 0800  . atorvastatin (LIPITOR) 10 MG tablet Take 10 mg by mouth daily at 6 PM.    07/02/2017 at Unknown time  . Calcium Carbonate-Vitamin D 600-200 MG-UNIT TABS Take 1 tablet by mouth daily.   07/03/2017 at 0800  . carbamide peroxide (DEBROX) 6.5 % otic solution Place 5 drops into both ears 2 (two) times daily. (Patient taking differently: Place 5 drops into both ears once a week. USE ONCE A WEEK) 15 mL 0 Past Week at Unknown time  . diphenhydrAMINE (BENADRYL) 25 MG tablet Take 25 mg by mouth every 6 (six) hours as needed for itching.   Past Week at Unknown time  .  diphenhydrAMINE-zinc acetate (BENADRYL) cream Apply 1 application topically 2 (two) times daily as needed for itching.   Past Week at Unknown time  . docusate sodium (COLACE) 100 MG capsule Take 1 capsule (100 mg total) by mouth daily. 30 capsule 1 07/03/2017 at 0800  . Grape Seed Extract 100 MG CAPS Take 100 mg by mouth daily.    07/03/2017 at 0800  . losartan (COZAAR) 25 MG tablet Take 25 mg by mouth every evening.    07/02/2017 at Unknown time  . metoprolol tartrate (LOPRESSOR) 25 MG tablet Take 25 mg by mouth 2 (two) times daily.   07/03/2017 at 0800  . Multiple Vitamin (MULTIVITAMIN WITH MINERALS) TABS tablet Take 1 tablet by mouth daily.   07/03/2017 at Unknown time  . Omega-3 Fatty Acids (FISH OIL) 1000 MG CAPS Take 1 capsule by mouth 2 (two) times daily.    07/03/2017 at Unknown time  . psyllium (METAMUCIL) 58.6 % packet Take 1 packet by mouth daily.   07/03/2017 at Unknown time  . ranitidine (ZANTAC) 150 MG capsule Take 150 mg by mouth 2 (two) times daily.    07/03/2017 at Unknown time   Brief History:  83 yo lady on eliquis pta.  Now on heparin for afib while she is NPO.  Heparin level this AM is therapeutic.  Hg dropped to 6.9.  MD aware.  Goal of Therapy:  Hep Level 0.3-0.7 Monitor platelets by anticoagulation protocol: Yes   Plan:  Heparin stopped. No anticoag at this time due to bleeding at bandage site (discussed with MD). HIT antibodies ordered, but 4T score of 3 places patient at low risk for HIT. HIT Ab and SRA labs ordered.  HIT Ab generally reports back sooner, but can have false (+) results.  SRA takes longer to result but is more sensitive and specific for diagnosing HIT.  Lamonte Richer R 07/10/2017,7:37 AM

## 2017-07-10 NOTE — Progress Notes (Signed)
[facility] TEAM 3  Theresa Kennedy  ZOX:[id number] DOB: October 26, 1921 DOA: 07/03/2017 PCP: Joette Catching, MD    Brief Narrative:  83 y.o.femalewith a history of stroke, HTN, HLD, and atrial fibrillation who presented after a mechanical fall onto her right side while at an exercise dance class on the morning of 07/03/2017 due to her feet getting tangled up.   In the ED X-ray of the right hip revealed a subcapital angulated fracture of the right femur. EKG showed atrial fibrillation with nonspecific T-wave changes. Ortho was consulted to assist in management. The patient underwent a right hip bipolar replacement on 07/05/2017. Her postoperative course has been complicated by an ileus requiring NG tube placement as well as acute metabolic encephalopathy.  Subjective: The patient is awake alert oriented and quite pleasant to speak with.  She is very anxious for her NG tube be removed.  She denies chest pain shortness breath fevers chills or a significant change in the moderate pain at her right hip surgical site.  I have spoken to her about my desire to discontinue her anticoagulation temporarily.  I have explained to her that this carries with it a significant risk of stroke related to her atrial fibrillation but that it is my belief that presently her risk of major bleeding is even higher than her risk of stroke.  She understands that she could suffer a stroke without a blood thinner but that at present I feel we have little option but to stop it.  Our plan will be to resume anticoagulation as soon as possible if she remains stable, possibly in the morning.  Assessment & Plan:  Subcapital right femur fracture  R-hip bipolar replacement 8/17 - will eventually require SNF for rehab stay  Acute blood loss anemia due to hip fracture, surgery, dilution, and now possible hematoma formation in area of the R hip/tracking up R lower abdom/flank - stop anticoag for today - monitor wound and flank -  transfuse 1 U PRBC w/ goal to keep Hgb 7.0 or >  Thrombocytopenia Likely related to consumption in setting of signif acute surgery - HIT a possibility, which could increase her risk of clotting, but presently anticoag too high risk until clear if major bleeding is going to occur into her R thigh/abdom/flank  IIeus  NG placed - reglan initiated - appears to have resolved w/ pt having a bowel movement last night - give clears - if tolerates will d/c NG today and slowly advance diet further  AKI renal US revealed no hydronephrosis - baseline creatinine 0.8-0.9 - creatinine stable and baseline range  Acute metabolic encephalopathy multifactorial including AKI, ileus, opioids - CT brain neg for acute findings - UA unrevealing - B12 702 - ammonia 14 - appears to be back to her baseline alert and conversant mental status  Acute respiratory failure with hypoxia felt to be due to hypoventilation - resolved - wean to room air  Atrial fibrillation  Chadsvasc is 6 - rate controlled - plan was to resumeapixaban 8/19w/ Ortho blessing - switched to IV heparin as unable to take po - now complicated by signif bleeding at the surgical site - stop heparin for now - follow serial CBC after transfusion - resume anticoag asap given high risk of CVA   Acute urine retention foley placed 8/20 - voiding trial prior to d/c  HTN Blood pressure currently reasonably controlled  Hx of stroke 2014 pt without difficulty in performing ADLs per baseline - 07/03/2017 CT brain negative for acute  findings noting old right frontal lobe infarct  Hyponatremia chronic - sodium is presently normal  Goal of care Dr. Arbutus Leas discussed with nephew (HPOA) who confirmed patient is DNR  DVT prophylaxis: stopping IV heparin - SCDs Code Status: NO CODE - DNR Family Communication: no family present at time of exam  Disposition Plan: resume anticoag asap - monitor for bleeding at surgical site   Consultants:  Ortho Romeo Apple  Antimicrobials:  none  Objective: Blood pressure (!) 135/52, pulse (!) 102, temperature 97.9 F (36.6 C), temperature source Oral, resp. rate 18, height 5\' 3"  (1.6 m), weight 48.4 kg (106 lb 12.8 oz), SpO2 95 %.  Intake/Output Summary (Last 24 hours) at 07/10/17 1008 Last data filed at 07/10/17 1610  Gross per 24 hour  Intake          1942.74 ml  Output                0 ml  Net          1942.74 ml   Filed Weights   07/03/17 1037 07/03/17 1635 07/03/17 1645  Weight: 50.8 kg (112 lb) 49 kg (108 lb) 48.4 kg (106 lb 12.8 oz)    Examination: General: No acute respiratory distress Lungs: Clear to auscultation bilaterally without wheezes or crackles Cardiovascular: Irregularly irregular - rate controlled -  no appreciable murmur  Abdomen: Nontender, nondistended, soft, bowel sounds positive, no rebound, no ascites, no appreciable mass Extremities: No significant cyanosis, clubbing, or edema bilateral lower extremities Wound:  With help from the patient's nurse the patient is rolled into the left lateral decubitus position and her surgical dressing removed.  The wound itself appears to be intact.  There is a mild amount of bright red blood staining the dressing.  The nurse reports there was significantly more this morning when she changed the  dressing less than an hour prior.  There is significant ecchymosis superior to the wound and tracking up the right lateral abdominal quadrant and up into the right flank without large hematoma presently palpable on gross exam   CBC:  Recent Labs Lab 07/03/17 1218  07/06/17 0611 07/07/17 0610 07/08/17 0614 07/09/17 0602 07/10/17 0542  WBC 8.6  < > 10.0 14.0* 12.2* 8.4 8.7  NEUTROABS 5.9  --   --   --   --   --   --   HGB 13.9  < > 11.7* 11.0* 9.2* 7.4* 6.9*  HCT 40.5  < > 33.7* 31.5* 26.7* 22.0* 21.2*  MCV 95.5  < > 95.5 93.8 96.0 97.3 101.4*  PLT 169  < > 154 160 177 123* 67*  < > = values in this interval not displayed. Basic  Metabolic Panel:  Recent Labs Lab 07/06/17 0611 07/07/17 0610 07/08/17 0614 07/09/17 0602 07/10/17 0542  NA 131* 131* 134* 138 142  K 4.1 4.4 4.0 4.2 4.2  CL 92* 90* 93* 101 108  CO2 32 30 28 32 29  GLUCOSE 160* 173* 183* 131* 103*  BUN 27* 51* 57* 52* 33*  CREATININE 0.77 1.19* 1.33* 1.04* 0.75  CALCIUM 9.3 10.4* 9.3 8.7* 8.2*  MG  --   --   --   --  1.8   GFR: Estimated Creatinine Clearance: 33.6 mL/min (by C-G formula based on SCr of 0.75 mg/dL).  Liver Function Tests:  Recent Labs Lab 07/08/17 0614 07/09/17 0602  AST 68* 47*  ALT 26 22  ALKPHOS 32* 27*  BILITOT 0.9 0.9  PROT 5.2* 4.7*  ALBUMIN 3.3* 3.0*    Recent Labs Lab 07/08/17 1359 07/09/17 0602  AMMONIA 39* 14    Coagulation Profile:  Recent Labs Lab 07/03/17 1218  INR 1.08    HbA1C: Hgb A1c MFr Bld  Date/Time Value Ref Range Status  11/21/2015 01:14 AM 6.3 (H) 4.8 - 5.6 % Final    Comment:    (NOTE)         Pre-diabetes: 5.7 - 6.4         Diabetes: >6.4         Glycemic control for adults with diabetes: <7.0   04/20/2013 04:20 AM 5.3 <5.7 % Final    Comment:    (NOTE)                                                                       According to the ADA Clinical Practice Recommendations for 2011, when HbA1c is used as a screening test:  >=6.5%   Diagnostic of Diabetes Mellitus           (if abnormal result is confirmed) 5.7-6.4%   Increased risk of developing Diabetes Mellitus References:Diagnosis and Classification of Diabetes Mellitus,Diabetes Care,2011,34(Suppl 1):S62-S69 and Standards of Medical Care in         Diabetes - 2011,Diabetes Care,2011,34 (Suppl 1):S11-S61.     Recent Results (from the past 240 hour(s))  Surgical PCR screen     Status: None   Collection Time: 07/03/17  6:00 PM  Result Value Ref Range Status   MRSA, PCR NEGATIVE NEGATIVE Final   Staphylococcus aureus NEGATIVE NEGATIVE Final    Comment:        The Xpert SA Assay (FDA approved for NASAL  specimens in patients over 67 years of age), is one component of a comprehensive surveillance program.  Test performance has been validated by [facility] for patients greater than or equal to 94 year old. It is not intended to diagnose infection nor to guide or monitor treatment.   Urine Culture     Status: None   Collection Time: 07/08/17 10:43 AM  Result Value Ref Range Status   Specimen Description URINE, CLEAN CATCH  Final   Special Requests NONE  Final   Culture   Final    NO GROWTH Performed at [facility], [address] [place], [place] [address]    Report Status 07/10/2017 FINAL  Final     Scheduled Meds: . carbamide peroxide  5 drop Both EARS Weekly  . feeding supplement (ENSURE ENLIVE)  237 mL Oral BID BM  . mouth rinse  15 mL Mouth Rinse BID  . metoCLOPramide (REGLAN) injection  5 mg Intravenous Q6H  . metoprolol tartrate  2.5 mg Intravenous Q6H     LOS: 7 days   Lonia Blood, MD [facility] Office  [phone] Pager - Text Page per Amion as per below:  On-Call/Text Page:      [email or web address]      password [email or web address]  If 7PM-7AM, please contact night-coverage [email or web address] Password [email or web address] 07/10/2017, 10:08 AM

## 2017-07-10 NOTE — Progress Notes (Signed)
Pt bandage to Right Hip changed, moderate- copious amount of bloody drainage. Top staples closest to patient look like they are coming loose, producing bleeding. MD will be notified. Will continue to monitor.

## 2017-07-10 NOTE — Progress Notes (Signed)
PT Cancellation Note  Patient Details Name: Theresa Kennedy MRN: [id number] DOB: 07-20-1921   Cancelled Treatment:    Reason Eval/Treat Not Completed: Medical issues which prohibited therapy. Chart reviewed, RN consulted. Holing PT treatment at this time due to H&H out of safe range for participation with PT and high exertion activity. As of most recent labs: Hb: 6.9, HCT: 21.2. Will continue to monitor remotely and attempt treatment again when patient is medically appropriate.   11:19 AM, 07/10/17 Rosamaria Lints, PT, DPT Physical Therapist - [place] [phone] (Office)    Demari Gales C 07/10/2017, 11:17 AM

## 2017-07-11 LAB — COMPREHENSIVE METABOLIC PANEL
ALT: 17 U/L (ref 14–54)
AST: 34 U/L (ref 15–41)
Albumin: 2.9 g/dL — ABNORMAL LOW (ref 3.5–5.0)
Alkaline Phosphatase: 30 U/L — ABNORMAL LOW (ref 38–126)
Anion gap: 5 (ref 5–15)
BILIRUBIN TOTAL: 1.5 mg/dL — AB (ref 0.3–1.2)
BUN: 23 mg/dL — AB (ref 6–20)
CALCIUM: 8.1 mg/dL — AB (ref 8.9–10.3)
CO2: 28 mmol/L (ref 22–32)
CREATININE: 0.72 mg/dL (ref 0.44–1.00)
Chloride: 101 mmol/L (ref 101–111)
Glucose, Bld: 100 mg/dL — ABNORMAL HIGH (ref 65–99)
Potassium: 3.8 mmol/L (ref 3.5–5.1)
Sodium: 134 mmol/L — ABNORMAL LOW (ref 135–145)
TOTAL PROTEIN: 4.5 g/dL — AB (ref 6.5–8.1)

## 2017-07-11 LAB — BPAM RBC
Blood Product Expiration Date: 201808282359
ISSUE DATE / TIME: 201808221401
Unit Type and Rh: 9500

## 2017-07-11 LAB — CBC
HEMATOCRIT: 26.8 % — AB (ref 36.0–46.0)
Hemoglobin: 9.1 g/dL — ABNORMAL LOW (ref 12.0–15.0)
MCH: 32.7 pg (ref 26.0–34.0)
MCHC: 34 g/dL (ref 30.0–36.0)
MCV: 96.4 fL (ref 78.0–100.0)
Platelets: 45 10*3/uL — ABNORMAL LOW (ref 150–400)
RBC: 2.78 MIL/uL — AB (ref 3.87–5.11)
RDW: 17.4 % — ABNORMAL HIGH (ref 11.5–15.5)
WBC: 13.1 10*3/uL — AB (ref 4.0–10.5)

## 2017-07-11 LAB — TYPE AND SCREEN
ABO/RH(D): O POS
Antibody Screen: NEGATIVE
Unit division: 0

## 2017-07-11 LAB — HEPARIN INDUCED PLATELET AB (HIT ANTIBODY): Heparin Induced Plt Ab: 2.283 OD — ABNORMAL HIGH (ref 0.000–0.400)

## 2017-07-11 MED ORDER — BOOST / RESOURCE BREEZE PO LIQD
1.0000 | Freq: Three times a day (TID) | ORAL | Status: DC
  Administered 2017-07-11 – 2017-07-16 (×9): 1 via ORAL

## 2017-07-11 MED ORDER — METOPROLOL TARTRATE 25 MG PO TABS
25.0000 mg | ORAL_TABLET | Freq: Two times a day (BID) | ORAL | Status: DC
  Administered 2017-07-11 – 2017-07-16 (×10): 25 mg via ORAL
  Filled 2017-07-11 (×10): qty 1

## 2017-07-11 MED ORDER — APIXABAN 2.5 MG PO TABS
2.5000 mg | ORAL_TABLET | Freq: Two times a day (BID) | ORAL | Status: DC
  Administered 2017-07-11 – 2017-07-12 (×3): 2.5 mg via ORAL
  Filled 2017-07-11 (×3): qty 1

## 2017-07-11 MED ORDER — APIXABAN 2.5 MG PO TABS: 2.5000 mg | ORAL_TABLET | Freq: Two times a day (BID) | ORAL | Status: DC

## 2017-07-11 MED ORDER — PRO-STAT SUGAR FREE PO LIQD
30.0000 mL | Freq: Two times a day (BID) | ORAL | Status: DC
  Administered 2017-07-11 – 2017-07-16 (×11): 30 mL via ORAL
  Filled 2017-07-11 (×11): qty 30

## 2017-07-11 MED ORDER — ADULT MULTIVITAMIN W/MINERALS CH
1.0000 | ORAL_TABLET | Freq: Every day | ORAL | Status: DC
  Administered 2017-07-11 – 2017-07-16 (×6): 1 via ORAL
  Filled 2017-07-11 (×6): qty 1

## 2017-07-11 MED ORDER — BETHANECHOL CHLORIDE 10 MG PO TABS
10.0000 mg | ORAL_TABLET | Freq: Three times a day (TID) | ORAL | Status: DC
  Administered 2017-07-11 – 2017-07-16 (×15): 10 mg via ORAL
  Filled 2017-07-11 (×19): qty 1

## 2017-07-11 NOTE — Care Management Important Message (Signed)
Important Message  Patient Details  Name: Theresa Kennedy MRN: [id number] Date of Birth: October 30, 1921   Medicare Important Message Given:  Yes    Haille Pardi, Chrystine Oiler, RN 07/11/2017, 9:03 AM

## 2017-07-11 NOTE — Progress Notes (Signed)
Nutrition Follow-up    INTERVENTION:  Boost Breeze po TID, each supplement provides 250 kcal and 9 grams of protein   ProStat 30 ml TID (each 30 ml provides 100 kcal, 15 gr protein)   Popcicles-  ad lib   NUTRITION DIAGNOSIS:   Inadequate oral intake related to poor appetite as evidenced by per patient/family report.   GOAL:   Patient will meet greater than or equal to 90% of their needs   MONITOR:   Diet advancement, PO intake, Supplement acceptance, Skin, Labs, Weight trends   ASSESSMENT:  Theresa Kennedy is s/p right bipolar hip replacement on 8/17 following which she developed an ileus and needed an NGT placed on 8/18 until 8/22. She has essentially been NPO /Clears most of her current hospitalization.  Additionally: 8/22 critical hemoglobin -6.9 and 8/22 copious bloody drainage from surgical site per nursing. She remains pleasant and today she did very well with clear liquids 100% of broth, jello and tea. She doesn't like the plain ice chips but would like a popcicle and she is willing to try the clear liquid oral nutrition supplement. RD emphasized her acute need for both calories and protein given her recent major surgery and the complications associated which has delayed her being able to advance her diet as hoped.  Recent Labs Lab 07/09/17 0602 07/10/17 0542 07/11/17 0513  NA 138 142 134*  K 4.2 4.2 3.8  CL 101 108 101  CO2 32 29 28  BUN 52* 33* 23*  CREATININE 1.04* 0.75 0.72  CALCIUM 8.7* 8.2* 8.1*  MG  --  1.8  --   GLUCOSE 131* 103* 100*  Labs: Sodium 134 and BUN and Cr returning to normal range   Diet Order:  Diet bariatric clear liquid Room service appropriate? Yes; Fluid consistency: Thin  Skin:  Reviewed, no issues  Last BM:  8/15   Height:   Ht Readings from Last 1 Encounters:  07/03/17 5\' 3"  (1.6 m)    Weight:   Wt Readings from Last 1 Encounters:  07/03/17 106 lb 12.8 oz (48.4 kg)  obtained bed wt today 8/23- 114.8 lbs- (do not expect this  is her ACTUAL wt) -   Ideal Body Weight:  52 kg  BMI:  Body mass index is 18.92 kg/m.  Estimated Nutritional Needs:   Kcal:  1584-1680 (33-35 kcal/kg)  Protein:  60-65 gr (1.2-1.3 gr/kg)  Fluid:  >1200 ml daily  EDUCATION NEEDS:   Education needs addressed  Royann Shivers Theresa,RD,CSG,LDN Office: [phone] Pager: [phone]

## 2017-07-11 NOTE — Progress Notes (Signed)
[facility] TEAM 3  Theresa Kennedy  ZOX:[id number] DOB: [birth year] DOA: 07/03/2017 PCP: Joette Catching, MD    Brief Narrative:  83 y.o.femalewith a history of stroke, HTN, HLD, and atrial fibrillation who presented after a mechanical fall onto her right side while at an exercise dance class on the morning of 07/03/2017 due to her feet getting tangled up.   In the ED X-ray of the right hip revealed a subcapital angulated fracture of the right femur. EKG showed atrial fibrillation with nonspecific T-wave changes. Ortho was consulted to assist in management. The patient underwent a right hip bipolar replacement on 07/05/2017. Her postoperative course has been complicated by an ileus requiring NG tube placement as well as acute metabolic encephalopathy.  Subjective: The patient is sleepy today but easily awakens to voice and is conversant.  She tells me she feels better overall.  She does complain of some irritation of the urethra relate to her foley.  She denies any significant pain in the right hip.  She denies shortness of breath or chest pain.  Assessment & Plan:  Subcapital right femur fracture  R-hip bipolar replacement 8/17 - begin search for SNF for rehab stay  Acute blood loss anemia due to hip fracture, surgery, dilution, and possible hematoma formation in area of the R hip/tracking up R lower abdom/flank - anticoagulation has been held since yesterday morning - no signs of persisting blood loss or significant hematoma formation at surgical site - continue to watch hemoglobin and resume anticoagulation this evening   Thrombocytopenia Likely related to consumption in setting of signif acute surgery - HIT a possibility w/ labs pending - avoid heparin related products for now   IIeus  NG placed - reglan initiated - clinically appears resolved with NG removed 8/22  - continue to advance diet as tolerated   AKI renal US revealed no hydronephrosis - baseline creatinine 0.8-0.9 -  creatinine stable and normal presently   Acute metabolic encephalopathy multifactorial including AKI, ileus, opioids - CT brain neg for acute findings - UA unrevealing - B12 702 - ammonia 14 - appears to be back to her baseline mental status  Acute respiratory failure with hypoxia felt to be due to hypoventilation - resolved - wean to room air  Atrial fibrillation  Chadsvasc is 6 - rate controlled - plan was to resumeapixaban 8/19w/ Ortho blessing - switched to IV heparin as was unable to take po -  heparin stopped 822 due to concern for possible bleeding at surgical site - no persisting evidence to suggest that this is a concern - resume her usual oral anticoagulation today   Acute urine retention foley placed 8/20 - begin voiding trial 8/24 - initiate urecholine today   HTN BP reasonably controlled  Hx of stroke 2014 pt without difficulty in performing ADLs per baseline - 07/03/2017 CT brain negative for acute findings noting old right frontal lobe infarct  Hyponatremia chronic - following   Goal of care Dr. Arbutus Leas discussed with nephew (HPOA) who confirmed patient is DNR  DVT prophylaxis: apixiban Code Status: NO CODE - DNR Family Communication: no family present at time of exam  Disposition Plan: monitor for bleeding at surgical site - plan to d/c foley 8/24 - should be ready for SNF d/c in ~48hrs   Consultants:  Ortho Romeo Apple  Antimicrobials:  none  Objective: Blood pressure (!) 146/47, pulse 87, temperature 98.4 F (36.9 C), temperature source Oral, resp. rate 20, height 5\' 3"  (1.6 m), weight 48.4 kg (  106 lb 12.8 oz), SpO2 97 %.  Intake/Output Summary (Last 24 hours) at 07/11/17 1355 Last data filed at 07/11/17 0800  Gross per 24 hour  Intake             2717 ml  Output             1325 ml  Net             1392 ml   Filed Weights   07/03/17 1037 07/03/17 1635 07/03/17 1645  Weight: 50.8 kg (112 lb) 49 kg (108 lb) 48.4 kg (106 lb 12.8 oz)     Examination: General: No acute respiratory distress - somnolent  Lungs: CTA w/o wheezing  Cardiovascular: Irregularly irregular - rate controlled Abdomen: NT/ND, soft, bs+, no rebound  Extremities: No significant edema bilateral lower extremities Wound:  No signif change in character of wound today - no evidence of worsening hematoma or signif blood loss at surgical site   CBC:  Recent Labs Lab 07/08/17 0614 07/09/17 0602 07/10/17 0542 07/10/17 1853 07/11/17 0513  WBC 12.2* 8.4 8.7 9.4 13.1*  HGB 9.2* 7.4* 6.9* 8.9* 9.1*  HCT 26.7* 22.0* 21.2* 26.6* 26.8*  MCV 96.0 97.3 101.4* 97.1 96.4  PLT 177 123* 67* 48* 45*   Basic Metabolic Panel:  Recent Labs Lab 07/07/17 0610 07/08/17 0614 07/09/17 0602 07/10/17 0542 07/11/17 0513  NA 131* 134* 138 142 134*  K 4.4 4.0 4.2 4.2 3.8  CL 90* 93* 101 108 101  CO2 30 28 32 29 28  GLUCOSE 173* 183* 131* 103* 100*  BUN 51* 57* 52* 33* 23*  CREATININE 1.19* 1.33* 1.04* 0.75 0.72  CALCIUM 10.4* 9.3 8.7* 8.2* 8.1*  MG  --   --   --  1.8  --    GFR: Estimated Creatinine Clearance: 33.6 mL/min (by C-G formula based on SCr of 0.72 mg/dL).  Liver Function Tests:  Recent Labs Lab 07/08/17 0614 07/09/17 0602 07/11/17 0513  AST 68* 47* 34  ALT 26 22 17   ALKPHOS 32* 27* 30*  BILITOT 0.9 0.9 1.5*  PROT 5.2* 4.7* 4.5*  ALBUMIN 3.3* 3.0* 2.9*    Recent Labs Lab 07/08/17 1359 07/09/17 0602  AMMONIA 39* 14    HbA1C: Hgb A1c MFr Bld  Date/Time Value Ref Range Status  11/21/2015 01:14 AM 6.3 (H) 4.8 - 5.6 % Final    Comment:    (NOTE)         Pre-diabetes: 5.7 - 6.4         Diabetes: >6.4         Glycemic control for adults with diabetes: <7.0   04/20/2013 04:20 AM 5.3 <5.7 % Final    Comment:    (NOTE)                                                                       According to the ADA Clinical Practice Recommendations for 2011, when HbA1c is used as a screening test:  >=6.5%   Diagnostic of Diabetes  Mellitus           (if abnormal result is confirmed) 5.7-6.4%   Increased risk of developing Diabetes Mellitus References:Diagnosis and Classification of Diabetes Mellitus,Diabetes Care,2011,34(Suppl 1):S62-S69 and Standards of Medical  Care in         Diabetes - 2011,Diabetes Care,2011,34 (Suppl 1):S11-S61.     Recent Results (from the past 240 hour(s))  Surgical PCR screen     Status: None   Collection Time: 07/03/17  6:00 PM  Result Value Ref Range Status   MRSA, PCR NEGATIVE NEGATIVE Final   Staphylococcus aureus NEGATIVE NEGATIVE Final    Comment:        The Xpert SA Assay (FDA approved for NASAL specimens in patients over 84 years of age), is one component of a comprehensive surveillance program.  Test performance has been validated by [facility] for patients greater than or equal to 37 year old. It is not intended to diagnose infection nor to guide or monitor treatment.   Urine Culture     Status: None   Collection Time: 07/08/17 10:43 AM  Result Value Ref Range Status   Specimen Description URINE, CLEAN CATCH  Final   Special Requests NONE  Final   Culture   Final    NO GROWTH Performed at [facility], [address], [place], [place] [address]    Report Status 07/10/2017 FINAL  Final     Scheduled Meds: . carbamide peroxide  5 drop Both EARS Weekly  . feeding supplement  1 Container Oral TID BM  . feeding supplement (PRO-STAT SUGAR FREE 64)  30 mL Oral BID  . mouth rinse  15 mL Mouth Rinse BID  . metoprolol tartrate  2.5 mg Intravenous Q6H  . multivitamin with minerals  1 tablet Oral Daily     LOS: 8 days   Lonia Blood, MD [facility] Office  [phone] Pager - Text Page per Loretha Stapler as per below:  On-Call/Text Page:      [email or web address]      password [email or web address]  If 7PM-7AM, please contact night-coverage [email or web address] Password [email or web address] 07/11/2017, 1:55 PM

## 2017-07-11 NOTE — Care Management Note (Signed)
Case Management Note  Patient Details  Name: TACY BREDESEN MRN: [id number] Date of Birth: 11-06-1921   If discussed at Long Length of Stay Meetings, dates discussed:  07/11/2017  Additional Comments:  Apryle Stowell, Chrystine Oiler, RN 07/11/2017, 12:07 PM

## 2017-07-11 NOTE — Progress Notes (Signed)
Physical Therapy Treatment Patient Details Name: Theresa Kennedy MRN: [id number] DOB: 11/30/1920 Today's Date: 07/11/2017    History of Present Illness Theresa Kennedy is a 83 y.o. female with medical history of stroke, essential hypertension, hyperlipidemia, and atrial fibrillation presented with a mechanical fall onto her right side while at an exercise dance class on the morning of 07/03/2017. The patient states that her feet got tangled up and resulted in a mechanical fall onto her right side. Because she had immense pain, and she was not able to get up, EMS was activated. Patient denies fevers, chills, headache, chest pain, dyspnea, nausea, vomiting, diarrhea, abdominal pain, dysuria, hematuria, hematochezia, and melena.  PT Comments    Patient demonstrates increased BLE strength for taking steps and Mod assist to transfer to chair, tolerated sitting up for approx 30 minutes without complications, limited for taking steps due to c/o fatigue.  Follow Up Recommendations  SNF     Equipment Recommendations  None recommended by PT    Recommendations for Other Services       Precautions / Restrictions Precautions Precautions: Fall Precaution Comments: direct lateral THA precuations Restrictions Weight Bearing Restrictions: Yes RLE Weight Bearing: Weight bearing as tolerated (RLE) Other Position/Activity Restrictions: hip precautions for direct lateral THA    Mobility  Bed Mobility Overal bed mobility: Needs Assistance Bed Mobility: Supine to Sit Rolling: Mod assist   Supine to sit: Mod assist Sit to supine: Mod assist   General bed mobility comments: Patient requires assist to move RLE  Transfers Overall transfer level: Needs assistance Equipment used: Rolling walker (2 wheeled) Transfers: Sit to/from Stand Sit to Stand: Min assist Stand pivot transfers: Min assist       General transfer comment: Patient able to transfer to chair with Mod  assist  Ambulation/Gait Ambulation/Gait assistance: Mod assist Ambulation Distance (Feet): 5 Feet Assistive device: Rolling walker (2 wheeled) Gait Pattern/deviations: Decreased stride length;Decreased step length - right;Decreased step length - left;Decreased stance time - right   Gait velocity interpretation: Below normal speed for age/gender General Gait Details: Patient demonstrates increased tolerance for taking steps, limited secondary to c/o fatigue   Stairs            Wheelchair Mobility    Modified Rankin (Stroke Patients Only)       Balance Overall balance assessment: Needs assistance Sitting-balance support: Feet supported Sitting balance-Leahy Scale: Fair     Standing balance support: Bilateral upper extremity supported Standing balance-Leahy Scale: Poor                              Cognition Arousal/Alertness: Awake/alert Behavior During Therapy: WFL for tasks assessed/performed Overall Cognitive Status: Within Functional Limits for tasks assessed                                        Exercises Total Joint Exercises Ankle Circles/Pumps: Supine;Both;10 reps Quad Sets: Supine;Both;10 reps Gluteal Sets: Supine;Both;10 reps Short Arc Quad: Supine;Right;10 reps Heel Slides: Supine;Right;10 reps    General Comments        Pertinent Vitals/Pain Pain Score: 5  Pain Location: right hip Pain Descriptors / Indicators: Constant;Dull Pain Intervention(s): Monitored during session;Limited activity within patient's tolerance    Home Living                      Prior  Function            PT Goals (current goals can now be found in the care plan section) Acute Rehab PT Goals Patient Stated Goal: Return home PT Goal Formulation: With patient Time For Goal Achievement: 07/19/17 Progress towards PT goals: Progressing toward goals    Frequency    Min 3X/week      PT Plan Current plan remains appropriate     Co-evaluation              AM-PAC PT "6 Clicks" Daily Activity  Outcome Measure  Difficulty turning over in bed (including adjusting bedclothes, sheets and blankets)?: Unable Difficulty moving from lying on back to sitting on the side of the bed? : Unable Difficulty sitting down on and standing up from a chair with arms (e.g., wheelchair, bedside commode, etc,.)?: Unable Help needed moving to and from a bed to chair (including a wheelchair)?: A Lot Help needed walking in hospital room?: A Lot Help needed climbing 3-5 steps with a railing? : Total 6 Click Score: 8    End of Session Equipment Utilized During Treatment: Gait belt Activity Tolerance: Patient limited by fatigue Patient left: in bed;with call bell/phone within reach;with SCD's reapplied Nurse Communication: Mobility status PT Visit Diagnosis: Unsteadiness on feet (R26.81);Other abnormalities of gait and mobility (R26.89);Muscle weakness (generalized) (M62.81);History of falling (Z91.81)     Time: 1022-1050 PT Time Calculation (min) (ACUTE ONLY): 28 min  Charges:  $Therapeutic Activity: 8-22 mins                    G Codes:       {2:25 PM, 03-Aug-2017 Ocie Bob, MPT Physical Therapist with [facility] [phone] office [phone] mobile phone

## 2017-07-12 ENCOUNTER — Inpatient Hospital Stay (HOSPITAL_COMMUNITY): Payer: Medicare HMO

## 2017-07-12 DIAGNOSIS — D72829 Elevated white blood cell count, unspecified: Secondary | ICD-10-CM

## 2017-07-12 LAB — SEROTONIN RELEASE ASSAY (SRA): SRA, LOW DOSE HEPARIN: 59 % — AB (ref 0–20)

## 2017-07-12 LAB — BASIC METABOLIC PANEL
Anion gap: 6 (ref 5–15)
BUN: 21 mg/dL — AB (ref 6–20)
CHLORIDE: 97 mmol/L — AB (ref 101–111)
CO2: 27 mmol/L (ref 22–32)
CREATININE: 0.65 mg/dL (ref 0.44–1.00)
Calcium: 8 mg/dL — ABNORMAL LOW (ref 8.9–10.3)
GFR calc Af Amer: >60 mL/min (ref >60)
GFR calc non Af Amer: >60 mL/min (ref >60)
GLUCOSE: 104 mg/dL — AB (ref 65–99)
POTASSIUM: 3.6 mmol/L (ref 3.5–5.1)
SODIUM: 130 mmol/L — AB (ref 135–145)

## 2017-07-12 LAB — CBC
HCT: 28.1 % — ABNORMAL LOW (ref 36.0–46.0)
HEMOGLOBIN: 9.6 g/dL — AB (ref 12.0–15.0)
MCH: 32.8 pg (ref 26.0–34.0)
MCHC: 34.2 g/dL (ref 30.0–36.0)
MCV: 95.9 fL (ref 78.0–100.0)
Platelets: 38 10*3/uL — ABNORMAL LOW (ref 150–400)
RBC: 2.93 MIL/uL — AB (ref 3.87–5.11)
RDW: 17.1 % — ABNORMAL HIGH (ref 11.5–15.5)
WBC: 15.4 10*3/uL — ABNORMAL HIGH (ref 4.0–10.5)

## 2017-07-12 MED ORDER — FUROSEMIDE 10 MG/ML IJ SOLN
40.0000 mg | Freq: Once | INTRAMUSCULAR | Status: AC
  Administered 2017-07-12: 40 mg via INTRAVENOUS
  Filled 2017-07-12: qty 4

## 2017-07-12 NOTE — Progress Notes (Signed)
Physical Therapy Treatment Patient Details Name: Theresa Kennedy MRN: [id number] DOB: [birth year] Today's Date: 07/12/2017    History of Present Illness Theresa Kennedy is a 83 y.o. female with medical history of stroke, essential hypertension, hyperlipidemia, and atrial fibrillation presented with a mechanical fall onto her right side while at an exercise dance class on the morning of 07/03/2017. The patient states that her feet got tangled up and resulted in a mechanical fall onto her right side. Because she had immense pain, and she was not able to get up, EMS was activated. Patient denies fevers, chills, headache, chest pain, dyspnea, nausea, vomiting, diarrhea, abdominal pain, dysuria, hematuria, hematochezia, and melena.    PT Comments    Patient demonstrated increased tolerance for taking steps, with verbal cues for proper placement of hands on RW, leans over RW secondary to fatigue, poor balance, Mod assist with RW x 10 steps, limited mostly due to fatigue, tolerated sitting up in chair for 30 minutes before put back to bed.   Follow Up Recommendations  SNF     Equipment Recommendations  None recommended by PT    Recommendations for Other Services       Precautions / Restrictions Precautions Precautions: Fall Precaution Comments: direct lateral THA precuations Restrictions Weight Bearing Restrictions: Yes RLE Weight Bearing: Weight bearing as tolerated Other Position/Activity Restrictions: hip precautions for direct lateral THA    Mobility  Bed Mobility Overal bed mobility: Needs Assistance Bed Mobility: Supine to Sit;Sit to Supine Rolling: Mod assist   Supine to sit: Mod assist Sit to supine: Mod assist   General bed mobility comments: Patient has difficulty sitting up at bedside due to generalized weakness  Transfers Overall transfer level: Needs assistance Equipment used: Rolling walker (2 wheeled) Transfers: Sit to/from Stand Sit to Stand: Min assist Stand  pivot transfers: Min assist       General transfer comment: Patient able to transfer to chair with Mod assist  Ambulation/Gait Ambulation/Gait assistance: Mod assist Ambulation Distance (Feet): 10 Feet Assistive device: Rolling walker (2 wheeled) Gait Pattern/deviations: Decreased stride length;Decreased step length - right;Decreased step length - left;Decreased stance time - right   Gait velocity interpretation: Below normal speed for age/gender General Gait Details: Patient limited for taking steps mostly due to c/o fatigue   Stairs            Wheelchair Mobility    Modified Rankin (Stroke Patients Only)       Balance Overall balance assessment: Needs assistance Sitting-balance support: Feet supported Sitting balance-Leahy Scale: Fair     Standing balance support: Bilateral upper extremity supported Standing balance-Leahy Scale: Poor                              Cognition Arousal/Alertness: Awake/alert Behavior During Therapy: WFL for tasks assessed/performed Overall Cognitive Status: Within Functional Limits for tasks assessed                                        Exercises Total Joint Exercises Ankle Circles/Pumps: Supine;Both;10 reps Quad Sets: Supine;Both;10 reps Gluteal Sets: Supine;Both;10 reps Towel Squeeze: Both Short Arc Quad: Supine;Right;10 reps;Both Heel Slides: Supine;Right;10 reps;Both    General Comments        Pertinent Vitals/Pain Pain Assessment: Faces Pain Score: 2  Faces Pain Scale: Hurts a little bit Pain Location: right hip Pain Descriptors /  Indicators: Discomfort Pain Intervention(s): Limited activity within patient's tolerance;Monitored during session    Home Living                      Prior Function            PT Goals (current goals can now be found in the care plan section) Acute Rehab PT Goals Patient Stated Goal: Return home after rehab PT Goal Formulation: With  patient Time For Goal Achievement: 07/19/17 Potential to Achieve Goals: Good Progress towards PT goals: Progressing toward goals    Frequency    Min 3X/week      PT Plan Current plan remains appropriate    Co-evaluation              AM-PAC PT "6 Clicks" Daily Activity  Outcome Measure  Difficulty turning over in bed (including adjusting bedclothes, sheets and blankets)?: Unable Difficulty moving from lying on back to sitting on the side of the bed? : Unable Difficulty sitting down on and standing up from a chair with arms (e.g., wheelchair, bedside commode, etc,.)?: Unable Help needed moving to and from a bed to chair (including a wheelchair)?: A Lot Help needed walking in hospital room?: A Lot Help needed climbing 3-5 steps with a railing? : Total 6 Click Score: 8    End of Session Equipment Utilized During Treatment: Gait belt Activity Tolerance: Patient limited by fatigue Patient left: in bed;with call bell/phone within reach Nurse Communication: Mobility status PT Visit Diagnosis: Unsteadiness on feet (R26.81);Other abnormalities of gait and mobility (R26.89);Muscle weakness (generalized) (M62.81);History of falling (Z91.81)     Time: 4098-1191 PT Time Calculation (min) (ACUTE ONLY): 31 min  Charges:  $Therapeutic Exercise: 8-22 mins $Therapeutic Activity: 8-22 mins                    G Codes:       4:30 PM, August 11, 2017 Ocie Bob, MPT Physical Therapist with [facility] [phone] office [phone] mobile phone

## 2017-07-12 NOTE — Progress Notes (Signed)
ANTICOAGULATION CONSULT NOTE -Follow Up  Medication Note for Heparin Indication: atrial fibrillation  Allergies  Allergen Reactions  . Heparin     HIT positive  . Sulfa Antibiotics Other (See Comments)    unknown   Patient Measurements: Height: 5\' 3"  (160 cm) Weight: 106 lb 12.8 oz (48.4 kg) IBW/kg (Calculated) : 52.4 HEPARIN DW (KG): 48.4  Vital Signs: Temp: 97.7 F (36.5 C) (08/24 0437) Temp Source: Oral (08/24 0437) BP: 157/72 (08/24 0437) Pulse Rate: 86 (08/24 0437)  Labs:  Recent Labs  07/09/17 1529  07/10/17 0542 07/10/17 1853 07/11/17 0513 07/12/17 0554  HGB  --   < > 6.9* 8.9* 9.1* 9.6*  HCT  --   < > 21.2* 26.6* 26.8* 28.1*  PLT  --   < > 67* 48* 45* 38*  APTT 75*  --  53*  --   --   --   HEPARINUNFRC  --   --  0.35  --   --   --   CREATININE  --   --  0.75  --  0.72 0.65  < > = values in this interval not displayed. Estimated Creatinine Clearance: 33.6 mL/min (by C-G formula based on SCr of 0.65 mg/dL).  Medical History: Past Medical History:  Diagnosis Date  . Atrial fibrillation (HCC)   . GERD (gastroesophageal reflux disease)   . Hypertension   . Stroke [facility])    Medications:  Prescriptions Prior to Admission  Medication Sig Dispense Refill Last Dose  . amLODipine (NORVASC) 10 MG tablet Take 10 mg by mouth daily.    07/03/2017 at 0800  . apixaban (ELIQUIS) 2.5 MG TABS tablet Take 1 tablet (2.5 mg total) by mouth 2 (two) times daily. 120 tablet 0 07/03/2017 at 0800  . atorvastatin (LIPITOR) 10 MG tablet Take 10 mg by mouth daily at 6 PM.    07/02/2017 at Unknown time  . Calcium Carbonate-Vitamin D 600-200 MG-UNIT TABS Take 1 tablet by mouth daily.   07/03/2017 at 0800  . carbamide peroxide (DEBROX) 6.5 % otic solution Place 5 drops into both ears 2 (two) times daily. (Patient taking differently: Place 5 drops into both ears once a week. USE ONCE A WEEK) 15 mL 0 Past Week at Unknown time  . diphenhydrAMINE (BENADRYL) 25 MG tablet Take 25 mg by mouth  every 6 (six) hours as needed for itching.   Past Week at Unknown time  . diphenhydrAMINE-zinc acetate (BENADRYL) cream Apply 1 application topically 2 (two) times daily as needed for itching.   Past Week at Unknown time  . docusate sodium (COLACE) 100 MG capsule Take 1 capsule (100 mg total) by mouth daily. 30 capsule 1 07/03/2017 at 0800  . Grape Seed Extract 100 MG CAPS Take 100 mg by mouth daily.    07/03/2017 at 0800  . losartan (COZAAR) 25 MG tablet Take 25 mg by mouth every evening.    07/02/2017 at Unknown time  . metoprolol tartrate (LOPRESSOR) 25 MG tablet Take 25 mg by mouth 2 (two) times daily.   07/03/2017 at 0800  . Multiple Vitamin (MULTIVITAMIN WITH MINERALS) TABS tablet Take 1 tablet by mouth daily.   07/03/2017 at Unknown time  . Omega-3 Fatty Acids (FISH OIL) 1000 MG CAPS Take 1 capsule by mouth 2 (two) times daily.    07/03/2017 at Unknown time  . psyllium (METAMUCIL) 58.6 % packet Take 1 packet by mouth daily.   07/03/2017 at Unknown time  . ranitidine (ZANTAC) 150 MG capsule Take  150 mg by mouth 2 (two) times daily.    07/03/2017 at Unknown time   Brief History:  83 yo lady on eliquis pta.  Was placed heparin for afib while she was NPO for surgery.  Hg dropped to 6. And platelets dropped.  Suspected HIT and heparin stopped with HIT labs ordered.   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 Heparin stopped was stopped. Apixaban was prescribed.  Allergy for Heparin on medication profile  Elder Cyphers, BS Loura Back, BCPS Clinical Pharmacist Pager [phone] 07/12/2017,9:02 AM

## 2017-07-12 NOTE — Progress Notes (Signed)
[facility] TEAM 3  Theresa Kennedy  KCL:[id number] DOB: Oct 24, 1921 DOA: 07/03/2017 PCP: Joette Catching, MD    Brief Narrative:  83 y.o.femalewith a history of stroke, HTN, HLD, and atrial fibrillation who presented after a mechanical fall onto her right side while at an exercise dance class on the morning of 07/03/2017 due to her feet getting tangled up.   In the ED X-ray of the right hip revealed a subcapital angulated fracture of the right femur. EKG showed atrial fibrillation with nonspecific T-wave changes. Ortho was consulted to assist in management. The patient underwent a right hip bipolar replacement on 07/05/2017. Her postoperative course has been complicated by an ileus requiring NG tube placement as well as acute metabolic encephalopathy.  Subjective: The patient is in good spirits.  She denies any specific complaints at this time.  Her white blood cell count has been slowly trending upwards.  She continues to have a significant amount of serosanguineous discharge from the superior portion of her right hip surgical wound.  She denies shortness of breath nausea vomiting or abdominal pain.  Assessment & Plan:  Subcapital right femur fracture  R-hip bipolar replacement 8/17 - pt reportedly has a bed available for SNF placement on 8/25 - w/ slowly climbing WBC and drainage from wound will check CT of the thigh to r/o seroma or abscess formation - no purulent d/c from wound or local erythema appreciable   Acute blood loss anemia due to hip fracture, surgery, dilution, and possible hematoma formation in area of the R hip/tracking up R lower abdom/flank - anticoagulation has been held since yesterday morning - no signs of persisting blood loss or significant hematoma formation at surgical site - continue to watch hemoglobin and resume anticoagulation this evening   Thrombocytopenia 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 - pt has been off heparin since 8/22 - avoid heparin related products -  cont DOAC    IIeus  NG placed - reglan initiated - clinically appears resolved with NG removed 8/22 - tolerating her soft diet without difficulty  AKI renal US revealed no hydronephrosis - baseline creatinine 0.8-0.9 - creatinine stable and normal presently   Acute metabolic encephalopathy multifactorial including AKI, ileus, opioids - CT brain neg for acute findings - UA unrevealing - B12 702 - ammonia 14 - appears to be back to her baseline mental status  Acute respiratory failure with hypoxia felt to be due to hypoventilation - resolved - wean to room air  Atrial fibrillation  Chadsvasc is 6 - rate controlled - plan was to resumeapixaban 8/19w/ Ortho blessing - switched to IV heparin as was unable to take po - heparin stopped 822 due to concern for bleeding at surgical site - now on DOAC   Acute urinary retention foley placed 8/20 - urecholine started 8/23 - d/c foley today and watch for urinary retention recurrence   HTN BP reasonably controlled  Hx of stroke 2014 pt without difficulty in performing ADLs per baseline - 07/03/2017 CT brain negative for acute findings noting old right frontal lobe infarct  Hyponatremia chronic - may be reflective of a degree of modest volume overload, though pt has no hx of CHF - one time dose of lasix today - recheck in AM   Goal of care Dr. Arbutus Leas discussed with nephew (HPOA) who confirmed patient is DNR  DVT prophylaxis: apixiban Code Status: NO CODE - DNR Family Communication: no family present at time of exam  Disposition Plan: monitor for bleeding at surgical site -  d/c foley - CT of hip to r/o abscess or seroma formation - UA and blood cx in setting of climbing WBC - possible d/c to SNF 8/25 if all issues stable/resolved (if not will need to be delayed)   Consultants:  Ortho Romeo Apple  Antimicrobials:  none  Objective: Blood pressure (!) 135/57, pulse 96, temperature 98.5 F (36.9 C), temperature source Oral, resp. rate 18,  height 5\' 3"  (1.6 m), weight 48.4 kg (106 lb 12.8 oz), SpO2 95 %.  Intake/Output Summary (Last 24 hours) at 07/12/17 1533 Last data filed at 07/12/17 0900  Gross per 24 hour  Intake              240 ml  Output             1900 ml  Net            -1660 ml   Filed Weights   07/03/17 1037 07/03/17 1635 07/03/17 1645  Weight: 50.8 kg (112 lb) 49 kg (108 lb) 48.4 kg (106 lb 12.8 oz)    Examination: General: No acute respiratory distress - alert and pleasant  Lungs: CTA th/o - no wheeze or crackles  Cardiovascular: Irregularly irregular - rate controlled - no gallup or rub  Abdomen: NT/ND, soft, bs+, no rebound - no ascites  Extremities: No significant edema bilateral lower extremities - 1+ edema in R hip  Wound:  Increased serosanguinous drainage from surgical wound - no frank bleeding - no erythema or purulent d/c at time of my exam   CBC:  Recent Labs Lab 07/09/17 0602 07/10/17 0542 07/10/17 1853 07/11/17 0513 07/12/17 0554  WBC 8.4 8.7 9.4 13.1* 15.4*  HGB 7.4* 6.9* 8.9* 9.1* 9.6*  HCT 22.0* 21.2* 26.6* 26.8* 28.1*  MCV 97.3 101.4* 97.1 96.4 95.9  PLT 123* 67* 48* 45* 38*   Basic Metabolic Panel:  Recent Labs Lab 07/08/17 0614 07/09/17 0602 07/10/17 0542 07/11/17 0513 07/12/17 0554  NA 134* 138 142 134* 130*  K 4.0 4.2 4.2 3.8 3.6  CL 93* 101 108 101 97*  CO2 28 32 29 28 27   GLUCOSE 183* 131* 103* 100* 104*  BUN 57* 52* 33* 23* 21*  CREATININE 1.33* 1.04* 0.75 0.72 0.65  CALCIUM 9.3 8.7* 8.2* 8.1* 8.0*  MG  --   --  1.8  --   --    GFR: Estimated Creatinine Clearance: 33.6 mL/min (by C-G formula based on SCr of 0.65 mg/dL).  Liver Function Tests:  Recent Labs Lab 07/08/17 0614 07/09/17 0602 07/11/17 0513  AST 68* 47* 34  ALT 26 22 17   ALKPHOS 32* 27* 30*  BILITOT 0.9 0.9 1.5*  PROT 5.2* 4.7* 4.5*  ALBUMIN 3.3* 3.0* 2.9*    Recent Labs Lab 07/08/17 1359 07/09/17 0602  AMMONIA 39* 14    HbA1C: Hgb A1c MFr Bld  Date/Time Value Ref Range  Status  11/21/2015 01:14 AM 6.3 (H) 4.8 - 5.6 % Final    Comment:    (NOTE)         Pre-diabetes: 5.7 - 6.4         Diabetes: >6.4         Glycemic control for adults with diabetes: <7.0   04/20/2013 04:20 AM 5.3 <5.7 % Final    Comment:    (NOTE)  According to the ADA Clinical Practice Recommendations for 2011, when HbA1c is used as a screening test:  >=6.5%   Diagnostic of Diabetes Mellitus           (if abnormal result is confirmed) 5.7-6.4%   Increased risk of developing Diabetes Mellitus References:Diagnosis and Classification of Diabetes Mellitus,Diabetes Care,2011,34(Suppl 1):S62-S69 and Standards of Medical Care in         Diabetes - 2011,Diabetes Care,2011,34 (Suppl 1):S11-S61.     Recent Results (from the past 240 hour(s))  Surgical PCR screen     Status: None   Collection Time: 07/03/17  6:00 PM  Result Value Ref Range Status   MRSA, PCR NEGATIVE NEGATIVE Final   Staphylococcus aureus NEGATIVE NEGATIVE Final    Comment:        The Xpert SA Assay (FDA approved for NASAL specimens in patients over 63 years of age), is one component of a comprehensive surveillance program.  Test performance has been validated by [facility] for patients greater than or equal to 70 year old. It is not intended to diagnose infection nor to guide or monitor treatment.   Urine Culture     Status: None   Collection Time: 07/08/17 10:43 AM  Result Value Ref Range Status   Specimen Description URINE, CLEAN CATCH  Final   Special Requests NONE  Final   Culture   Final    NO GROWTH Performed at [facility], [address] [place], [place] [address]    Report Status 07/10/2017 FINAL  Final     Scheduled Meds: . apixaban  2.5 mg Oral BID  . bethanechol  10 mg Oral TID  . carbamide peroxide  5 drop Both EARS Weekly  . feeding supplement  1 Container Oral TID BM  . feeding supplement (PRO-STAT SUGAR FREE  64)  30 mL Oral BID  . mouth rinse  15 mL Mouth Rinse BID  . metoprolol tartrate  25 mg Oral BID  . multivitamin with minerals  1 tablet Oral Daily     LOS: 9 days   Lonia Blood, MD [facility] Office  [phone] Pager - Text Page per Loretha Stapler as per below:  On-Call/Text Page:      [email or web address]      password [email or web address]  If 7PM-7AM, please contact night-coverage [email or web address] Password [email or web address] 07/12/2017, 3:33 PM

## 2017-07-12 NOTE — Clinical Social Work Note (Signed)
LCSW left a message for Francene Castle advising that patient may discharge on Saturday and inquiring about patient's insurance authorization.     Valton Schwartz, Juleen China, LCSW

## 2017-07-12 NOTE — Clinical Social Work Note (Signed)
LCSW received a message from [facility] advising that they had received authorization for patient for SNF with a start date of 07/13/17.   Zitlaly Malson, Juleen China, LCSW

## 2017-07-12 NOTE — Clinical Social Work Note (Signed)
LCSW notified patient that her authorization was pending with [facility] for SNF authorization. Patricia at [facility] advised that they started authorization and it was in pending status. She advised that they could not take an LOG.     Khloie Hamada, Juleen China, LCSW

## 2017-07-13 DIAGNOSIS — K929 Disease of digestive system, unspecified: Secondary | ICD-10-CM

## 2017-07-13 DIAGNOSIS — D696 Thrombocytopenia, unspecified: Secondary | ICD-10-CM

## 2017-07-13 LAB — URINALYSIS, ROUTINE W REFLEX MICROSCOPIC
Bilirubin Urine: NEGATIVE
GLUCOSE, UA: NEGATIVE mg/dL
KETONES UR: NEGATIVE mg/dL
Nitrite: NEGATIVE
PH: 6 (ref 5.0–8.0)
PROTEIN: NEGATIVE mg/dL
SQUAMOUS EPITHELIAL / LPF: NONE SEEN
Specific Gravity, Urine: 1.005 (ref 1.005–1.030)

## 2017-07-13 LAB — BASIC METABOLIC PANEL
ANION GAP: 6 (ref 5–15)
BUN: 22 mg/dL — ABNORMAL HIGH (ref 6–20)
CALCIUM: 8.1 mg/dL — AB (ref 8.9–10.3)
CO2: 30 mmol/L (ref 22–32)
Chloride: 97 mmol/L — ABNORMAL LOW (ref 101–111)
Creatinine, Ser: 0.75 mg/dL (ref 0.44–1.00)
GLUCOSE: 111 mg/dL — AB (ref 65–99)
Potassium: 3.4 mmol/L — ABNORMAL LOW (ref 3.5–5.1)
SODIUM: 133 mmol/L — AB (ref 135–145)

## 2017-07-13 LAB — CBC
HEMATOCRIT: 28.6 % — AB (ref 36.0–46.0)
HEMATOCRIT: 34.6 % — AB (ref 36.0–46.0)
Hemoglobin: 9.6 g/dL — ABNORMAL LOW (ref 12.0–15.0)
Hemoglobin: 11.5 g/dL — ABNORMAL LOW (ref 12.0–15.0)
MCH: 32.8 pg (ref 26.0–34.0)
MCH: 33.1 pg (ref 26.0–34.0)
MCHC: 33.6 g/dL (ref 30.0–36.0)
MCHC: 33.2 g/dL (ref 30.0–36.0)
MCV: 97.6 fL (ref 78.0–100.0)
MCV: 99.7 fL (ref 78.0–100.0)
PLATELETS: 11 10*3/uL — AB (ref 150–400)
Platelets: 28 10*3/uL — CL (ref 150–400)
RBC: 2.93 MIL/uL — AB (ref 3.87–5.11)
RBC: 3.47 MIL/uL — ABNORMAL LOW (ref 3.87–5.11)
RDW: 17.4 % — AB (ref 11.5–15.5)
RDW: 18.4 % — AB (ref 11.5–15.5)
WBC: 20.3 10*3/uL — AB (ref 4.0–10.5)
WBC: 21.9 10*3/uL — AB (ref 4.0–10.5)

## 2017-07-13 LAB — LACTATE DEHYDROGENASE: LDH: 263 U/L — ABNORMAL HIGH (ref 98–192)

## 2017-07-13 MED ORDER — ACETAMINOPHEN 325 MG PO TABS
650.0000 mg | ORAL_TABLET | Freq: Once | ORAL | Status: AC
  Administered 2017-07-14: 650 mg via ORAL
  Filled 2017-07-13: qty 2

## 2017-07-13 MED ORDER — POTASSIUM CHLORIDE CRYS ER 20 MEQ PO TBCR
20.0000 meq | EXTENDED_RELEASE_TABLET | Freq: Once | ORAL | Status: AC
  Administered 2017-07-13: 20 meq via ORAL
  Filled 2017-07-13: qty 1

## 2017-07-13 MED ORDER — DEXTROSE 5 % IV SOLN
1.0000 g | INTRAVENOUS | Status: DC
  Administered 2017-07-14 (×2): 1 g via INTRAVENOUS
  Filled 2017-07-13 (×4): qty 10

## 2017-07-13 MED ORDER — SODIUM CHLORIDE 0.9 % IV SOLN
Freq: Once | INTRAVENOUS | Status: AC
  Administered 2017-07-14: 02:00:00 via INTRAVENOUS

## 2017-07-13 NOTE — Progress Notes (Addendum)
Patient ID: Theresa Kennedy, female   DOB: [birth year], 83 y.o.   MRN: [id number]                                                                PROGRESS NOTE                                                                                                                                                                                                             Patient Demographics:    Theresa Kennedy, is a 83 y.o. female, DOB - 22-Dec-1920, [id number]:[id number]  Admit date - 07/03/2017   Admitting Physician Catarina Hartshorn, MD  Outpatient Primary MD for the patient is Joette Catching, MD  LOS - 10  Outpatient Specialists:    Chief Complaint  Patient presents with  . Fall       Brief Narrative  83 y.o.femalewith a history of stroke, HTN, HLD, and atrial fibrillation who presented after a mechanical fall onto her right side while at an exercise dance class on the morning of 07/03/2017 due to her feet getting tangled up.   In the ED X-ray of the right hip revealed a subcapital angulated fracture of the right femur. EKG showed atrial fibrillation with nonspecific T-wave changes. Ortho was consulted to assist in management. The patient underwent a right hip bipolar replacement on 07/05/2017. Her postoperative course has been complicated by an ileus requiring NG tube placement as well as acute metabolic encephalopathy.   Subjective:    Theresa Kennedy today seems alert, pain controlled.  slight oozing of blood from incision site.  Bruises over her right side.    No headache, No chest pain, No abdominal pain - No Nausea, No new weakness tingling or numbness, No Cough - SOB.    Assessment  & Plan :    Active Problems:   Hyponatremia   Ileus, postoperative (HCC)   Other fracture of right femur, initial encounter for closed fracture [facility])   Atrial fibrillation (HCC)   Essential hypertension   Abdominal distension   Adynamic ileus (HCC)   Hip fracture (HCC)   AKI (acute kidney injury) (HCC)   Acute  metabolic encephalopathy   Subcapital right femur fracture  R-hip bipolar replacement 8/17 - pt reportedly has a bed available for SNF placement on 8/25 - w/  slowly climbing WBC and drainage from wound will check CT of the thigh to r/o seroma or abscess formation - no purulent d/c from wound or local erythema appreciable CT scan pending  Acute blood loss anemia due to hip fracture, surgery, dilution, and possible hematoma formation in area of the R hip/tracking up R lower abdom/flank - anticoagulation has been held since yesterday morning - no signs of persisting blood loss or significant hematoma formation at surgical site - continue to watch hemoglobin and resume anticoagulation this evening   Thrombocytopenia 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 - pt has been off heparin since 8/22 - avoid heparin related products -    will STOP Eliquis for now due to oozing from incision and thrombocytopenia Check cbc in am, if worsening might need oncology consultation  Leukocytosis Of unclear source Check cbc in am  IIeus  NG placed - reglan initiated - clinically appears resolved with NG removed 8/22 - tolerating her soft diet without difficulty  AKI renal US revealed no hydronephrosis - baseline creatinine 0.8-0.9 - creatinine stable and normal presently   Acute metabolic encephalopathy multifactorial including AKI, ileus, opioids - CT brain neg for acute findings - UA unrevealing - B12 702 - ammonia 14 - appears to be back to her baseline mental status  Acute respiratory failure with hypoxia felt to be due to hypoventilation - resolved - wean to room air  Atrial fibrillation  Chadsvasc is 6 - rate controlled - plan was to resumeapixaban 8/19w/ Ortho blessing - switched to IV heparin as was unable to take po - heparin stopped 822 due to concern for bleeding at surgical site - now on DOAC => will STOP Eliquis for now due to oozing from incision and thrombocytopenia  Acute urinary  retention foley placed 8/20 - urecholine started 8/23 - d/c foley today and watch for urinary retention recurrence   HTN BP reasonably controlled  Hx of stroke 2014 pt without difficulty in performing ADLs per baseline - 07/03/2017 CT brain negative for acute findings noting old right frontal lobe infarct  Hyponatremia chronic - may be reflective of a degree of modest volume overload, though pt has no hx of CHF - one time dose of lasix today -  This AM Na=130, likely due to lasix check cmp in am  Goal of care Dr. Arbutus Leas discussed with nephew (HPOA) who confirmed patient is DNR  DVT prophylaxis:  SCD Code Status: NO CODE - DNR Family Communication: no family present at time of exam  Disposition Plan: monitor for bleeding at surgical site - d/c foley - CT of hip to r/o abscess or seroma formation - UA and blood cx in setting of climbing WBC - possible d/c to SNF 8/25 if all issues stable/resolved (if not will need to be delayed)   Consultants:  Ortho Romeo Apple  Antimicrobials:  none    Lab Results  Component Value Date   PLT 38 (L) 07/12/2017      Anti-infectives    Start     Dose/Rate Route Frequency Ordered Stop   07/05/17 1400  ceFAZolin (ANCEF) IVPB 2g/100 mL premix     2 g 200 mL/hr over 30 Minutes Intravenous Every 6 hours 07/05/17 1114 07/05/17 2222   07/05/17 0600  ceFAZolin (ANCEF) IVPB 2g/100 mL premix     2 g 200 mL/hr over 30 Minutes Intravenous On call to O.R. 07/04/17 1639 07/05/17 0759        Objective:   Vitals:   07/12/17 1056  07/12/17 1300 07/12/17 2135 07/13/17 0557  BP: 128/69 (!) 135/57 130/61 128/87  Pulse: 89 96 87 92  Resp: 18 18 18 18   Temp: 98.3 F (36.8 C) 98.5 F (36.9 C) 98.4 F (36.9 C) 98.9 F (37.2 C)  TempSrc: Oral Oral Oral Oral  SpO2: (!) 9% 95% 94% 93%  Weight:      Height:        Wt Readings from Last 3 Encounters:  07/03/17 48.4 kg (106 lb 12.8 oz)  09/11/16 50.2 kg (110 lb 9.6 oz)  07/25/16 49.9 kg (110 lb)       Intake/Output Summary (Last 24 hours) at 07/13/17 0610 Last data filed at 07/13/17 0558  Gross per 24 hour  Intake              120 ml  Output             1500 ml  Net            -1380 ml     Physical Exam  Awake Alert, Oriented X 3, No new F.N deficits, Normal affect [place].AT,PERRAL Supple Neck,No JVD, No cervical lymphadenopathy appriciated.  Symmetrical Chest wall movement, Good air movement bilaterally, CTAB RRR,No Gallops,Rubs or new Murmurs, No Parasternal Heave +ve B.Sounds, Abd Soft, No tenderness, No organomegaly appriciated, No rebound - guarding or rigidity. No Cyanosis, Clubbing or edema, No new Rash or bruise  Incision c/d/i,  Slight oozing of blood noted by RN    Data Review:    CBC  Recent Labs Lab 07/09/17 0602 07/10/17 0542 07/10/17 1853 07/11/17 0513 07/12/17 0554  WBC 8.4 8.7 9.4 13.1* 15.4*  HGB 7.4* 6.9* 8.9* 9.1* 9.6*  HCT 22.0* 21.2* 26.6* 26.8* 28.1*  PLT 123* 67* 48* 45* 38*  MCV 97.3 101.4* 97.1 96.4 95.9  MCH 32.7 33.0 32.5 32.7 32.8  MCHC 33.6 32.5 33.5 34.0 34.2  RDW 14.0 15.4 16.5* 17.4* 17.1*    Chemistries   Recent Labs Lab 07/08/17 0614 07/09/17 0602 07/10/17 0542 07/11/17 0513 07/12/17 0554  NA 134* 138 142 134* 130*  K 4.0 4.2 4.2 3.8 3.6  CL 93* 101 108 101 97*  CO2 28 32 29 28 27   GLUCOSE 183* 131* 103* 100* 104*  BUN 57* 52* 33* 23* 21*  CREATININE 1.33* 1.04* 0.75 0.72 0.65  CALCIUM 9.3 8.7* 8.2* 8.1* 8.0*  MG  --   --  1.8  --   --   AST 68* 47*  --  34  --   ALT 26 22  --  17  --   ALKPHOS 32* 27*  --  30*  --   BILITOT 0.9 0.9  --  1.5*  --    ------------------------------------------------------------------------------------------------------------------ No results for input(s): CHOL, HDL, LDLCALC, TRIG, CHOLHDL, LDLDIRECT in the last 72 hours.  Lab Results  Component Value Date   HGBA1C 6.3 (H) 11/21/2015    ------------------------------------------------------------------------------------------------------------------ No results for input(s): TSH, T4TOTAL, T3FREE, THYROIDAB in the last 72 hours.  Invalid input(s): FREET3 ------------------------------------------------------------------------------------------------------------------ No results for input(s): VITAMINB12, FOLATE, FERRITIN, TIBC, IRON, RETICCTPCT in the last 72 hours.  Coagulation profile No results for input(s): INR, PROTIME in the last 168 hours.  No results for input(s): DDIMER in the last 72 hours.  Cardiac Enzymes No results for input(s): CKMB, TROPONINI, MYOGLOBIN in the last 168 hours.  Invalid input(s): CK ------------------------------------------------------------------------------------------------------------------ No results found for: BNP  Inpatient Medications  Scheduled Meds: . apixaban  2.5 mg Oral BID  . bethanechol  10 mg Oral TID  . carbamide peroxide  5 drop Both EARS Weekly  . feeding supplement  1 Container Oral TID BM  . feeding supplement (PRO-STAT SUGAR FREE 64)  30 mL Oral BID  . mouth rinse  15 mL Mouth Rinse BID  . metoprolol tartrate  25 mg Oral BID  . multivitamin with minerals  1 tablet Oral Daily   Continuous Infusions: PRN Meds:.acetaminophen **OR** acetaminophen, fentaNYL (SUBLIMAZE) injection, HYDROcodone-acetaminophen, menthol-cetylpyridinium **OR** phenol, ondansetron (ZOFRAN) IV, polyethylene glycol  Micro Results Recent Results (from the past 240 hour(s))  Surgical PCR screen     Status: None   Collection Time: 07/03/17  6:00 PM  Result Value Ref Range Status   MRSA, PCR NEGATIVE NEGATIVE Final   Staphylococcus aureus NEGATIVE NEGATIVE Final    Comment:        The Xpert SA Assay (FDA approved for NASAL specimens in patients over 61 years of age), is one component of a comprehensive surveillance program.  Test performance has been validated by [facility] for  patients greater than or equal to 57 year old. It is not intended to diagnose infection nor to guide or monitor treatment.   Urine Culture     Status: None   Collection Time: 07/08/17 10:43 AM  Result Value Ref Range Status   Specimen Description URINE, CLEAN CATCH  Final   Special Requests NONE  Final   Culture   Final    NO GROWTH Performed at [facility], [address], [place], [place] [address]    Report Status 07/10/2017 FINAL  Final  Culture, blood (routine x 2)     Status: None (Preliminary result)   Collection Time: 07/12/17  4:32 PM  Result Value Ref Range Status   Specimen Description RIGHT ANTECUBITAL  Final   Special Requests Blood Culture adequate volume  Final   Culture NO GROWTH < 12 HOURS  Final   Report Status PENDING  Incomplete  Culture, blood (routine x 2)     Status: None (Preliminary result)   Collection Time: 07/12/17  4:50 PM  Result Value Ref Range Status   Specimen Description BLOOD PICC LINE DRAW  Final   Special Requests Blood Culture adequate volume  Final   Culture NO GROWTH < 12 HOURS  Final   Report Status PENDING  Incomplete    Radiology Reports Ct Abdomen Pelvis Wo Contrast  Result Date: 07/07/2017 CLINICAL DATA:  Inpatient. Abdominal distention and pain. Recent mechanical fall with right hip fracture and subsequent right hip arthroplasty. History of hysterectomy. EXAM: CT ABDOMEN AND PELVIS WITHOUT CONTRAST TECHNIQUE: Multidetector CT imaging of the abdomen and pelvis was performed following the standard protocol without IV contrast. COMPARISON:  02/12/2015 CT abdomen/ pelvis. Abdominal radiograph from earlier today. FINDINGS: Lower chest: Small dependent bilateral pleural effusions with associated mild-to-moderate compressive atelectasis in the dependent lower lobes. Mild patchy tree-in-bud opacities and ground-glass opacities in the bilateral lower lobes. Coronary atherosclerosis. Oral contrast in the lower thoracic esophagus.  Hepatobiliary: Two simple liver cysts, largest 4.2 cm centrally. No additional liver lesions. Normal liver size. Normal gallbladder with no radiopaque cholelithiasis. No definite biliary ductal dilatation. Common bile duct diameter 7 mm, probably normal for age. Pancreas: Normal, with no mass or duct dilation. Spleen: Normal size. No mass. Adrenals/Urinary Tract: Normal adrenals. No renal stones. No hydronephrosis. No contour deforming renal masses. Limited visualization of the mildly distended bladder due to streak artifact from the right hip hardware, with no gross bladder abnormality.  Stomach/Bowel: Small to moderate hiatal hernia. There is moderate gastric distension by oral contrast. Well-positioned enteric tube with tip in the gastric fundus. No gastric wall thickening. There is diffuse mild small bowel dilatation up to 3.5 cm diameter with scattered fluid levels in the small bowel. No small bowel wall thickening or focal small bowel caliber transition. Normal appendix. Mild diffuse distention of the colon with gas and stool. No large bowel wall thickening or pericolonic fat stranding. Vascular/Lymphatic: Atherosclerotic nonaneurysmal abdominal aorta. No pathologically enlarged lymph nodes in the abdomen or pelvis. Reproductive: Status post hysterectomy, with no abnormal findings at the vaginal cuff. No adnexal mass. Other: No pneumoperitoneum, ascites or focal fluid collection. Musculoskeletal: No aggressive appearing focal osseous lesions. Marked multilevel thoracolumbar degenerative disc disease. Partially visualized right hip hemiarthroplasty. Skin staples are seen lateral to the right hip. Expected postoperative soft tissue gas surrounding the right hip. IMPRESSION: 1. Mild diffuse small and large bowel dilatation with scattered small bowel fluid levels, compatible with postoperative adynamic ileus. No free air. 2. Small to moderate hiatal hernia. Oral contrast in the lower thoracic esophagus, suggesting  esophageal dysmotility and/or gastroesophageal reflux. 3. Mild patchy tree-in-bud and ground-glass opacities at the lung bases, suggesting mild aspiration. 4. Small dependent bilateral pleural effusions with mild dependent bibasilar atelectasis. 5.  Aortic Atherosclerosis (ICD10-I70.0).  Coronary atherosclerosis. Electronically Signed   By: Delbert Phenix M.D.   On: 07/07/2017 18:13   Dg Chest 1 View  Result Date: 07/08/2017 CLINICAL DATA:  83 year old female with shortness of breath. Pleural effusions. Recent hip surgery. EXAM: CHEST 1 VIEW COMPARISON:  CT Abdomen and Pelvis 07/07/17. Chest radiograph 07/03/2017. FINDINGS: AP semi upright view at 1423 hours. The patient is rotated to the left. NG tube in place, appears to be looped in the stomach similar to the configuration yesterday. Small bilateral pleural effusions were better demonstrated on the recent CT. Moderate size gastric hiatal hernia, stable mediastinal contours. Calcified aortic atherosclerosis. No pneumothorax, pulmonary edema or other confluent pulmonary opacity. IMPRESSION: Chest appears stable since the CT abdomen yesterday. Small pleural effusions. Stable NG tube. Electronically Signed   By: Odessa Fleming M.D.   On: 07/08/2017 14:49   Dg Chest 1 View  Result Date: 07/03/2017 CLINICAL DATA:  Hip fracture EXAM: CHEST 1 VIEW COMPARISON:  11/21/2015 FINDINGS: COPD with hyperinflation. Extensive apical scarring bilaterally unchanged. Negative for heart failure or pneumonia. Negative for pleural effusion. IMPRESSION: COPD with apical scarring. No acute abnormality and unchanged from the prior study. Electronically Signed   By: Marlan Palau M.D.   On: 07/03/2017 12:01   Dg Abd 1 View  Result Date: 07/07/2017 CLINICAL DATA:  NG tube placement. EXAM: ABDOMEN - 1 VIEW COMPARISON:  July 06, 2017 FINDINGS: An NG tube terminates in the left upper quadrant. The air-fluid level in the stomach is no longer seen. An air-filled structure left upper quadrant  is likely mildly distended stomach. No obstruction or definitive ileus. IMPRESSION: 1. The NG tube is in good position. The air-fluid level in the stomach seen previously is no longer visualized. The stomach may remain mildly distended. 2. No obstruction or definitive ileus. Electronically Signed   By: Gerome Sam III M.D   On: 07/07/2017 07:25   Ct Head Wo Contrast  Result Date: 07/08/2017 CLINICAL DATA:  83 year old female status post fall onto right side during exercise class. Altered mental status. EXAM: CT HEAD WITHOUT CONTRAST TECHNIQUE: Contiguous axial images were obtained from the base of the skull through the vertex without  intravenous contrast. COMPARISON:  Head CT 07/03/2017, brain MRI 11/21/2015, and earlier. FINDINGS: Brain: Chronic infarct with encephalomalacia in the right superior frontal gyrus. Stable gray-white matter differentiation throughout the brain. No midline shift, ventriculomegaly, mass effect, evidence of mass lesion, intracranial hemorrhage or evidence of cortically based acute infarction. No other cortical encephalomalacia identified. Vascular: Calcified atherosclerosis at the skull base. Skull: Stable and intact. Sinuses/Orbits: Left side in G-tube in place. Visualized paranasal sinuses and mastoids are stable and well pneumatized. Other: No acute orbit or scalp soft tissue findings. IMPRESSION: No acute intracranial abnormality. No acute traumatic injury identified. Stable non contrast CT appearance of the brain, including chronic right MCA infarct. Electronically Signed   By: Odessa Fleming M.D.   On: 07/08/2017 14:46   Ct Head Wo Contrast  Result Date: 07/03/2017 CLINICAL DATA:  Fall at aerobics class. EXAM: CT HEAD WITHOUT CONTRAST TECHNIQUE: Contiguous axial images were obtained from the base of the skull through the vertex without intravenous contrast. COMPARISON:  11/21/2015 FINDINGS: Brain: No evidence of acute infarction, hemorrhage, hydrocephalus, extra-axial collection  or mass lesion/mass effect. Chronic right frontal lobe encephalomalacia identified There is mild diffuse low-attenuation within the subcortical and periventricular white matter compatible with chronic microvascular disease. Prominence of the sulci and ventricles noted. Vascular: No hyperdense vessel or unexpected calcification. Skull: Normal. Negative for fracture or focal lesion. Sinuses/Orbits: No acute finding. Other: None. IMPRESSION: 1. No acute intracranial abnormality. 2. Chronic small vessel ischemic disease and brain atrophy. 3. Chronic right frontal lobe infarct. Electronically Signed   By: Signa Kell M.D.   On: 07/03/2017 20:29   US Renal  Result Date: 07/08/2017 CLINICAL DATA:  Acute kidney injury. EXAM: RENAL / URINARY TRACT ULTRASOUND COMPLETE COMPARISON:  None. FINDINGS: Right Kidney: Length: 9.0 cm. Echogenicity within normal limits. No mass or hydronephrosis visualized. Left Kidney: Length: 7.8 cm. Echogenicity within normal limits. No mass or hydronephrosis visualized. Bladder: The bladder is empty with a Foley catheter in place. Incidental note is made of right pleural effusion and liver cysts as demonstrated on prior CT scan. IMPRESSION: Normal appearing kidneys bilaterally. Electronically Signed   By: Francene Boyers M.D.   On: 07/08/2017 15:12   Pelvis Portable  Result Date: 07/05/2017 CLINICAL DATA:  Postop hip fracture EXAM: PORTABLE PELVIS 1-2 VIEWS COMPARISON:  07/03/2017 FINDINGS: Post right hip replacement. Normal AP alignment. No hardware or bony complicating feature. Mild degenerative changes in the left hip joint. IMPRESSION: Right hip replacement.  No complicating feature. Electronically Signed   By: Charlett Nose M.D.   On: 07/05/2017 10:42   Dg Chest Port 1 View  Result Date: 07/09/2017 CLINICAL DATA:  Check nasogastric catheter placement EXAM: PORTABLE CHEST 1 VIEW COMPARISON:  07/08/2017 FINDINGS: Nasogastric catheter remains coiled within the stomach. Cardiac shadow  is stable. Stable hiatal hernia. Left-sided PICC line is noted in satisfactory position. Small left pleural effusion is again noted. IMPRESSION: Nasogastric catheter remains in the stomach. Remainder the exam is stable. Electronically Signed   By: Alcide Clever M.D.   On: 07/09/2017 10:44   Dg Abd 2 Views  Result Date: 07/06/2017 CLINICAL DATA:  Abdominal distention. EXAM: ABDOMEN - 2 VIEW COMPARISON:  February 14, 2015 FINDINGS: There is an air-fluid level in the left upper quadrant. Statistically, this is most likely to be within a distended stomach. No air under the right hemidiaphragm. Probable hiatal hernia. Lung bases otherwise normal. Rounded density over the left mid abdomen. No bowel obstruction. No other acute abnormalities. Scoliotic and degenerative  changes of the spine. IMPRESSION: 1. Air-fluid level over the left upper quadrant is statistically most likely to represent a stomach distended with gas and fluid. No free air as right hemidiaphragm. 2. There is a rounded density over the left mid abdomen on one view which is nonspecific. This could be artifactual or something on the patient. This was not seen previously. Recommend attention on follow-up. 3. No other acute abnormalities. Electronically Signed   By: Gerome Sam III M.D   On: 07/06/2017 13:35   Dg Hip Unilat With Pelvis 2-3 Views Right  Result Date: 07/03/2017 CLINICAL DATA:  Fall.  Right hip pain EXAM: DG HIP (WITH OR WITHOUT PELVIS) 2-3V RIGHT COMPARISON:  None. FINDINGS: Right femoral neck fracture with angulation and impaction. Fracture is in the subcapital region of the femoral neck. Calcification of the glenoid labrum. Joint space normal. No other pelvic fracture. Advanced disc degeneration L5-S1 IMPRESSION: Subcapital angulated fracture of the right femur. Electronically Signed   By: Marlan Palau M.D.   On: 07/03/2017 12:03    Time Spent in minutes  30   Pearson Grippe M.D on 07/13/2017 at 6:10 AM  Between 7am to 7pm - Pager  - [phone]  After 7pm go to [email or web address] - password [facility]  [facility] -  Office  [phone]

## 2017-07-13 NOTE — Progress Notes (Signed)
Physical Therapy Treatment Patient Details Name: LOYAL RUDY MRN: [id number] DOB: Nov 23, 1920 Today's Date: 07/13/2017    History of Present Illness Danesha LINNIE DELGRANDE is a 83 y.o. female with medical history of stroke, essential hypertension, hyperlipidemia, and atrial fibrillation presented with a mechanical fall onto her right side while at an exercise dance class on the morning of 07/03/2017. The patient states that her feet got tangled up and resulted in a mechanical fall onto her right side. Because she had immense pain, and she was not able to get up, EMS was activated. Patient denies fevers, chills, headache, chest pain, dyspnea, nausea, vomiting, diarrhea, abdominal pain, dysuria, hematuria, hematochezia, and melena.    PT Comments    PT continues to progress slowly. Main limitation at this time is fatigue.  Pt will continue to improve functionally with continued skilled physical therapy   Follow Up Recommendations  SNF     Equipment Recommendations  None recommended by PT    Recommendations for Other Services       Precautions / Restrictions Precautions Precautions: Fall Restrictions Weight Bearing Restrictions: Yes RLE Weight Bearing: Weight bearing as tolerated    Mobility  Bed Mobility Overal bed mobility: Needs Assistance Bed Mobility: Supine to Sit Rolling: Min assist            Transfers Overall transfer level: Needs assistance Equipment used: Rolling walker (2 wheeled) Transfers: Sit to/from Stand Sit to Stand: Min guard Stand pivot transfers: Min assist          Ambulation/Gait Ambulation/Gait assistance: Supervision Ambulation Distance (Feet): 18 Feet Assistive device: Rolling walker (2 wheeled) Gait Pattern/deviations: WFL(Within Functional Limits)         Stairs            Wheelchair Mobility    Modified Rankin (Stroke Patients Only)       Balance                                            Cognition  Arousal/Alertness: Awake/alert Behavior During Therapy: WFL for tasks assessed/performed Overall Cognitive Status: Within Functional Limits for tasks assessed                                        Exercises Total Joint Exercises Ankle Circles/Pumps: 10 reps Quad Sets: 10 reps Gluteal Sets: 10 reps Towel Squeeze: 10 reps Short Arc Quad: 10 reps Heel Slides: 10 reps Hip ABduction/ADduction: 10 reps         PT Goals (current goals can now be found in the care plan section) Acute Rehab PT Goals PT Goal Formulation: With patient Potential to Achieve Goals: Good Progress towards PT goals: Progressing toward goals    Frequency    Min 3X/week      PT Plan Current plan remains appropriate    Co-evaluation              AM-PAC PT "6 Clicks" Daily Activity  Outcome Measure  Difficulty turning over in bed (including adjusting bedclothes, sheets and blankets)?: Unable Difficulty moving from lying on back to sitting on the side of the bed? : Unable Difficulty sitting down on and standing up from a chair with arms (e.g., wheelchair, bedside commode, etc,.)?: Unable Help needed moving to and from a bed to chair (including  a wheelchair)?: A Lot Help needed walking in hospital room?: A Lot Help needed climbing 3-5 steps with a railing? : Total 6 Click Score: 8    End of Session Equipment Utilized During Treatment: Gait belt Activity Tolerance: Patient limited by fatigue Patient left: with call bell/phone within reach;in chair Nurse Communication: Mobility status PT Visit Diagnosis: Unsteadiness on feet (R26.81);Other abnormalities of gait and mobility (R26.89);Muscle weakness (generalized) (M62.81);History of falling (Z91.81)     Time: 1013-1101 PT Time Calculation (min) (ACUTE ONLY): 48 min  Charges:  $Gait Training: 8-22 mins $Therapeutic Exercise: 8-22 mins          Virgina Organ, PT CLT [phone] 07/13/2017, 11:01 AM

## 2017-07-14 DIAGNOSIS — D7582 Heparin induced thrombocytopenia (HIT): Secondary | ICD-10-CM

## 2017-07-14 DIAGNOSIS — Z7901 Long term (current) use of anticoagulants: Secondary | ICD-10-CM

## 2017-07-14 LAB — CBC
HCT: 24.4 % — ABNORMAL LOW (ref 36.0–46.0)
HEMOGLOBIN: 8.2 g/dL — AB (ref 12.0–15.0)
MCH: 33.2 pg (ref 26.0–34.0)
MCHC: 33.6 g/dL (ref 30.0–36.0)
MCV: 98.8 fL (ref 78.0–100.0)
Platelets: 120 10*3/uL — ABNORMAL LOW (ref 150–400)
RBC: 2.47 MIL/uL — AB (ref 3.87–5.11)
RDW: 18.3 % — ABNORMAL HIGH (ref 11.5–15.5)
WBC: 16.4 10*3/uL — ABNORMAL HIGH (ref 4.0–10.5)

## 2017-07-14 LAB — DIC (DISSEMINATED INTRAVASCULAR COAGULATION) PANEL
D DIMER QUANT: 6.83 ug{FEU}/mL — AB (ref 0.00–0.50)
INR: 1.1
PLATELETS: 34 10*3/uL — AB (ref 150–400)
SMEAR REVIEW: NONE SEEN

## 2017-07-14 LAB — DIC (DISSEMINATED INTRAVASCULAR COAGULATION)PANEL
Fibrinogen: 199 mg/dL — ABNORMAL LOW (ref 210–475)
Prothrombin Time: 14.3 seconds (ref 11.4–15.2)
aPTT: 28 seconds (ref 24–36)

## 2017-07-14 LAB — PLATELET COUNT: Platelets: 149 10*3/uL — ABNORMAL LOW (ref 150–400)

## 2017-07-14 MED ORDER — SODIUM CHLORIDE 0.9% FLUSH
10.0000 mL | INTRAVENOUS | Status: DC | PRN
Start: 2017-07-14 — End: 2017-07-16
  Administered 2017-07-16: 10 mL
  Filled 2017-07-14: qty 40

## 2017-07-14 MED ORDER — SODIUM CHLORIDE 0.9% FLUSH
10.0000 mL | Freq: Two times a day (BID) | INTRAVENOUS | Status: DC
  Administered 2017-07-14 – 2017-07-16 (×4): 10 mL

## 2017-07-14 NOTE — Progress Notes (Signed)
Report called to [employer]

## 2017-07-14 NOTE — Consult Note (Signed)
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 -- [facility] [facility] and [facility] and had a right femoral neck fracture. She underwent right hip bipolar reconstruction under Arther Abbott 07/05/2017.  Her apixaban was resumed in 07/07/2017, but on 07/08/2017 she was switched to intravenous heparin as the patient had an ileus and some confusion and was unable to take by mouth medication reliably.  The platelet count, initially normal, had dropped to less than one half baseline by 07/10/2017.  Results for Theresa Kennedy (MRN [id number]) as of 07/14/2017 11:16  Ref. Range 07/07/2017 06:10 07/08/2017 06:14 07/09/2017 06:02 07/10/2017 05:42 07/10/2017 18:53 07/11/2017 05:13 07/12/2017 05:54 07/13/2017 06:53 07/13/2017 20:46 07/13/2017 23:16 07/14/2017 10:30  Platelets Latest Ref Range: 150 - 400 K/uL 160 177 123 (L) 67 (L) 48 (L) 45 (L) 38 (L) 28 (LL) 11 (LL) 34 (L) 149 (L)   On 07/10/2017 a HIT screen returned positive at 2.283, and this was confirmed with a serotonin release assay positive at 59. Her anticoagulation was discontinued on the same day and heparin was added as an "allergy" on 07/12/2017, presumably the last day any flush may have been used.  The patient's platelet count continued to drop to a low of 11,000 07/13/2017. She was having oozing from her wound. It was felt prudent, despite the risk of clotting, to proceed to transfusion, which was done. Very likely her counts were already on the way to recovery since the DIC panel obtained late on the  same day showed a platelet count of 34,000. Current posttransfusion level is back in the normal range  INTERVAL HISTORY: I met with the patient in her room, accompanied by her nephew and health care power of attorney, [name]   REVIEW OF SYSTEMS: Theresa Kennedy tells me she is tired. She is not hurting. She is not aware of any bleeding currently. She has a significant bruise in her right lateral chest wall secondary to her fall and anticoagulation. She denies unusual headaches, visual changes, nausea, vomiting, or dizziness. The encephalopathy she experienced earlier in her hospitalization as entirely resolved. She admits to a minimal cough at times, with minimal clear phlegm production. She had a normal bowel movement yesterday.  PAST MEDICAL HISTORY: Past Medical History:  Diagnosis Date  . Atrial fibrillation ([place])   . GERD (gastroesophageal reflux disease)   . Hypertension   . Stroke [facility])     PAST SURGICAL HISTORY: Past Surgical History:  Procedure Laterality Date  . ABDOMINAL HYSTERECTOMY    . HIP ARTHROPLASTY Right 07/05/2017   Procedure: ARTHROPLASTY BIPOLAR HIP (HEMIARTHROPLASTY);  Surgeon: Carole Civil, MD;  Location: [facility];  Service: Orthopedics;  Laterality: Right;    FAMILY HISTORY Family History  Problem Relation Age of Onset  . Stroke Mother   . Leukemia Brother   . Hypertension Father   . Breast cancer Sister     GYNECOLOGIC HISTORY:  No LMP recorded. Patient is postmenopausal. She is GX P0  SOCIAL HISTORY: The patient is single, lives independently, by herself, with no pets.   ADVANCED DIRECTIVES: The patient's nephew, [name], is her  healthcare power of attorney   HEALTH MAINTENANCE: Social History  Substance Use Topics  . Smoking status: Never Smoker  . Smokeless tobacco: Never Used  . Alcohol use No      Allergies  Allergen Reactions  . Heparin     HIT positive  . Sulfa Antibiotics Other (See Comments)    unknown    Current  Facility-Administered Medications  Medication Dose Route Frequency Provider Last Rate Last Dose  . acetaminophen (TYLENOL) tablet 650 mg  650 mg Oral Q6H PRN Carole Civil, MD       Or  . acetaminophen (TYLENOL) suppository 650 mg  650 mg Rectal Q6H PRN Carole Civil, MD      . bethanechol (URECHOLINE) tablet 10 mg  10 mg Oral TID Cherene Altes, MD   10 mg at 07/13/17 2140  . carbamide peroxide (DEBROX) 6.5 % OTIC (EAR) solution 5 drop  5 drop Both EARS Weekly Tat, David, MD   5 drop at 07/14/17 0028  . cefTRIAXone (ROCEPHIN) 1 g in dextrose 5 % 50 mL IVPB  1 g Intravenous Q24H Jani Gravel, MD   Stopped at 07/14/17 2683  . feeding supplement (BOOST / RESOURCE BREEZE) liquid 1 Container  1 Container Oral TID BM Cherene Altes, MD   1 Container at 07/13/17 2000  . feeding supplement (PRO-STAT SUGAR FREE 64) liquid 30 mL  30 mL Oral BID Cherene Altes, MD   30 mL at 07/13/17 2139  . fentaNYL (SUBLIMAZE) injection 12.5 mcg  12.5 mcg Intravenous Q2H PRN Orson Eva, MD   12.5 mcg at 07/09/17 0815  . HYDROcodone-acetaminophen (NORCO/VICODIN) 5-325 MG per tablet 1-2 tablet  1-2 tablet Oral Q6H PRN Orson Eva, MD   2 tablet at 07/06/17 0601  . MEDLINE mouth rinse  15 mL Mouth Rinse BID Tat, David, MD   15 mL at 07/13/17 2200  . menthol-cetylpyridinium (CEPACOL) lozenge 3 mg  1 lozenge Oral PRN Carole Civil, MD       Or  . phenol (CHLORASEPTIC) mouth spray 1 spray  1 spray Mouth/Throat PRN Carole Civil, MD      . metoprolol tartrate (LOPRESSOR) tablet 25 mg  25 mg Oral BID Cherene Altes, MD   25 mg at 07/13/17 2203  . multivitamin with minerals tablet 1 tablet  1 tablet Oral Daily Cherene Altes, MD   1 tablet at 07/13/17 0911  . ondansetron (ZOFRAN) injection 4 mg  4 mg Intravenous Q6H PRN Orson Eva, MD   4 mg at 07/06/17 0602  . polyethylene glycol (MIRALAX / GLYCOLAX) packet 17 g  17 g Oral Daily PRN Orson Eva, MD   17 g at 07/06/17 4196  . sodium  chloride flush (NS) 0.9 % injection 10-40 mL  10-40 mL Intracatheter Q12H Mariel Aloe, MD      . sodium chloride flush (NS) 0.9 % injection 10-40 mL  10-40 mL Intracatheter PRN Mariel Aloe, MD        OBJECTIVE: Elderly white woman examined in bed  Vitals:   07/14/17 0528 07/14/17 0922  BP: (!) 135/38 (!) 121/41  Pulse: 80 97  Resp: 16 18  Temp: (!) 97.5 F (36.4 C) 97.6 F (36.4 C)  SpO2: 100% 97%     Body mass index is 20.19 kg/m.  Wt Readings from Last 3 Encounters:  07/14/17 113 lb 15.7 oz (51.7 kg)  09/11/16 110 lb 9.6 oz (50.2 kg)  07/25/16 110 lb (49.9  kg)      ECOG FS:3 - Symptomatic, >50% confined to bed  Sclerae unicteric, EOMs intact Oropharynx clear and moist No cervical or supraclavicular adenopathy Lungs no rales or rhonchi, auscultated anterolaterally Heart regular rate and rhythm Abd soft, nontender, positive bowel sounds MSK large right ecchymosis in the lateral chest wall; the right hip incision is bandaged over, the bandage being clean and dry and there is no associated erythema or swelling Neuro: nonfocal, well oriented, appropriate affect Breasts: Deferred     LAB RESULTS:  CMP     Component Value Date/Time   NA 133 (L) 07/13/2017 0653   K 3.4 (L) 07/13/2017 0653   CL 97 (L) 07/13/2017 0653   CO2 30 07/13/2017 0653   GLUCOSE 111 (H) 07/13/2017 0653   BUN 22 (H) 07/13/2017 0653   CREATININE 0.75 07/13/2017 0653   CALCIUM 8.1 (L) 07/13/2017 0653   PROT 4.5 (L) 07/11/2017 0513   PROT 6.1 03/06/2016 0945   ALBUMIN 2.9 (L) 07/11/2017 0513   ALBUMIN 4.1 03/06/2016 0945   AST 34 07/11/2017 0513   ALT 17 07/11/2017 0513   ALKPHOS 30 (L) 07/11/2017 0513   BILITOT 1.5 (H) 07/11/2017 0513   BILITOT 0.4 03/06/2016 0945   GFRNONAA >60 07/13/2017 0653   GFRAA >60 07/13/2017 0653    No results found for: TOTALPROTELP, ALBUMINELP, A1GS, A2GS, BETS, BETA2SER, GAMS, MSPIKE, SPEI  No results found for: KPAFRELGTCHN, LAMBDASER,  KAPLAMBRATIO  Lab Results  Component Value Date   WBC 21.9 (H) 07/13/2017   NEUTROABS 5.9 07/03/2017   HGB 11.5 (L) 07/13/2017   HCT 34.6 (L) 07/13/2017   MCV 99.7 07/13/2017   PLT 149 (L) 07/14/2017    _0 @  No results found for: LABCA2  No components found for: [id number]   Recent Labs Lab 07/13/17 2316  INR 1.10    No results found for: LABCA2  No results found for: HAF790  No results found for: XYB338  No results found for: VAN191  No results found for: CA2729  No components found for: HGQUANT  No results found for: CEA1 / No results found for: CEA1   No results found for: AFPTUMOR  No results found for: [place]  No results found for: PSA1  Admission on 07/03/2017  No results displayed because visit has over 200 results.      (this displays the last labs from the last 3 days)  No results found for: TOTALPROTELP, ALBUMINELP, A1GS, A2GS, BETS, BETA2SER, GAMS, MSPIKE, SPEI (this displays SPEP labs)  No results found for: KPAFRELGTCHN, LAMBDASER, KAPLAMBRATIO (kappa/lambda light chains)  No results found for: HGBA, HGBA2QUANT, HGBFQUANT, HGBSQUAN (Hemoglobinopathy evaluation)   Lab Results  Component Value Date   LDH 263 (H) 07/13/2017    No results found for: IRON, TIBC, IRONPCTSAT (Iron and TIBC)  No results found for: FERRITIN  Urinalysis    Component Value Date/Time   COLORURINE YELLOW 07/13/2017 0530   APPEARANCEUR HAZY (A) 07/13/2017 0530   LABSPEC 1.005 07/13/2017 0530   PHURINE 6.0 07/13/2017 0530   GLUCOSEU NEGATIVE 07/13/2017 0530   HGBUR LARGE (A) 07/13/2017 0530   BILIRUBINUR NEGATIVE 07/13/2017 0530   KETONESUR NEGATIVE 07/13/2017 0530   PROTEINUR NEGATIVE 07/13/2017 0530   UROBILINOGEN 0.2 11/21/2014 1341   NITRITE NEGATIVE 07/13/2017 0530   LEUKOCYTESUR LARGE (A) 07/13/2017 0530     STUDIES: Ct Abdomen Pelvis Wo Contrast  Result Date: 07/07/2017 CLINICAL DATA:  Inpatient. Abdominal distention and  pain. Recent mechanical fall with right hip fracture and  subsequent right hip arthroplasty. History of hysterectomy. EXAM: CT ABDOMEN AND PELVIS WITHOUT CONTRAST TECHNIQUE: Multidetector CT imaging of the abdomen and pelvis was performed following the standard protocol without IV contrast. COMPARISON:  02/12/2015 CT abdomen/ pelvis. Abdominal radiograph from earlier today. FINDINGS: Lower chest: Small dependent bilateral pleural effusions with associated mild-to-moderate compressive atelectasis in the dependent lower lobes. Mild patchy tree-in-bud opacities and ground-glass opacities in the bilateral lower lobes. Coronary atherosclerosis. Oral contrast in the lower thoracic esophagus. Hepatobiliary: Two simple liver cysts, largest 4.2 cm centrally. No additional liver lesions. Normal liver size. Normal gallbladder with no radiopaque cholelithiasis. No definite biliary ductal dilatation. Common bile duct diameter 7 mm, probably normal for age. Pancreas: Normal, with no mass or duct dilation. Spleen: Normal size. No mass. Adrenals/Urinary Tract: Normal adrenals. No renal stones. No hydronephrosis. No contour deforming renal masses. Limited visualization of the mildly distended bladder due to streak artifact from the right hip hardware, with no gross bladder abnormality. Stomach/Bowel: Small to moderate hiatal hernia. There is moderate gastric distension by oral contrast. Well-positioned enteric tube with tip in the gastric fundus. No gastric wall thickening. There is diffuse mild small bowel dilatation up to 3.5 cm diameter with scattered fluid levels in the small bowel. No small bowel wall thickening or focal small bowel caliber transition. Normal appendix. Mild diffuse distention of the colon with gas and stool. No large bowel wall thickening or pericolonic fat stranding. Vascular/Lymphatic: Atherosclerotic nonaneurysmal abdominal aorta. No pathologically enlarged lymph nodes in the abdomen or pelvis. Reproductive:  Status post hysterectomy, with no abnormal findings at the vaginal cuff. No adnexal mass. Other: No pneumoperitoneum, ascites or focal fluid collection. Musculoskeletal: No aggressive appearing focal osseous lesions. Marked multilevel thoracolumbar degenerative disc disease. Partially visualized right hip hemiarthroplasty. Skin staples are seen lateral to the right hip. Expected postoperative soft tissue gas surrounding the right hip. IMPRESSION: 1. Mild diffuse small and large bowel dilatation with scattered small bowel fluid levels, compatible with postoperative adynamic ileus. No free air. 2. Small to moderate hiatal hernia. Oral contrast in the lower thoracic esophagus, suggesting esophageal dysmotility and/or gastroesophageal reflux. 3. Mild patchy tree-in-bud and ground-glass opacities at the lung bases, suggesting mild aspiration. 4. Small dependent bilateral pleural effusions with mild dependent bibasilar atelectasis. 5.  Aortic Atherosclerosis (ICD10-I70.0).  Coronary atherosclerosis. Electronically Signed   By: Ilona Sorrel M.D.   On: 07/07/2017 18:13   Dg Chest 1 View  Result Date: 07/08/2017 CLINICAL DATA:  83 year old female with shortness of breath. Pleural effusions. Recent hip surgery. EXAM: CHEST 1 VIEW COMPARISON:  CT Abdomen and Pelvis 07/07/17. Chest radiograph 07/03/2017. FINDINGS: AP semi upright view at 1423 hours. The patient is rotated to the left. NG tube in place, appears to be looped in the stomach similar to the configuration yesterday. Small bilateral pleural effusions were better demonstrated on the recent CT. Moderate size gastric hiatal hernia, stable mediastinal contours. Calcified aortic atherosclerosis. No pneumothorax, pulmonary edema or other confluent pulmonary opacity. IMPRESSION: Chest appears stable since the CT abdomen yesterday. Small pleural effusions. Stable NG tube. Electronically Signed   By: Genevie Ann M.D.   On: 07/08/2017 14:49   Dg Chest 1 View  Result Date:  07/03/2017 CLINICAL DATA:  Hip fracture EXAM: CHEST 1 VIEW COMPARISON:  11/21/2015 FINDINGS: COPD with hyperinflation. Extensive apical scarring bilaterally unchanged. Negative for heart failure or pneumonia. Negative for pleural effusion. IMPRESSION: COPD with apical scarring. No acute abnormality and unchanged from the prior study. Electronically Signed   By:  Franchot Gallo M.D.   On: 07/03/2017 12:01   Dg Abd 1 View  Result Date: 07/07/2017 CLINICAL DATA:  NG tube placement. EXAM: ABDOMEN - 1 VIEW COMPARISON:  July 06, 2017 FINDINGS: An NG tube terminates in the left upper quadrant. The air-fluid level in the stomach is no longer seen. An air-filled structure left upper quadrant is likely mildly distended stomach. No obstruction or definitive ileus. IMPRESSION: 1. The NG tube is in good position. The air-fluid level in the stomach seen previously is no longer visualized. The stomach may remain mildly distended. 2. No obstruction or definitive ileus. Electronically Signed   By: Dorise Bullion III M.D   On: 07/07/2017 07:25   Ct Head Wo Contrast  Result Date: 07/08/2017 CLINICAL DATA:  83 year old female status post fall onto right side during exercise class. Altered mental status. EXAM: CT HEAD WITHOUT CONTRAST TECHNIQUE: Contiguous axial images were obtained from the base of the skull through the vertex without intravenous contrast. COMPARISON:  Head CT 07/03/2017, brain MRI 11/21/2015, and earlier. FINDINGS: Brain: Chronic infarct with encephalomalacia in the right superior frontal gyrus. Stable gray-white matter differentiation throughout the brain. No midline shift, ventriculomegaly, mass effect, evidence of mass lesion, intracranial hemorrhage or evidence of cortically based acute infarction. No other cortical encephalomalacia identified. Vascular: Calcified atherosclerosis at the skull base. Skull: Stable and intact. Sinuses/Orbits: Left side in G-tube in place. Visualized paranasal sinuses and  mastoids are stable and well pneumatized. Other: No acute orbit or scalp soft tissue findings. IMPRESSION: No acute intracranial abnormality. No acute traumatic injury identified. Stable non contrast CT appearance of the brain, including chronic right MCA infarct. Electronically Signed   By: Genevie Ann M.D.   On: 07/08/2017 14:46   Ct Head Wo Contrast  Result Date: 07/03/2017 CLINICAL DATA:  Fall at aerobics class. EXAM: CT HEAD WITHOUT CONTRAST TECHNIQUE: Contiguous axial images were obtained from the base of the skull through the vertex without intravenous contrast. COMPARISON:  11/21/2015 FINDINGS: Brain: No evidence of acute infarction, hemorrhage, hydrocephalus, extra-axial collection or mass lesion/mass effect. Chronic right frontal lobe encephalomalacia identified There is mild diffuse low-attenuation within the subcortical and periventricular white matter compatible with chronic microvascular disease. Prominence of the sulci and ventricles noted. Vascular: No hyperdense vessel or unexpected calcification. Skull: Normal. Negative for fracture or focal lesion. Sinuses/Orbits: No acute finding. Other: None. IMPRESSION: 1. No acute intracranial abnormality. 2. Chronic small vessel ischemic disease and brain atrophy. 3. Chronic right frontal lobe infarct. Electronically Signed   By: Kerby Moors M.D.   On: 07/03/2017 20:29   US Renal  Result Date: 07/08/2017 CLINICAL DATA:  Acute kidney injury. EXAM: RENAL / URINARY TRACT ULTRASOUND COMPLETE COMPARISON:  None. FINDINGS: Right Kidney: Length: 9.0 cm. Echogenicity within normal limits. No mass or hydronephrosis visualized. Left Kidney: Length: 7.8 cm. Echogenicity within normal limits. No mass or hydronephrosis visualized. Bladder: The bladder is empty with a Foley catheter in place. Incidental note is made of right pleural effusion and liver cysts as demonstrated on prior CT scan. IMPRESSION: Normal appearing kidneys bilaterally. Electronically Signed   By:  Lorriane Shire M.D.   On: 07/08/2017 15:12   Pelvis Portable  Result Date: 07/05/2017 CLINICAL DATA:  Postop hip fracture EXAM: PORTABLE PELVIS 1-2 VIEWS COMPARISON:  07/03/2017 FINDINGS: Post right hip replacement. Normal AP alignment. No hardware or bony complicating feature. Mild degenerative changes in the left hip joint. IMPRESSION: Right hip replacement.  No complicating feature. Electronically Signed   By: Lennette Bihari  [place] M.D.   On: 07/05/2017 10:42   Ct Femur Right Wo Contrast  Result Date: 07/13/2017 CLINICAL DATA:  Postop right hip arthroplasty for fracture 07/05/2017. Increasing wound drainage and leukocytosis. EXAM: CT OF THE LOWER RIGHT EXTREMITY WITHOUT CONTRAST TECHNIQUE: Multidetector CT imaging of the right thigh was performed according to the standard protocol. COMPARISON:  Radiographs 07/03/2017 and 07/05/2017. Pelvic CT 07/07/2017. FINDINGS: Bones/Joint/Cartilage Patient is status post right hip hemiarthroplasty. The prosthesis is well positioned. There is no evidence of acute fracture, dislocation or bone destruction. The prosthesis creates moderate beam hardening artifact, limiting evaluation of the immediate surrounding soft tissues. No large hip joint effusion identified. There are mild tricompartmental degenerative changes at the knee with meniscal chondrocalcinosis and a tiny Baker's cyst. There is no significant knee joint effusion. Ligaments Not relevant for examination. Muscles and Tendons Postsurgical changes within the right gluteus musculature. There is underlying muscular atrophy and enthesophyte formation, similar to recent CT. There is no focal intramuscular fluid collection or residual soft tissue emphysema. Enthesophytes are present within the proximal hamstring tendons. Soft tissues Deep to the skin staples laterally, there is a lenticular shaped subcutaneous fluid collection measuring up to 2.5 cm in thickness. The superior extent of this collection is not imaged on this  examination. Inferiorly, this extends to the level of the ischial tuberosity (greater than 14 cm cephalocaudad extent). This fluid is superficial to the iliotibial band, ill-defined and similar in size to the postoperative CT of 5 days ago. No other focal fluid collections are identified. There is mild subcutaneous edema throughout the proximal thigh. Mild femoral arterial atherosclerosis noted. IMPRESSION: 1. Subcutaneous fluid collection deep to the skin staples is similar in appearance to the postoperative CT of 5 days prior and nonspecific. This is likely a postoperative hematoma/seroma, although superimposed infection cannot be excluded. 2. No new or enlarging fluid collections or residual soft tissue emphysema. 3. No osseous complication following right hip hemiarthroplasty. No evidence of osteomyelitis. Electronically Signed   By: Richardean Sale M.D.   On: 07/13/2017 08:20   Dg Chest Port 1 View  Result Date: 07/09/2017 CLINICAL DATA:  Check nasogastric catheter placement EXAM: PORTABLE CHEST 1 VIEW COMPARISON:  07/08/2017 FINDINGS: Nasogastric catheter remains coiled within the stomach. Cardiac shadow is stable. Stable hiatal hernia. Left-sided PICC line is noted in satisfactory position. Small left pleural effusion is again noted. IMPRESSION: Nasogastric catheter remains in the stomach. Remainder the exam is stable. Electronically Signed   By: Inez Catalina M.D.   On: 07/09/2017 10:44   Dg Abd 2 Views  Result Date: 07/06/2017 CLINICAL DATA:  Abdominal distention. EXAM: ABDOMEN - 2 VIEW COMPARISON:  February 14, 2015 FINDINGS: There is an air-fluid level in the left upper quadrant. Statistically, this is most likely to be within a distended stomach. No air under the right hemidiaphragm. Probable hiatal hernia. Lung bases otherwise normal. Rounded density over the left mid abdomen. No bowel obstruction. No other acute abnormalities. Scoliotic and degenerative changes of the spine. IMPRESSION: 1.  Air-fluid level over the left upper quadrant is statistically most likely to represent a stomach distended with gas and fluid. No free air as right hemidiaphragm. 2. There is a rounded density over the left mid abdomen on one view which is nonspecific. This could be artifactual or something on the patient. This was not seen previously. Recommend attention on follow-up. 3. No other acute abnormalities. Electronically Signed   By: Dorise Bullion III M.D   On: 07/06/2017 13:35  Dg Hip Unilat With Pelvis 2-3 Views Right  Result Date: 07/03/2017 CLINICAL DATA:  Fall.  Right hip pain EXAM: DG HIP (WITH OR WITHOUT PELVIS) 2-3V RIGHT COMPARISON:  None. FINDINGS: Right femoral neck fracture with angulation and impaction. Fracture is in the subcapital region of the femoral neck. Calcification of the glenoid labrum. Joint space normal. No other pelvic fracture. Advanced disc degeneration L5-S1 IMPRESSION: Subcapital angulated fracture of the right femur. Electronically Signed   By: Franchot Gallo M.D.   On: 07/03/2017 12:03    ELIGIBLE FOR AVAILABLE RESEARCH PROTOCOL: no  ASSESSMENT: 83 y.o. 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 Her platelet counts appear to have been recovering prior to transfusion and are currently in the normal range.  I would repeat a platelet count this evening, and if stable would resume apixaban at that time  The patient has been instructed that she has a "heparin allergy" and that any exposure to heparin can be extremely dangerous to her. This needs to be emphasized to her and her family and caregivers.  In addition I do not find a bone density. This may have been done elsewhere but at any  rate if bone densitometry has not been performed in the last couple of years perhaps that could be done and osteoporotic prevention therapy started at her primary care physician's discretion.  I have no further suggestions. I will follow with you peripherally during the rest of the hospitalization Chauncey Cruel, MD   07/14/2017 11:08 AM Medical Oncology and Hematology [facility] [place], [place] [address] Tel. [phone]    Fax. [phone]

## 2017-07-14 NOTE — Progress Notes (Signed)
Patient ID: Theresa Kennedy, female   DOB: 02-27-1921, 83 y.o.   MRN: [id number]                                                                PROGRESS NOTE                                                                                                                                                                                                             Patient Demographics:    Theresa Kennedy, is a 83 y.o. female, DOB - [birth year], WUJ:[id number]  Admit date - 07/03/2017   Admitting Physician Catarina Hartshorn, MD  Outpatient Primary MD for the patient is Joette Catching, MD  LOS - 11  Outpatient Specialists:     Chief Complaint  Patient presents with  . Fall       Brief Narrative  83 y.o.femalewith a history of stroke, HTN, HLD, and atrial fibrillation who presented after a mechanical fall onto her right side while at an exercise dance class on the morning of 07/03/2017 due to her feet getting tangled up.   In the ED X-ray of the right hip revealed a subcapital angulated fracture of the right femur. EKG showed atrial fibrillation with nonspecific T-wave changes. Ortho was consulted to assist in management. The patient underwent a right hip bipolar replacement on 07/05/2017. Her postoperative course has been complicated by an ileus requiring NG tube placement as well as acute metabolic encephalopathy.    Subjective:    Shavontae Gibeault today has no bleeding.   Pt had low plt =11 yesterday.  Eliquis stopped.  Pt has HIT ab+.  Oncology Dr. Darnelle Catalan notified and oncology will consult, appreciate input.   No headache, No chest pain, No abdominal pain - No Nausea, No new weakness tingling or numbness, No Cough - SOB.    Assessment  & Plan :    Active Problems:   Hyponatremia   Ileus, postoperative (HCC)   Other fracture of right femur, initial encounter for closed fracture [facility])   Atrial fibrillation (HCC)   Essential hypertension   Abdominal distension   Adynamic ileus (HCC)   Hip  fracture (HCC)   AKI (acute kidney injury) (HCC)   Acute metabolic encephalopathy   Thrombocytopenia (HCC)   Subcapital right femur fracture  R-hip  bipolar replacement 8/17 - pt reportedly has a bed available for SNF placement on 8/25 - w/ slowly climbing WBC and drainage from wound will check CT of the thigh to r/o seroma or abscess formation - no purulent d/c from wound or local erythema appreciable No bleeding this AM CT scan => IMPRESSION: 1. Subcutaneous fluid collection deep to the skin staples is similar in appearance to the postoperative CT of 5 days prior and nonspecific. This is likely a postoperative hematoma/seroma, although superimposed infection cannot be excluded. 2. No new or enlarging fluid collections or residual soft tissue emphysema. 3. No osseous complication following right hip hemiarthroplasty. No evidence of osteomyelitis.  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 - pt has been off heparin since 8/22 - avoid heparin related products -   will STOP Eliquis for now due to oozing from incision and thrombocytopenia Consulted oncology , LDH, DIC panel performed No schistocytes Appreciate oncology input  Leukocytosis Secondary to UTI, started on Rocephin 1mg  iv qday D#2   IIeus  NG placed - reglan initiated - clinically appears resolved with NG removed 8/22 - tolerating her soft diet without difficulty  AKI renal US revealed no hydronephrosis - baseline creatinine 0.8-0.9 - creatinine stable and normal presently   Acute metabolic encephalopathy multifactorial including AKI,  ileus, opioids - CT brain neg for acute findings - UA unrevealing - B12 702 - ammonia 14 - appears to be back to her baseline mental status  Acute respiratory failure with hypoxia felt to be due to hypoventilation - resolved - wean to room air  Atrial fibrillation  Chadsvasc is 6 - rate controlled - plan was to resumeapixaban 8/19w/ Ortho blessing - switched to IV heparin as was unable to take po - heparin stopped 822 due to concern for bleeding at surgical site - now on DOAC => will STOP Eliquis for now due to oozing from incision and thrombocytopenia  Acute urinaryretention foley placed 8/20 - urecholine started 8/23 - d/c foley today and watch for urinary retention recurrence   HTN BP reasonably controlled  Hx of stroke 2014 pt without difficulty in performing ADLs per baseline - 07/03/2017 CT brain negative for acute findings noting old right frontal lobe infarct  Hyponatremia chronic - may be reflective of a degree of modest volume overload, though pt has no hx of CHF - one time dose of lasix today -  This AM Na=130, likely due to lasix check cmp in am  Goal of care Dr. Arbutus Leas discussed with nephew (HPOA) who confirmed patient is DNR  DVT prophylaxis: SCD Code Status:NO CODE - DNR Family Communication:no family present at time of exam  Disposition Plan:monitor for bleeding at surgical site - d/c foley - CT of hip to r/o abscess or seroma formation - UA and blood cx in setting of climbing WBC - possible d/c to SNF 8/25 if all issues stable/resolved (if not will need to be delayed)  Consultants:  Ortho Romeo Apple  Antimicrobials: Rocephin 1mg      Lab Results  Component Value Date   PLT 34 (L) 07/13/2017      Anti-infectives    Start  Dose/Rate Route Frequency Ordered Stop   07/13/17 2245  cefTRIAXone (ROCEPHIN) 1 g in dextrose 5 % 50 mL IVPB     1 g 100 mL/hr over 30 Minutes Intravenous Every 24 hours 07/13/17 2231     07/05/17 1400  ceFAZolin  (ANCEF) IVPB 2g/100 mL premix     2 g 200 mL/hr over 30 Minutes Intravenous Every 6 hours 07/05/17 1114 07/05/17 2222   07/05/17 0600  ceFAZolin (ANCEF) IVPB 2g/100 mL premix     2 g 200 mL/hr over 30 Minutes Intravenous On call to O.R. 07/04/17 1639 07/05/17 0759        Objective:   Vitals:   07/14/17 0045 07/14/17 0112 07/14/17 0245 07/14/17 0528  BP: (!) 132/55 (!) 119/50 (!) 140/57 (!) 135/38  Pulse: 89 80 68 80  Resp: 18 18 16 16   Temp: (!) 97.3 F (36.3 C) (!) 97.5 F (36.4 C) 97.6 F (36.4 C) (!) 97.5 F (36.4 C)  TempSrc: Oral Oral Oral Axillary  SpO2: 100% 100% 99% 100%  Weight:      Height:        Wt Readings from Last 3 Encounters:  07/03/17 48.4 kg (106 lb 12.8 oz)  09/11/16 50.2 kg (110 lb 9.6 oz)  07/25/16 49.9 kg (110 lb)     Intake/Output Summary (Last 24 hours) at 07/14/17 0818 Last data filed at 07/14/17 0656  Gross per 24 hour  Intake              886 ml  Output                0 ml  Net              886 ml     Physical Exam  Awake Alert, Oriented X 3, No new F.N deficits, Normal affect [place].AT,PERRAL Supple Neck,No JVD, No cervical lymphadenopathy appriciated.  Symmetrical Chest wall movement, Good air movement bilaterally, CTAB RRR,No Gallops,Rubs or new Murmurs, No Parasternal Heave +ve B.Sounds, Abd Soft, No tenderness, No organomegaly appriciated, No rebound - guarding or rigidity. No Cyanosis, Clubbing or edema, No new Rash or bruise    incisiion c/d/i, staples in place    Data Review:    CBC  Recent Labs Lab 07/10/17 1853 07/11/17 0513 07/12/17 0554 07/13/17 0653 07/13/17 2046 07/13/17 2316  WBC 9.4 13.1* 15.4* 20.3* 21.9*  --   HGB 8.9* 9.1* 9.6* 9.6* 11.5*  --   HCT 26.6* 26.8* 28.1* 28.6* 34.6*  --   PLT 48* 45* 38* 28* 11* 34*  MCV 97.1 96.4 95.9 97.6 99.7  --   MCH 32.5 32.7 32.8 32.8 33.1  --   MCHC 33.5 34.0 34.2 33.6 33.2  --   RDW 16.5* 17.4* 17.1* 17.4* 18.4*  --     Chemistries   Recent Labs Lab  07/08/17 0614 07/09/17 0602 07/10/17 0542 07/11/17 0513 07/12/17 0554 07/13/17 0653  NA 134* 138 142 134* 130* 133*  K 4.0 4.2 4.2 3.8 3.6 3.4*  CL 93* 101 108 101 97* 97*  CO2 28 32 29 28 27 30   GLUCOSE 183* 131* 103* 100* 104* 111*  BUN 57* 52* 33* 23* 21* 22*  CREATININE 1.33* 1.04* 0.75 0.72 0.65 0.75  CALCIUM 9.3 8.7* 8.2* 8.1* 8.0* 8.1*  MG  --   --  1.8  --   --   --   AST 68* 47*  --  34  --   --   ALT 26 22  --  17  --   --   ALKPHOS 32* 27*  --  30*  --   --   BILITOT 0.9 0.9  --  1.5*  --   --    ------------------------------------------------------------------------------------------------------------------ No results for input(s): CHOL, HDL, LDLCALC, TRIG, CHOLHDL, LDLDIRECT in the last 72 hours.  Lab Results  Component Value Date   HGBA1C 6.3 (H) 11/21/2015   ------------------------------------------------------------------------------------------------------------------ No results for input(s): TSH, T4TOTAL, T3FREE, THYROIDAB in the last 72 hours.  Invalid input(s): FREET3 ------------------------------------------------------------------------------------------------------------------ No results for input(s): VITAMINB12, FOLATE, FERRITIN, TIBC, IRON, RETICCTPCT in the last 72 hours.  Coagulation profile  Recent Labs Lab 07/13/17 2316  INR 1.10     Recent Labs  07/13/17 2316  DDIMER 6.83*    Cardiac Enzymes No results for input(s): CKMB, TROPONINI, MYOGLOBIN in the last 168 hours.  Invalid input(s): CK ------------------------------------------------------------------------------------------------------------------ No results found for: BNP  Inpatient Medications  Scheduled Meds: . bethanechol  10 mg Oral TID  . carbamide peroxide  5 drop Both EARS Weekly  . feeding supplement  1 Container Oral TID BM  . feeding supplement (PRO-STAT SUGAR FREE 64)  30 mL Oral BID  . mouth rinse  15 mL Mouth Rinse BID  . metoprolol tartrate  25 mg Oral  BID  . multivitamin with minerals  1 tablet Oral Daily   Continuous Infusions: . cefTRIAXone (ROCEPHIN)  IV Stopped (07/14/17 0226)   PRN Meds:.acetaminophen **OR** acetaminophen, fentaNYL (SUBLIMAZE) injection, HYDROcodone-acetaminophen, menthol-cetylpyridinium **OR** phenol, ondansetron (ZOFRAN) IV, polyethylene glycol  Micro Results Recent Results (from the past 240 hour(s))  Urine Culture     Status: None   Collection Time: 07/08/17 10:43 AM  Result Value Ref Range Status   Specimen Description URINE, CLEAN CATCH  Final   Special Requests NONE  Final   Culture   Final    NO GROWTH Performed at [facility], [address] [place], [place] [address]    Report Status 07/10/2017 FINAL  Final  Culture, blood (routine x 2)     Status: None (Preliminary result)   Collection Time: 07/12/17  4:32 PM  Result Value Ref Range Status   Specimen Description RIGHT ANTECUBITAL  Final   Special Requests Blood Culture adequate volume  Final   Culture NO GROWTH 2 DAYS  Final   Report Status PENDING  Incomplete  Culture, blood (routine x 2)     Status: None (Preliminary result)   Collection Time: 07/12/17  4:50 PM  Result Value Ref Range Status   Specimen Description BLOOD PICC LINE DRAW  Final   Special Requests Blood Culture adequate volume  Final   Culture NO GROWTH 2 DAYS  Final   Report Status PENDING  Incomplete    Radiology Reports Ct Abdomen Pelvis Wo Contrast  Result Date: 07/07/2017 CLINICAL DATA:  Inpatient. Abdominal distention and pain. Recent mechanical fall with right hip fracture and subsequent right hip arthroplasty. History of hysterectomy. EXAM: CT ABDOMEN AND PELVIS WITHOUT CONTRAST TECHNIQUE: Multidetector CT imaging of the abdomen and pelvis was performed following the standard protocol without IV contrast. COMPARISON:  02/12/2015 CT abdomen/ pelvis. Abdominal radiograph from earlier today. FINDINGS: Lower chest: Small dependent bilateral pleural effusions with  associated mild-to-moderate compressive atelectasis in the dependent lower lobes. Mild patchy tree-in-bud opacities and ground-glass opacities in the bilateral lower lobes. Coronary atherosclerosis. Oral contrast in the lower thoracic esophagus. Hepatobiliary: Two simple liver cysts, largest 4.2 cm centrally. No additional liver lesions. Normal liver size. Normal gallbladder with no radiopaque  cholelithiasis. No definite biliary ductal dilatation. Common bile duct diameter 7 mm, probably normal for age. Pancreas: Normal, with no mass or duct dilation. Spleen: Normal size. No mass. Adrenals/Urinary Tract: Normal adrenals. No renal stones. No hydronephrosis. No contour deforming renal masses. Limited visualization of the mildly distended bladder due to streak artifact from the right hip hardware, with no gross bladder abnormality. Stomach/Bowel: Small to moderate hiatal hernia. There is moderate gastric distension by oral contrast. Well-positioned enteric tube with tip in the gastric fundus. No gastric wall thickening. There is diffuse mild small bowel dilatation up to 3.5 cm diameter with scattered fluid levels in the small bowel. No small bowel wall thickening or focal small bowel caliber transition. Normal appendix. Mild diffuse distention of the colon with gas and stool. No large bowel wall thickening or pericolonic fat stranding. Vascular/Lymphatic: Atherosclerotic nonaneurysmal abdominal aorta. No pathologically enlarged lymph nodes in the abdomen or pelvis. Reproductive: Status post hysterectomy, with no abnormal findings at the vaginal cuff. No adnexal mass. Other: No pneumoperitoneum, ascites or focal fluid collection. Musculoskeletal: No aggressive appearing focal osseous lesions. Marked multilevel thoracolumbar degenerative disc disease. Partially visualized right hip hemiarthroplasty. Skin staples are seen lateral to the right hip. Expected postoperative soft tissue gas surrounding the right hip.  IMPRESSION: 1. Mild diffuse small and large bowel dilatation with scattered small bowel fluid levels, compatible with postoperative adynamic ileus. No free air. 2. Small to moderate hiatal hernia. Oral contrast in the lower thoracic esophagus, suggesting esophageal dysmotility and/or gastroesophageal reflux. 3. Mild patchy tree-in-bud and ground-glass opacities at the lung bases, suggesting mild aspiration. 4. Small dependent bilateral pleural effusions with mild dependent bibasilar atelectasis. 5.  Aortic Atherosclerosis (ICD10-I70.0).  Coronary atherosclerosis. Electronically Signed   By: Delbert Phenix M.D.   On: 07/07/2017 18:13   Dg Chest 1 View  Result Date: 07/08/2017 CLINICAL DATA:  83 year old female with shortness of breath. Pleural effusions. Recent hip surgery. EXAM: CHEST 1 VIEW COMPARISON:  CT Abdomen and Pelvis 07/07/17. Chest radiograph 07/03/2017. FINDINGS: AP semi upright view at 1423 hours. The patient is rotated to the left. NG tube in place, appears to be looped in the stomach similar to the configuration yesterday. Small bilateral pleural effusions were better demonstrated on the recent CT. Moderate size gastric hiatal hernia, stable mediastinal contours. Calcified aortic atherosclerosis. No pneumothorax, pulmonary edema or other confluent pulmonary opacity. IMPRESSION: Chest appears stable since the CT abdomen yesterday. Small pleural effusions. Stable NG tube. Electronically Signed   By: Odessa Fleming M.D.   On: 07/08/2017 14:49   Dg Chest 1 View  Result Date: 07/03/2017 CLINICAL DATA:  Hip fracture EXAM: CHEST 1 VIEW COMPARISON:  11/21/2015 FINDINGS: COPD with hyperinflation. Extensive apical scarring bilaterally unchanged. Negative for heart failure or pneumonia. Negative for pleural effusion. IMPRESSION: COPD with apical scarring. No acute abnormality and unchanged from the prior study. Electronically Signed   By: Marlan Palau M.D.   On: 07/03/2017 12:01   Dg Abd 1 View  Result Date:  07/07/2017 CLINICAL DATA:  NG tube placement. EXAM: ABDOMEN - 1 VIEW COMPARISON:  July 06, 2017 FINDINGS: An NG tube terminates in the left upper quadrant. The air-fluid level in the stomach is no longer seen. An air-filled structure left upper quadrant is likely mildly distended stomach. No obstruction or definitive ileus. IMPRESSION: 1. The NG tube is in good position. The air-fluid level in the stomach seen previously is no longer visualized. The stomach may remain mildly distended. 2. No obstruction or definitive  ileus. Electronically Signed   By: Gerome Sam III M.D   On: 07/07/2017 07:25   Ct Head Wo Contrast  Result Date: 07/08/2017 CLINICAL DATA:  83 year old female status post fall onto right side during exercise class. Altered mental status. EXAM: CT HEAD WITHOUT CONTRAST TECHNIQUE: Contiguous axial images were obtained from the base of the skull through the vertex without intravenous contrast. COMPARISON:  Head CT 07/03/2017, brain MRI 11/21/2015, and earlier. FINDINGS: Brain: Chronic infarct with encephalomalacia in the right superior frontal gyrus. Stable gray-white matter differentiation throughout the brain. No midline shift, ventriculomegaly, mass effect, evidence of mass lesion, intracranial hemorrhage or evidence of cortically based acute infarction. No other cortical encephalomalacia identified. Vascular: Calcified atherosclerosis at the skull base. Skull: Stable and intact. Sinuses/Orbits: Left side in G-tube in place. Visualized paranasal sinuses and mastoids are stable and well pneumatized. Other: No acute orbit or scalp soft tissue findings. IMPRESSION: No acute intracranial abnormality. No acute traumatic injury identified. Stable non contrast CT appearance of the brain, including chronic right MCA infarct. Electronically Signed   By: Odessa Fleming M.D.   On: 07/08/2017 14:46   Ct Head Wo Contrast  Result Date: 07/03/2017 CLINICAL DATA:  Fall at aerobics class. EXAM: CT HEAD WITHOUT  CONTRAST TECHNIQUE: Contiguous axial images were obtained from the base of the skull through the vertex without intravenous contrast. COMPARISON:  11/21/2015 FINDINGS: Brain: No evidence of acute infarction, hemorrhage, hydrocephalus, extra-axial collection or mass lesion/mass effect. Chronic right frontal lobe encephalomalacia identified There is mild diffuse low-attenuation within the subcortical and periventricular white matter compatible with chronic microvascular disease. Prominence of the sulci and ventricles noted. Vascular: No hyperdense vessel or unexpected calcification. Skull: Normal. Negative for fracture or focal lesion. Sinuses/Orbits: No acute finding. Other: None. IMPRESSION: 1. No acute intracranial abnormality. 2. Chronic small vessel ischemic disease and brain atrophy. 3. Chronic right frontal lobe infarct. Electronically Signed   By: Signa Kell M.D.   On: 07/03/2017 20:29   US Renal  Result Date: 07/08/2017 CLINICAL DATA:  Acute kidney injury. EXAM: RENAL / URINARY TRACT ULTRASOUND COMPLETE COMPARISON:  None. FINDINGS: Right Kidney: Length: 9.0 cm. Echogenicity within normal limits. No mass or hydronephrosis visualized. Left Kidney: Length: 7.8 cm. Echogenicity within normal limits. No mass or hydronephrosis visualized. Bladder: The bladder is empty with a Foley catheter in place. Incidental note is made of right pleural effusion and liver cysts as demonstrated on prior CT scan. IMPRESSION: Normal appearing kidneys bilaterally. Electronically Signed   By: Francene Boyers M.D.   On: 07/08/2017 15:12   Pelvis Portable  Result Date: 07/05/2017 CLINICAL DATA:  Postop hip fracture EXAM: PORTABLE PELVIS 1-2 VIEWS COMPARISON:  07/03/2017 FINDINGS: Post right hip replacement. Normal AP alignment. No hardware or bony complicating feature. Mild degenerative changes in the left hip joint. IMPRESSION: Right hip replacement.  No complicating feature. Electronically Signed   By: Charlett Nose M.D.    On: 07/05/2017 10:42   Ct Femur Right Wo Contrast  Result Date: 07/13/2017 CLINICAL DATA:  Postop right hip arthroplasty for fracture 07/05/2017. Increasing wound drainage and leukocytosis. EXAM: CT OF THE LOWER RIGHT EXTREMITY WITHOUT CONTRAST TECHNIQUE: Multidetector CT imaging of the right thigh was performed according to the standard protocol. COMPARISON:  Radiographs 07/03/2017 and 07/05/2017. Pelvic CT 07/07/2017. FINDINGS: Bones/Joint/Cartilage Patient is status post right hip hemiarthroplasty. The prosthesis is well positioned. There is no evidence of acute fracture, dislocation or bone destruction. The prosthesis creates moderate beam hardening artifact, limiting evaluation of the  immediate surrounding soft tissues. No large hip joint effusion identified. There are mild tricompartmental degenerative changes at the knee with meniscal chondrocalcinosis and a tiny Baker's cyst. There is no significant knee joint effusion. Ligaments Not relevant for examination. Muscles and Tendons Postsurgical changes within the right gluteus musculature. There is underlying muscular atrophy and enthesophyte formation, similar to recent CT. There is no focal intramuscular fluid collection or residual soft tissue emphysema. Enthesophytes are present within the proximal hamstring tendons. Soft tissues Deep to the skin staples laterally, there is a lenticular shaped subcutaneous fluid collection measuring up to 2.5 cm in thickness. The superior extent of this collection is not imaged on this examination. Inferiorly, this extends to the level of the ischial tuberosity (greater than 14 cm cephalocaudad extent). This fluid is superficial to the iliotibial band, ill-defined and similar in size to the postoperative CT of 5 days ago. No other focal fluid collections are identified. There is mild subcutaneous edema throughout the proximal thigh. Mild femoral arterial atherosclerosis noted. IMPRESSION: 1. Subcutaneous fluid  collection deep to the skin staples is similar in appearance to the postoperative CT of 5 days prior and nonspecific. This is likely a postoperative hematoma/seroma, although superimposed infection cannot be excluded. 2. No new or enlarging fluid collections or residual soft tissue emphysema. 3. No osseous complication following right hip hemiarthroplasty. No evidence of osteomyelitis. Electronically Signed   By: Carey Bullocks M.D.   On: 07/13/2017 08:20   Dg Chest Port 1 View  Result Date: 07/09/2017 CLINICAL DATA:  Check nasogastric catheter placement EXAM: PORTABLE CHEST 1 VIEW COMPARISON:  07/08/2017 FINDINGS: Nasogastric catheter remains coiled within the stomach. Cardiac shadow is stable. Stable hiatal hernia. Left-sided PICC line is noted in satisfactory position. Small left pleural effusion is again noted. IMPRESSION: Nasogastric catheter remains in the stomach. Remainder the exam is stable. Electronically Signed   By: Alcide Clever M.D.   On: 07/09/2017 10:44   Dg Abd 2 Views  Result Date: 07/06/2017 CLINICAL DATA:  Abdominal distention. EXAM: ABDOMEN - 2 VIEW COMPARISON:  February 14, 2015 FINDINGS: There is an air-fluid level in the left upper quadrant. Statistically, this is most likely to be within a distended stomach. No air under the right hemidiaphragm. Probable hiatal hernia. Lung bases otherwise normal. Rounded density over the left mid abdomen. No bowel obstruction. No other acute abnormalities. Scoliotic and degenerative changes of the spine. IMPRESSION: 1. Air-fluid level over the left upper quadrant is statistically most likely to represent a stomach distended with gas and fluid. No free air as right hemidiaphragm. 2. There is a rounded density over the left mid abdomen on one view which is nonspecific. This could be artifactual or something on the patient. This was not seen previously. Recommend attention on follow-up. 3. No other acute abnormalities. Electronically Signed   By: Gerome Sam III M.D   On: 07/06/2017 13:35   Dg Hip Unilat With Pelvis 2-3 Views Right  Result Date: 07/03/2017 CLINICAL DATA:  Fall.  Right hip pain EXAM: DG HIP (WITH OR WITHOUT PELVIS) 2-3V RIGHT COMPARISON:  None. FINDINGS: Right femoral neck fracture with angulation and impaction. Fracture is in the subcapital region of the femoral neck. Calcification of the glenoid labrum. Joint space normal. No other pelvic fracture. Advanced disc degeneration L5-S1 IMPRESSION: Subcapital angulated fracture of the right femur. Electronically Signed   By: Marlan Palau M.D.   On: 07/03/2017 12:03    Time Spent in minutes  30   Pearson Grippe  M.D on 07/14/2017 at 8:18 AM  Between 7am to 7pm - Pager - [phone]  After 7pm go to [email or web address] - password [email or web address]  [facility] -  Office  [phone]

## 2017-07-14 NOTE — Progress Notes (Signed)
Patient transferred to [facility] via [facility]. Patient in stable condition, report given to [facility].

## 2017-07-14 NOTE — Progress Notes (Signed)
Patient has arrived to the unit. Pt A&Ox3. Dressing to right hip area CDI. Skin over all is thin, several bruises to LUE and Right lateral side flank area.  will continue to monitor. See assessment.

## 2017-07-15 LAB — COMPREHENSIVE METABOLIC PANEL
ALT: 16 U/L (ref 14–54)
AST: 24 U/L (ref 15–41)
Albumin: 2.9 g/dL — ABNORMAL LOW (ref 3.5–5.0)
Alkaline Phosphatase: 48 U/L (ref 38–126)
Anion gap: 5 (ref 5–15)
BILIRUBIN TOTAL: 0.8 mg/dL (ref 0.3–1.2)
BUN: 30 mg/dL — ABNORMAL HIGH (ref 6–20)
CALCIUM: 8.2 mg/dL — AB (ref 8.9–10.3)
CHLORIDE: 97 mmol/L — AB (ref 101–111)
CO2: 31 mmol/L (ref 22–32)
CREATININE: 0.65 mg/dL (ref 0.44–1.00)
Glucose, Bld: 106 mg/dL — ABNORMAL HIGH (ref 65–99)
Potassium: 4.1 mmol/L (ref 3.5–5.1)
Sodium: 133 mmol/L — ABNORMAL LOW (ref 135–145)
TOTAL PROTEIN: 4.9 g/dL — AB (ref 6.5–8.1)

## 2017-07-15 LAB — CBC
HEMATOCRIT: 27.1 % — AB (ref 36.0–46.0)
Hemoglobin: 9 g/dL — ABNORMAL LOW (ref 12.0–15.0)
MCH: 32.6 pg (ref 26.0–34.0)
MCHC: 33.2 g/dL (ref 30.0–36.0)
MCV: 98.2 fL (ref 78.0–100.0)
PLATELETS: 129 10*3/uL — AB (ref 150–400)
RBC: 2.76 MIL/uL — AB (ref 3.87–5.11)
RDW: 18.5 % — ABNORMAL HIGH (ref 11.5–15.5)
WBC: 14.5 10*3/uL — ABNORMAL HIGH (ref 4.0–10.5)

## 2017-07-15 LAB — URINE CULTURE

## 2017-07-15 LAB — BPAM PLATELET PHERESIS
BLOOD PRODUCT EXPIRATION DATE: 201808272359
ISSUE DATE / TIME: 201808260039
UNIT TYPE AND RH: 5100

## 2017-07-15 LAB — PREPARE PLATELET PHERESIS: Unit division: 0

## 2017-07-15 MED ORDER — CEFAZOLIN SODIUM-DEXTROSE 1-4 GM/50ML-% IV SOLN
1.0000 g | Freq: Three times a day (TID) | INTRAVENOUS | Status: DC
  Administered 2017-07-15 – 2017-07-16 (×3): 1 g via INTRAVENOUS
  Filled 2017-07-15 (×5): qty 50

## 2017-07-15 MED ORDER — APIXABAN 2.5 MG PO TABS
2.5000 mg | ORAL_TABLET | Freq: Two times a day (BID) | ORAL | Status: DC
  Administered 2017-07-15 – 2017-07-16 (×3): 2.5 mg via ORAL
  Filled 2017-07-15 (×3): qty 1

## 2017-07-15 NOTE — Progress Notes (Signed)
Discharge plan is for DC to [facility]. CM will continue to follow and assist as needed. Sandford Craze RN,BSN,NCM [phone]

## 2017-07-15 NOTE — Progress Notes (Signed)
Physical Therapy Treatment Patient Details Name: Theresa Kennedy MRN: [id number] DOB: [birth year] Today's Date: 07/15/2017    History of Present Illness Theresa Kennedy is a 83 y.o. female with medical history of stroke, essential hypertension, hyperlipidemia, and atrial fibrillation presented with a mechanical fall onto her right side while at an exercise dance class on the morning of 07/03/2017.  Pt sustained R femur fracture and underwent a right hip bipolar replacement on 07/05/2017    PT Comments    Pt assisted with transfer to recliner and currently requiring min assist.  Pt did not feel able to tolerate ambulation today.  Follow Up Recommendations  SNF     Equipment Recommendations  None recommended by PT    Recommendations for Other Services       Precautions / Restrictions Precautions Precautions: Fall;Posterior Hip Precaution Comments: pt unable to recall posterior hip precautions Restrictions Weight Bearing Restrictions: Yes RLE Weight Bearing: Weight bearing as tolerated    Mobility  Bed Mobility Overal bed mobility: Needs Assistance Bed Mobility: Supine to Sit     Supine to sit: Min assist     General bed mobility comments: increased time and effort, slight assist to scoot out to EOB, required bed rail  Transfers Overall transfer level: Needs assistance Equipment used: Rolling walker (2 wheeled) Transfers: Sit to/from [employer] Sit to Stand: Min assist Stand pivot transfers: Min assist       General transfer comment: verbal cues for maintaining precautions, assist to rise, steady and control descent  Ambulation/Gait                 Stairs            Wheelchair Mobility    Modified Rankin (Stroke Patients Only)       Balance                                            Cognition Arousal/Alertness: Awake/alert Behavior During Therapy: WFL for tasks assessed/performed Overall Cognitive Status:  Within Functional Limits for tasks assessed                                        Exercises      General Comments        Pertinent Vitals/Pain Pain Assessment: 0-10 Pain Score: 5  Pain Location: back Pain Descriptors / Indicators: Aching Pain Intervention(s): Limited activity within patient's tolerance;Repositioned;Monitored during session    Home Living                      Prior Function            PT Goals (current goals can now be found in the care plan section) Acute Rehab PT Goals PT Goal Formulation: With patient Time For Goal Achievement: 07/19/17 Potential to Achieve Goals: Good Progress towards PT goals: Progressing toward goals    Frequency    Min 3X/week      PT Plan Current plan remains appropriate    Co-evaluation              AM-PAC PT "6 Clicks" Daily Activity  Outcome Measure  Difficulty turning over in bed (including adjusting bedclothes, sheets and blankets)?: Unable Difficulty moving from lying on back to sitting on the side  of the bed? : Unable Difficulty sitting down on and standing up from a chair with arms (e.g., wheelchair, bedside commode, etc,.)?: Unable Help needed moving to and from a bed to chair (including a wheelchair)?: A Lot Help needed walking in hospital room?: Total Help needed climbing 3-5 steps with a railing? : Total 6 Click Score: 7    End of Session Equipment Utilized During Treatment: Gait belt Activity Tolerance: Patient limited by fatigue Patient left: in chair;with call bell/phone within reach;with chair alarm set Nurse Communication: Mobility status PT Visit Diagnosis: Difficulty in walking, not elsewhere classified (R26.2)     Time: 8251-8984 PT Time Calculation (min) (ACUTE ONLY): 15 min  Charges:  $Therapeutic Activity: 8-22 mins                    G Codes:       Zenovia Jarred, PT, DPT 07/15/2017 Pager: [phone]  Maida Sale E 07/15/2017, 1:13 PM

## 2017-07-15 NOTE — Progress Notes (Signed)
ANTICOAGULATION CONSULT NOTE - Initial Consult  Pharmacy Consult for apixaban Indication: Nonvalvular Atrial Fibrillation  Allergies  Allergen Reactions  . Heparin     HIT positive  . Sulfa Antibiotics Other (See Comments)    unknown    Patient Measurements: Height: 5\' 3"  (160 cm) Weight: 113 lb 15.7 oz (51.7 kg) IBW/kg (Calculated) : 52.4   Vital Signs: Temp: 97.5 F (36.4 C) (08/26 2143) Temp Source: Oral (08/26 2143) BP: 115/35 (08/26 2143) Pulse Rate: 67 (08/26 2143)  Labs:  Recent Labs  07/13/17 0653 07/13/17 2046 07/13/17 2316 07/14/17 1030 07/14/17 1800 07/15/17 0408  HGB 9.6* 11.5*  --   --  8.2* 9.0*  HCT 28.6* 34.6*  --   --  24.4* 27.1*  PLT 28* 11* 34* 149* 120* 129*  APTT  --   --  28  --   --   --   LABPROT  --   --  14.3  --   --   --   INR  --   --  1.10  --   --   --   CREATININE 0.75  --   --   --   --  0.65    Estimated Creatinine Clearance: 35.9 mL/min (by C-G formula based on SCr of 0.65 mg/dL).   Medical History: Past Medical History:  Diagnosis Date  . Atrial fibrillation (HCC)   . GERD (gastroesophageal reflux disease)   . Hypertension   . Stroke [facility])      Assessment: 83 y.o.femalewith a history of stroke, HTN, HLD, and atrial fibrillation who presented after a mechanical fall onto her right side. Pt underwent right hip replacement on 8/17. 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  Pharmacy consulted to resume eliquis.  07/15/2017 Scr 0.65, CrCl ~ 52mls/min Plts 129 Hgb 9.0  Goal of Therapy:  Monitor platelets by anticoagulation protocol   Plan:  Resume home dose of apixaban 2.5mg  po twice daily Monitor for signs and symptoms of bleeding  Arley Phenix RPh 07/15/2017, 8:45 AM Pager [phone]

## 2017-07-15 NOTE — Progress Notes (Signed)
Theresa Kennedy   DOB:1921-03-01   ZO#:[id number]   JWJ#:[id number]  Subjective:  Theresa Kennedy had an uneventful night. She has noted no bleeding. Pain is well-controlled. No family in room   Objective: Elderly white woman examined in bed Vitals:   07/14/17 1119 07/14/17 2143  BP: (!) 130/44 (!) 115/35  Pulse: 64 67  Resp:  16  Temp:  (!) 97.5 F (36.4 C)  SpO2:  97%    Body mass index is 20.19 kg/m.  Intake/Output Summary (Last 24 hours) at 07/15/17 0813 Last data filed at 07/15/17 0646  Gross per 24 hour  Intake              290 ml  Output              800 ml  Net             -510 ml       CBG (last 3)  No results for input(s): GLUCAP in the last 72 hours.   Labs:  Lab Results  Component Value Date   WBC 14.5 (H) 07/15/2017   HGB 9.0 (L) 07/15/2017   HCT 27.1 (L) 07/15/2017   MCV 98.2 07/15/2017   PLT 129 (L) 07/15/2017   NEUTROABS 5.9 07/03/2017    @LASTCHEMISTRY @  Urine Studies No results for input(s): UHGB, CRYS in the last 72 hours.  Invalid input(s): UACOL, UAPR, USPG, UPH, UTP, UGL, UKET, UBIL, UNIT, UROB, ULEU, UEPI, UWBC, URBC, [place], [place], [place], [place]  Basic Metabolic Panel:  Recent Labs Lab 07/10/17 0542 07/11/17 0513 07/12/17 0554 07/13/17 0653 07/15/17 0408  NA 142 134* 130* 133* 133*  K 4.2 3.8 3.6 3.4* 4.1  CL 108 101 97* 97* 97*  CO2 29 28 27 30 31   GLUCOSE 103* 100* 104* 111* 106*  BUN 33* 23* 21* 22* 30*  CREATININE 0.75 0.72 0.65 0.75 0.65  CALCIUM 8.2* 8.1* 8.0* 8.1* 8.2*  MG 1.8  --   --   --   --    GFR Estimated Creatinine Clearance: 35.9 mL/min (by C-G formula based on SCr of 0.65 mg/dL). Liver Function Tests:  Recent Labs Lab 07/09/17 0602 07/11/17 0513 07/15/17 0408  AST 47* 34 24  ALT 22 17 16   ALKPHOS 27* 30* 48  BILITOT 0.9 1.5* 0.8  PROT 4.7* 4.5* 4.9*  ALBUMIN 3.0* 2.9* 2.9*   No results for input(s): LIPASE, AMYLASE in the last 168 hours.  Recent Labs Lab 07/08/17 1359 07/09/17 0602  AMMONIA 39* 14    Coagulation profile  Recent Labs Lab 07/13/17 2316  INR 1.10    CBC:  Recent Labs Lab 07/12/17 0554 07/13/17 0653 07/13/17 2046 07/13/17 2316 07/14/17 1030 07/14/17 1800 07/15/17 0408  WBC 15.4* 20.3* 21.9*  --   --  16.4* 14.5*  HGB 9.6* 9.6* 11.5*  --   --  8.2* 9.0*  HCT 28.1* 28.6* 34.6*  --   --  24.4* 27.1*  MCV 95.9 97.6 99.7  --   --  98.8 98.2  PLT 38* 28* 11* 34* 149* 120* 129*   Cardiac Enzymes: No results for input(s): CKTOTAL, CKMB, CKMBINDEX, TROPONINI in the last 168 hours. BNP: Invalid input(s): POCBNP CBG: No results for input(s): GLUCAP in the last 168 hours. D-Dimer  Recent Labs  07/13/17 2316  DDIMER 6.83*   Hgb A1c No results for input(s): HGBA1C in the last 72 hours. Lipid Profile No results for input(s): CHOL, HDL, LDLCALC, TRIG, CHOLHDL, LDLDIRECT in the last 72 hours.  Thyroid function studies No results for input(s): TSH, T4TOTAL, T3FREE, THYROIDAB in the last 72 hours.  Invalid input(s): FREET3 Anemia work up No results for input(s): VITAMINB12, FOLATE, FERRITIN, TIBC, IRON, RETICCTPCT in the last 72 hours. Microbiology Recent Results (from the past 240 hour(s))  Urine Culture     Status: None   Collection Time: 07/08/17 10:43 AM  Result Value Ref Range Status   Specimen Description URINE, CLEAN CATCH  Final   Special Requests NONE  Final   Culture   Final    NO GROWTH Performed at [facility], [address], [place], [place] [address]    Report Status 07/10/2017 FINAL  Final  Culture, blood (routine x 2)     Status: None (Preliminary result)   Collection Time: 07/12/17  4:32 PM  Result Value Ref Range Status   Specimen Description RIGHT ANTECUBITAL  Final   Special Requests Blood Culture adequate volume  Final   Culture NO GROWTH 3 DAYS  Final   Report Status PENDING  Incomplete  Culture, blood (routine x 2)     Status: None (Preliminary result)   Collection Time: 07/12/17  4:50 PM  Result Value Ref Range  Status   Specimen Description BLOOD PICC LINE DRAW  Final   Special Requests Blood Culture adequate volume  Final   Culture NO GROWTH 3 DAYS  Final   Report Status PENDING  Incomplete  Urine Culture     Status: Abnormal (Preliminary result)   Collection Time: 07/13/17  5:30 AM  Result Value Ref Range Status   Specimen Description URINE, CLEAN CATCH  Final   Special Requests NONE  Final   Culture >=100,000 COLONIES/mL ESCHERICHIA COLI (A)  Final   Report Status PENDING  Incomplete      Studies:  No results found.  Assessment: 83 y.o. 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 May resume her chronic apixaban at your discretion.  No further hematologic follow-up as needed. We will sign off at this point  Lowella Dell, MD 07/15/2017  8:13 AM Medical Oncology and Hematology [facility] [address] [place], [place] [address] Tel. [phone]    Fax. [phone]

## 2017-07-15 NOTE — Progress Notes (Signed)
CSW provided update to [facility] regarding patient's medical stability. Staff from SNF reported that patient's insurance authorization may expire prior to tomorrow and they would check with patient's insurance company about authorization status. Staff reported that patient may need to restart insurance authorization process.  CSW contacted by SNF staff who reported that patient's insurance authorization may remain current and not have to be restarted if patient discharges tomorrow.   CSW updated patient about insurance authorization status. Patient confirmed that she still wanted to go to [facility] at discharge and that she thinks she will be discharged tomorrow.   CSW checked with patient's attending about anticipated discharge date, patient's attending reported that patient should be able to discharge tomorrow.   CSW updated SNF, who agreed to update patient's insurance company. CSW will continue to follow and assist patient with discharge planning.  Celso Sickle, [place] Clinical Social Worker [facility] Cell#: [phone]

## 2017-07-15 NOTE — Progress Notes (Signed)
Patient ID: Theresa Kennedy, female   DOB: [birth year], 83 y.o.   MRN: [id number]                                                                PROGRESS NOTE                                                                                                                                                                                                             Patient Demographics:    Theresa Kennedy, is a 83 y.o. female, DOB - [birth year], NWG:[id number]  Admit date - 07/03/2017   Admitting Physician Catarina Hartshorn, MD  Outpatient Primary MD for the patient is Joette Catching, MD  LOS - 12  Outpatient Specialists: [facility]  Chief Complaint  Patient presents with  . Fall       Brief Narrative  83 y.o.femalewith a history of stroke, HTN, HLD, and atrial fibrillation who presented after a mechanical fall onto her right side while at an exercise dance class on the morning of 07/03/2017 due to her feet getting tangled up.   In the ED X-ray of the right hip revealed a subcapital angulated fracture of the right femur. EKG showed atrial fibrillation with nonspecific T-wave changes. Ortho was consulted to assist in management. The patient underwent a right hip bipolar replacement on 07/05/2017. Her postoperative course has been complicated by an ileus requiring NG tube placement as well as acute metabolic encephalopathy.    Subjective:    [name] today has been doing well.  Afebrile. wbc decreasing, denies dysuria, hematuria.  No bleeding, plt improved.   No headache, No chest pain, No abdominal pain - No Nausea, No new weakness tingling or numbness, No Cough - SOB.    Assessment  & Plan :    Active Problems:   Hyponatremia   Ileus, postoperative (HCC)   Other fracture of right femur, initial encounter for closed fracture [facility])   Atrial fibrillation (HCC)   Essential hypertension   Abdominal distension   Adynamic ileus (HCC)   Hip fracture (HCC)   AKI (acute kidney injury)  (HCC)   Acute metabolic encephalopathy   Thrombocytopenia (HCC)    Subcapital right femur fracture  R-hip bipolar replacement 8/17 - pt reportedly has a bed available for SNF placement on  8/25 - w/ slowly climbing WBC and drainage from wound will check CT of the thigh to r/o seroma or abscess formation - no purulent d/c from wound or local erythema appreciable No bleeding this AM CT scan => IMPRESSION: 1. Subcutaneous fluid collection deep to the skin staples is similar in appearance to the postoperative CT of 5 days prior and nonspecific. This is likely a postoperative hematoma/seroma, although superimposed infection cannot be excluded. 2. No new or enlarging fluid collections or residual soft tissue emphysema. 3. No osseous complication following right hip hemiarthroplasty. No evidence of osteomyelitis.  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 - pt has been off heparin since 8/22 - avoid heparin related products -  will STOP Eliquis for now due to oozing from incision and thrombocytopenia Consulted oncology , LDH, DIC panel performed No schistocytes Appreciate oncology input  Leukocytosis Secondary to UTI, started on Rocephin 1mg  iv qday D#3   IIeus  NG placed - reglan initiated - clinically appears resolved with NG removed 8/22 - tolerating her soft diet without difficulty  AKI renal US revealed no hydronephrosis - baseline creatinine 0.8-0.9 - creatinine stable and normal presently   Acute metabolic encephalopathy multifactorial including AKI, ileus, opioids - CT brain neg for acute  findings - UA unrevealing - B12 702 - ammonia 14 - appears to be back to her baseline mental status  Acute respiratory failure with hypoxia felt to be due to hypoventilation - resolved - wean to room air  Atrial fibrillation  Chadsvasc is 6 - rate controlled - plan was to resumeapixaban 8/19w/ Ortho blessing - switched to IV heparin as was unable to take po - heparin stopped 822 due to concern for bleeding at surgical site - now on DOAC => will STOP Eliquis for now due to oozing from incision and thrombocytopenia Restart Eliquis on 8/27 pharmacy to dose   Acute urinaryretention foley placed 8/20 - urecholine started 8/23 - d/c foley today and watch for urinary retention recurrence   HTN BP reasonably controlled  Hx of stroke 2014 pt without difficulty in performing ADLs per baseline - 07/03/2017 CT brain negative for acute findings noting old right frontal lobe infarct  Hyponatremia chronic - may be reflective of a degree of modest volume overload, though pt has no hx of CHF - one time dose of lasix today -  This AM Na=130, likely due to lasix check cmp in am  Goal of care Dr. Arbutus Leas discussed with nephew (HPOA) who confirmed patient is DNR  DVT prophylaxis:SCD Code Status:NO CODE - DNR Family Communication:no family present at time of exam  Disposition Plan:monitor for bleeding at surgical site - d/c foley - CT of hip to r/o abscess or seroma formation - UA and blood cx in setting of climbing WBC - possible d/c to SNF 8/28 if all issues stable/resolved   Consultants:  Ortho Romeo Apple  Antimicrobials: Rocephin 1mg    Lab Results  Component Value Date   PLT 129 (L) 07/15/2017    Anti-infectives    Start     Dose/Rate Route Frequency Ordered Stop   07/13/17 2245  cefTRIAXone (ROCEPHIN) 1 g in  dextrose 5 % 50 mL IVPB     1 g 100 mL/hr over 30 Minutes Intravenous Every 24 hours 07/13/17 2231     07/05/17 1400  ceFAZolin (ANCEF) IVPB 2g/100 mL premix     2  g 200 mL/hr over 30 Minutes Intravenous Every 6 hours 07/05/17 1114 07/05/17 2222   07/05/17 0600  ceFAZolin (ANCEF) IVPB 2g/100 mL premix     2 g 200 mL/hr over 30 Minutes Intravenous On call to O.R. 07/04/17 1639 07/05/17 0759        Objective:   Vitals:   07/14/17 0528 07/14/17 0922 07/14/17 1119 07/14/17 2143  BP: (!) 135/38 (!) 121/41 (!) 130/44 (!) 115/35  Pulse: 80 97 64 67  Resp: 16 18  16   Temp: (!) 97.5 F (36.4 C) 97.6 F (36.4 C)  (!) 97.5 F (36.4 C)  TempSrc: Axillary Oral  Oral  SpO2: 100% 97%  97%  Weight:  51.7 kg (113 lb 15.7 oz)    Height:  5\' 3"  (1.6 m)      Wt Readings from Last 3 Encounters:  07/14/17 51.7 kg (113 lb 15.7 oz)  09/11/16 50.2 kg (110 lb 9.6 oz)  07/25/16 49.9 kg (110 lb)     Intake/Output Summary (Last 24 hours) at 07/15/17 0827 Last data filed at 07/15/17 0646  Gross per 24 hour  Intake              290 ml  Output              800 ml  Net             -510 ml     Physical Exam  Awake Alert, Oriented X 3, No new F.N deficits, Normal affect Benedict.AT,PERRAL Supple Neck,No JVD, No cervical lymphadenopathy appriciated.  Symmetrical Chest wall movement, Good air movement bilaterally, CTAB RRR,No Gallops,Rubs or new Murmurs, No Parasternal Heave +ve B.Sounds, Abd Soft, No tenderness, No organomegaly appriciated, No rebound - guarding or rigidity. No Cyanosis, Clubbing or edema, No new Rash or bruise   Incision c/d/i    Data Review:    CBC  Recent Labs Lab 07/12/17 0554 07/13/17 0653 07/13/17 2046 07/13/17 2316 07/14/17 1030 07/14/17 1800 07/15/17 0408  WBC 15.4* 20.3* 21.9*  --   --  16.4* 14.5*  HGB 9.6* 9.6* 11.5*  --   --  8.2* 9.0*  HCT 28.1* 28.6* 34.6*  --   --  24.4* 27.1*  PLT 38* 28* 11* 34* 149* 120* 129*  MCV 95.9 97.6 99.7  --   --  98.8 98.2  MCH 32.8 32.8 33.1  --   --  33.2 32.6  MCHC 34.2 33.6 33.2  --   --  33.6 33.2  RDW 17.1* 17.4* 18.4*  --   --  18.3* 18.5*    Chemistries   Recent Labs Lab  07/09/17 0602 07/10/17 0542 07/11/17 0513 07/12/17 0554 07/13/17 0653 07/15/17 0408  NA 138 142 134* 130* 133* 133*  K 4.2 4.2 3.8 3.6 3.4* 4.1  CL 101 108 101 97* 97* 97*  CO2 32 29 28 27 30 31   GLUCOSE 131* 103* 100* 104* 111* 106*  BUN 52* 33* 23* 21* 22* 30*  CREATININE 1.04* 0.75 0.72 0.65 0.75 0.65  CALCIUM 8.7* 8.2* 8.1* 8.0* 8.1* 8.2*  MG  --  1.8  --   --   --   --   AST 47*  --  34  --   --  24  ALT 22  --  17  --   --  16  ALKPHOS 27*  --  30*  --   --  48  BILITOT 0.9  --  1.5*  --   --  0.8   ------------------------------------------------------------------------------------------------------------------ No results for input(s): CHOL, HDL, LDLCALC, TRIG, CHOLHDL, LDLDIRECT in the last 72 hours.  Lab Results  Component Value Date   HGBA1C 6.3 (H) 11/21/2015   ------------------------------------------------------------------------------------------------------------------ No results for input(s): TSH, T4TOTAL, T3FREE, THYROIDAB in the last 72 hours.  Invalid input(s): FREET3 ------------------------------------------------------------------------------------------------------------------ No results for input(s): VITAMINB12, FOLATE, FERRITIN, TIBC, IRON, RETICCTPCT in the last 72 hours.  Coagulation profile  Recent Labs Lab 07/13/17 2316  INR 1.10     Recent Labs  07/13/17 2316  DDIMER 6.83*    Cardiac Enzymes No results for input(s): CKMB, TROPONINI, MYOGLOBIN in the last 168 hours.  Invalid input(s): CK ------------------------------------------------------------------------------------------------------------------ No results found for: BNP  Inpatient Medications  Scheduled Meds: . bethanechol  10 mg Oral TID  . carbamide peroxide  5 drop Both EARS Weekly  . feeding supplement  1 Container Oral TID BM  . feeding supplement (PRO-STAT SUGAR FREE 64)  30 mL Oral BID  . mouth rinse  15 mL Mouth Rinse BID  . metoprolol tartrate  25 mg Oral  BID  . multivitamin with minerals  1 tablet Oral Daily  . sodium chloride flush  10-40 mL Intracatheter Q12H   Continuous Infusions: . cefTRIAXone (ROCEPHIN)  IV 1 g (07/14/17 2319)   PRN Meds:.acetaminophen **OR** acetaminophen, fentaNYL (SUBLIMAZE) injection, HYDROcodone-acetaminophen, menthol-cetylpyridinium **OR** phenol, ondansetron (ZOFRAN) IV, polyethylene glycol, sodium chloride flush  Micro Results Recent Results (from the past 240 hour(s))  Urine Culture     Status: None   Collection Time: 07/08/17 10:43 AM  Result Value Ref Range Status   Specimen Description URINE, CLEAN CATCH  Final   Special Requests NONE  Final   Culture   Final    NO GROWTH Performed at [facility], [address], [place], [place] [address]    Report Status 07/10/2017 FINAL  Final  Culture, blood (routine x 2)     Status: None (Preliminary result)   Collection Time: 07/12/17  4:32 PM  Result Value Ref Range Status   Specimen Description RIGHT ANTECUBITAL  Final   Special Requests Blood Culture adequate volume  Final   Culture NO GROWTH 3 DAYS  Final   Report Status PENDING  Incomplete  Culture, blood (routine x 2)     Status: None (Preliminary result)   Collection Time: 07/12/17  4:50 PM  Result Value Ref Range Status   Specimen Description BLOOD PICC LINE DRAW  Final   Special Requests Blood Culture adequate volume  Final   Culture NO GROWTH 3 DAYS  Final   Report Status PENDING  Incomplete  Urine Culture     Status: Abnormal (Preliminary result)   Collection Time: 07/13/17  5:30 AM  Result Value Ref Range Status   Specimen Description URINE, CLEAN CATCH  Final   Special Requests NONE  Final   Culture >=100,000 COLONIES/mL ESCHERICHIA COLI (A)  Final   Report Status PENDING  Incomplete    Radiology Reports Ct Abdomen Pelvis Wo Contrast  Result Date: 07/07/2017 CLINICAL DATA:  Inpatient. Abdominal distention and pain. Recent mechanical fall with right hip fracture and subsequent  right hip arthroplasty. History of hysterectomy. EXAM: CT ABDOMEN AND PELVIS WITHOUT CONTRAST TECHNIQUE: Multidetector CT imaging of the abdomen and pelvis  was performed following the standard protocol without IV contrast. COMPARISON:  02/12/2015 CT abdomen/ pelvis. Abdominal radiograph from earlier today. FINDINGS: Lower chest: Small dependent bilateral pleural effusions with associated mild-to-moderate compressive atelectasis in the dependent lower lobes. Mild patchy tree-in-bud opacities and ground-glass opacities in the bilateral lower lobes. Coronary atherosclerosis. Oral contrast in the lower thoracic esophagus. Hepatobiliary: Two simple liver cysts, largest 4.2 cm centrally. No additional liver lesions. Normal liver size. Normal gallbladder with no radiopaque cholelithiasis. No definite biliary ductal dilatation. Common bile duct diameter 7 mm, probably normal for age. Pancreas: Normal, with no mass or duct dilation. Spleen: Normal size. No mass. Adrenals/Urinary Tract: Normal adrenals. No renal stones. No hydronephrosis. No contour deforming renal masses. Limited visualization of the mildly distended bladder due to streak artifact from the right hip hardware, with no gross bladder abnormality. Stomach/Bowel: Small to moderate hiatal hernia. There is moderate gastric distension by oral contrast. Well-positioned enteric tube with tip in the gastric fundus. No gastric wall thickening. There is diffuse mild small bowel dilatation up to 3.5 cm diameter with scattered fluid levels in the small bowel. No small bowel wall thickening or focal small bowel caliber transition. Normal appendix. Mild diffuse distention of the colon with gas and stool. No large bowel wall thickening or pericolonic fat stranding. Vascular/Lymphatic: Atherosclerotic nonaneurysmal abdominal aorta. No pathologically enlarged lymph nodes in the abdomen or pelvis. Reproductive: Status post hysterectomy, with no abnormal findings at the vaginal  cuff. No adnexal mass. Other: No pneumoperitoneum, ascites or focal fluid collection. Musculoskeletal: No aggressive appearing focal osseous lesions. Marked multilevel thoracolumbar degenerative disc disease. Partially visualized right hip hemiarthroplasty. Skin staples are seen lateral to the right hip. Expected postoperative soft tissue gas surrounding the right hip. IMPRESSION: 1. Mild diffuse small and large bowel dilatation with scattered small bowel fluid levels, compatible with postoperative adynamic ileus. No free air. 2. Small to moderate hiatal hernia. Oral contrast in the lower thoracic esophagus, suggesting esophageal dysmotility and/or gastroesophageal reflux. 3. Mild patchy tree-in-bud and ground-glass opacities at the lung bases, suggesting mild aspiration. 4. Small dependent bilateral pleural effusions with mild dependent bibasilar atelectasis. 5.  Aortic Atherosclerosis (ICD10-I70.0).  Coronary atherosclerosis. Electronically Signed   By: Delbert Phenix M.D.   On: 07/07/2017 18:13   Dg Chest 1 View  Result Date: 07/08/2017 CLINICAL DATA:  83 year old female with shortness of breath. Pleural effusions. Recent hip surgery. EXAM: CHEST 1 VIEW COMPARISON:  CT Abdomen and Pelvis 07/07/17. Chest radiograph 07/03/2017. FINDINGS: AP semi upright view at 1423 hours. The patient is rotated to the left. NG tube in place, appears to be looped in the stomach similar to the configuration yesterday. Small bilateral pleural effusions were better demonstrated on the recent CT. Moderate size gastric hiatal hernia, stable mediastinal contours. Calcified aortic atherosclerosis. No pneumothorax, pulmonary edema or other confluent pulmonary opacity. IMPRESSION: Chest appears stable since the CT abdomen yesterday. Small pleural effusions. Stable NG tube. Electronically Signed   By: Odessa Fleming M.D.   On: 07/08/2017 14:49   Dg Chest 1 View  Result Date: 07/03/2017 CLINICAL DATA:  Hip fracture EXAM: CHEST 1 VIEW  COMPARISON:  11/21/2015 FINDINGS: COPD with hyperinflation. Extensive apical scarring bilaterally unchanged. Negative for heart failure or pneumonia. Negative for pleural effusion. IMPRESSION: COPD with apical scarring. No acute abnormality and unchanged from the prior study. Electronically Signed   By: Marlan Palau M.D.   On: 07/03/2017 12:01   Dg Abd 1 View  Result Date: 07/07/2017 CLINICAL DATA:  NG  tube placement. EXAM: ABDOMEN - 1 VIEW COMPARISON:  July 06, 2017 FINDINGS: An NG tube terminates in the left upper quadrant. The air-fluid level in the stomach is no longer seen. An air-filled structure left upper quadrant is likely mildly distended stomach. No obstruction or definitive ileus. IMPRESSION: 1. The NG tube is in good position. The air-fluid level in the stomach seen previously is no longer visualized. The stomach may remain mildly distended. 2. No obstruction or definitive ileus. Electronically Signed   By: Gerome Sam III M.D   On: 07/07/2017 07:25   Ct Head Wo Contrast  Result Date: 07/08/2017 CLINICAL DATA:  83 year old female status post fall onto right side during exercise class. Altered mental status. EXAM: CT HEAD WITHOUT CONTRAST TECHNIQUE: Contiguous axial images were obtained from the base of the skull through the vertex without intravenous contrast. COMPARISON:  Head CT 07/03/2017, brain MRI 11/21/2015, and earlier. FINDINGS: Brain: Chronic infarct with encephalomalacia in the right superior frontal gyrus. Stable gray-white matter differentiation throughout the brain. No midline shift, ventriculomegaly, mass effect, evidence of mass lesion, intracranial hemorrhage or evidence of cortically based acute infarction. No other cortical encephalomalacia identified. Vascular: Calcified atherosclerosis at the skull base. Skull: Stable and intact. Sinuses/Orbits: Left side in G-tube in place. Visualized paranasal sinuses and mastoids are stable and well pneumatized. Other: No acute  orbit or scalp soft tissue findings. IMPRESSION: No acute intracranial abnormality. No acute traumatic injury identified. Stable non contrast CT appearance of the brain, including chronic right MCA infarct. Electronically Signed   By: Odessa Fleming M.D.   On: 07/08/2017 14:46   Ct Head Wo Contrast  Result Date: 07/03/2017 CLINICAL DATA:  Fall at aerobics class. EXAM: CT HEAD WITHOUT CONTRAST TECHNIQUE: Contiguous axial images were obtained from the base of the skull through the vertex without intravenous contrast. COMPARISON:  11/21/2015 FINDINGS: Brain: No evidence of acute infarction, hemorrhage, hydrocephalus, extra-axial collection or mass lesion/mass effect. Chronic right frontal lobe encephalomalacia identified There is mild diffuse low-attenuation within the subcortical and periventricular white matter compatible with chronic microvascular disease. Prominence of the sulci and ventricles noted. Vascular: No hyperdense vessel or unexpected calcification. Skull: Normal. Negative for fracture or focal lesion. Sinuses/Orbits: No acute finding. Other: None. IMPRESSION: 1. No acute intracranial abnormality. 2. Chronic small vessel ischemic disease and brain atrophy. 3. Chronic right frontal lobe infarct. Electronically Signed   By: Signa Kell M.D.   On: 07/03/2017 20:29   US Renal  Result Date: 07/08/2017 CLINICAL DATA:  Acute kidney injury. EXAM: RENAL / URINARY TRACT ULTRASOUND COMPLETE COMPARISON:  None. FINDINGS: Right Kidney: Length: 9.0 cm. Echogenicity within normal limits. No mass or hydronephrosis visualized. Left Kidney: Length: 7.8 cm. Echogenicity within normal limits. No mass or hydronephrosis visualized. Bladder: The bladder is empty with a Foley catheter in place. Incidental note is made of right pleural effusion and liver cysts as demonstrated on prior CT scan. IMPRESSION: Normal appearing kidneys bilaterally. Electronically Signed   By: Francene Boyers M.D.   On: 07/08/2017 15:12   Pelvis  Portable  Result Date: 07/05/2017 CLINICAL DATA:  Postop hip fracture EXAM: PORTABLE PELVIS 1-2 VIEWS COMPARISON:  07/03/2017 FINDINGS: Post right hip replacement. Normal AP alignment. No hardware or bony complicating feature. Mild degenerative changes in the left hip joint. IMPRESSION: Right hip replacement.  No complicating feature. Electronically Signed   By: Charlett Nose M.D.   On: 07/05/2017 10:42   Ct Femur Right Wo Contrast  Result Date: 07/13/2017 CLINICAL DATA:  Postop  right hip arthroplasty for fracture 07/05/2017. Increasing wound drainage and leukocytosis. EXAM: CT OF THE LOWER RIGHT EXTREMITY WITHOUT CONTRAST TECHNIQUE: Multidetector CT imaging of the right thigh was performed according to the standard protocol. COMPARISON:  Radiographs 07/03/2017 and 07/05/2017. Pelvic CT 07/07/2017. FINDINGS: Bones/Joint/Cartilage Patient is status post right hip hemiarthroplasty. The prosthesis is well positioned. There is no evidence of acute fracture, dislocation or bone destruction. The prosthesis creates moderate beam hardening artifact, limiting evaluation of the immediate surrounding soft tissues. No large hip joint effusion identified. There are mild tricompartmental degenerative changes at the knee with meniscal chondrocalcinosis and a tiny Baker's cyst. There is no significant knee joint effusion. Ligaments Not relevant for examination. Muscles and Tendons Postsurgical changes within the right gluteus musculature. There is underlying muscular atrophy and enthesophyte formation, similar to recent CT. There is no focal intramuscular fluid collection or residual soft tissue emphysema. Enthesophytes are present within the proximal hamstring tendons. Soft tissues Deep to the skin staples laterally, there is a lenticular shaped subcutaneous fluid collection measuring up to 2.5 cm in thickness. The superior extent of this collection is not imaged on this examination. Inferiorly, this extends to the level of  the ischial tuberosity (greater than 14 cm cephalocaudad extent). This fluid is superficial to the iliotibial band, ill-defined and similar in size to the postoperative CT of 5 days ago. No other focal fluid collections are identified. There is mild subcutaneous edema throughout the proximal thigh. Mild femoral arterial atherosclerosis noted. IMPRESSION: 1. Subcutaneous fluid collection deep to the skin staples is similar in appearance to the postoperative CT of 5 days prior and nonspecific. This is likely a postoperative hematoma/seroma, although superimposed infection cannot be excluded. 2. No new or enlarging fluid collections or residual soft tissue emphysema. 3. No osseous complication following right hip hemiarthroplasty. No evidence of osteomyelitis. Electronically Signed   By: Carey Bullocks M.D.   On: 07/13/2017 08:20   Dg Chest Port 1 View  Result Date: 07/09/2017 CLINICAL DATA:  Check nasogastric catheter placement EXAM: PORTABLE CHEST 1 VIEW COMPARISON:  07/08/2017 FINDINGS: Nasogastric catheter remains coiled within the stomach. Cardiac shadow is stable. Stable hiatal hernia. Left-sided PICC line is noted in satisfactory position. Small left pleural effusion is again noted. IMPRESSION: Nasogastric catheter remains in the stomach. Remainder the exam is stable. Electronically Signed   By: Alcide Clever M.D.   On: 07/09/2017 10:44   Dg Abd 2 Views  Result Date: 07/06/2017 CLINICAL DATA:  Abdominal distention. EXAM: ABDOMEN - 2 VIEW COMPARISON:  February 14, 2015 FINDINGS: There is an air-fluid level in the left upper quadrant. Statistically, this is most likely to be within a distended stomach. No air under the right hemidiaphragm. Probable hiatal hernia. Lung bases otherwise normal. Rounded density over the left mid abdomen. No bowel obstruction. No other acute abnormalities. Scoliotic and degenerative changes of the spine. IMPRESSION: 1. Air-fluid level over the left upper quadrant is statistically  most likely to represent a stomach distended with gas and fluid. No free air as right hemidiaphragm. 2. There is a rounded density over the left mid abdomen on one view which is nonspecific. This could be artifactual or something on the patient. This was not seen previously. Recommend attention on follow-up. 3. No other acute abnormalities. Electronically Signed   By: Gerome Sam III M.D   On: 07/06/2017 13:35   Dg Hip Unilat With Pelvis 2-3 Views Right  Result Date: 07/03/2017 CLINICAL DATA:  Fall.  Right hip pain EXAM: DG  HIP (WITH OR WITHOUT PELVIS) 2-3V RIGHT COMPARISON:  None. FINDINGS: Right femoral neck fracture with angulation and impaction. Fracture is in the subcapital region of the femoral neck. Calcification of the glenoid labrum. Joint space normal. No other pelvic fracture. Advanced disc degeneration L5-S1 IMPRESSION: Subcapital angulated fracture of the right femur. Electronically Signed   By: Marlan Palau M.D.   On: 07/03/2017 12:03    Time Spent in minutes  30   Pearson Grippe M.D on 07/15/2017 at 8:27 AM  Between 7am to 7pm - Pager - [phone]  After 7pm go to [email or web address] - password [facility]  [facility] -  Office  [phone]

## 2017-07-15 NOTE — Progress Notes (Signed)
Pharmacy Antibiotic Note  Theresa Kennedy is a 83 y.o. female admitted on 07/03/2017 with right femur fractur.  Pharmacy has been consulted for Ancef dosing for E.coli UTI.  Plan: Ancef 1g IV q8h No further dosing adjustments required, Pharmacy will sign off  Height: 5\' 3"  (160 cm) Weight: 113 lb 15.7 oz (51.7 kg) IBW/kg (Calculated) : 52.4  Temp (24hrs), Avg:97.5 F (36.4 C), Min:97.5 F (36.4 C), Max:97.5 F (36.4 C)   Recent Labs Lab 07/10/17 0542  07/11/17 0513 07/12/17 0554 07/13/17 0653 07/13/17 2046 07/14/17 1800 07/15/17 0408  WBC 8.7  < > 13.1* 15.4* 20.3* 21.9* 16.4* 14.5*  CREATININE 0.75  --  0.72 0.65 0.75  --   --  0.65  < > = values in this interval not displayed.  Estimated Creatinine Clearance: 35.9 mL/min (by C-G formula based on SCr of 0.65 mg/dL).    Allergies  Allergen Reactions  . Heparin     HIT positive  . Sulfa Antibiotics Other (See Comments)    unknown    Antimicrobials this admission: 8/26 Rocephin >> 8/27 8/27 Ancef >>  Dose adjustments this admission:   Microbiology results: 8/15 MRSA PCR: neg 8/20 UCx: NGF 8/24 BCx: ngtd 8/25 UCx: >100k E.coli (R amp, unasyn; S ancef, rocephin, cipro, gent, imip, nitro, zosyn, bactrim)   Thank you for allowing pharmacy to be a part of this patient's care.  Loralee Pacas, PharmD, BCPS Pager: [phone] 07/15/2017 10:36 AM

## 2017-07-15 NOTE — Care Management Important Message (Signed)
Important Message  Patient Details  Name: ANWITHA HANLAN MRN: [id number] Date of Birth: 01-25-1921   Medicare Important Message Given:  Yes    Caren Macadam 07/15/2017, 12:09 PMImportant Message  Patient Details  Name: HETTY VOELLER MRN: [id number] Date of Birth: August 05, 1921   Medicare Important Message Given:  Yes    Caren Macadam 07/15/2017, 12:09 PM

## 2017-07-16 LAB — COMPREHENSIVE METABOLIC PANEL
ALBUMIN: 2.7 g/dL — AB (ref 3.5–5.0)
ALK PHOS: 46 U/L (ref 38–126)
ALT: 14 U/L (ref 14–54)
AST: 23 U/L (ref 15–41)
Anion gap: 8 (ref 5–15)
BILIRUBIN TOTAL: 0.8 mg/dL (ref 0.3–1.2)
BUN: 27 mg/dL — AB (ref 6–20)
CALCIUM: 8.4 mg/dL — AB (ref 8.9–10.3)
CO2: 27 mmol/L (ref 22–32)
Chloride: 95 mmol/L — ABNORMAL LOW (ref 101–111)
Creatinine, Ser: 0.68 mg/dL (ref 0.44–1.00)
GFR calc Af Amer: >60 mL/min (ref >60)
GLUCOSE: 98 mg/dL (ref 65–99)
Potassium: 4 mmol/L (ref 3.5–5.1)
Sodium: 130 mmol/L — ABNORMAL LOW (ref 135–145)
TOTAL PROTEIN: 4.9 g/dL — AB (ref 6.5–8.1)

## 2017-07-16 LAB — CBC
HEMATOCRIT: 27.7 % — AB (ref 36.0–46.0)
HEMOGLOBIN: 9.3 g/dL — AB (ref 12.0–15.0)
MCH: 33.3 pg (ref 26.0–34.0)
MCHC: 33.6 g/dL (ref 30.0–36.0)
MCV: 99.3 fL (ref 78.0–100.0)
Platelets: 131 10*3/uL — ABNORMAL LOW (ref 150–400)
RBC: 2.79 MIL/uL — ABNORMAL LOW (ref 3.87–5.11)
RDW: 18.7 % — ABNORMAL HIGH (ref 11.5–15.5)
WBC: 11.7 10*3/uL — ABNORMAL HIGH (ref 4.0–10.5)

## 2017-07-16 MED ORDER — PRO-STAT SUGAR FREE PO LIQD: 30.0000 mL | Freq: Two times a day (BID) | ORAL | 0 refills | Status: DC

## 2017-07-16 MED ORDER — PHENOL 1.4 % MT LIQD
1.0000 | OROMUCOSAL | 0 refills | Status: DC | PRN
Start: 2017-07-16 — End: 2018-02-14

## 2017-07-16 MED ORDER — POLYETHYLENE GLYCOL 3350 17 G PO PACK: 17.0000 g | PACK | Freq: Every day | ORAL | 0 refills | Status: AC | PRN

## 2017-07-16 MED ORDER — CEPHALEXIN 500 MG PO CAPS: 500.0000 mg | ORAL_CAPSULE | Freq: Two times a day (BID) | ORAL | 0 refills | Status: AC

## 2017-07-16 MED ORDER — HYDROCODONE-ACETAMINOPHEN 5-325 MG PO TABS: 1.0000 | ORAL_TABLET | Freq: Four times a day (QID) | ORAL | 0 refills | Status: DC | PRN

## 2017-07-16 MED ORDER — ACETAMINOPHEN 325 MG PO TABS: 650.0000 mg | ORAL_TABLET | Freq: Four times a day (QID) | ORAL | 0 refills | Status: AC | PRN

## 2017-07-16 MED ORDER — BOOST / RESOURCE BREEZE PO LIQD: 1.0000 | Freq: Three times a day (TID) | ORAL | 0 refills | Status: DC

## 2017-07-16 MED ORDER — BETHANECHOL CHLORIDE 10 MG PO TABS: 10.0000 mg | ORAL_TABLET | Freq: Three times a day (TID) | ORAL | 0 refills | Status: DC

## 2017-07-16 NOTE — Clinical Social Work Placement (Signed)
Patient received and accepted bed offer at [facility]. Staff member Elease Hashimoto confirmed patient's bed offer and insurance authorization. [facility] contacted, family notified. Patient's RN can call report to [phone] [employer], packet complete.  CLINICAL SOCIAL WORK PLACEMENT  NOTE  Date:  07/16/2017  Patient Details  Name: Theresa Kennedy MRN: [id number] Date of Birth: Oct 28, 1921  Clinical Social Work is seeking post-discharge placement for this patient at the Skilled  Nursing Facility level of care (*CSW will initial, date and re-position this form in  chart as items are completed):  Yes   Patient/family provided with [facility] Clinical Social Work Department's list of facilities offering this level of care within the geographic area requested by the patient (or if unable, by the patient's family).  Yes   Patient/family informed of their freedom to choose among providers that offer the needed level of care, that participate in Medicare, Medicaid or managed care program needed by the patient, have an available bed and are willing to accept the patient.  Yes   Patient/family informed of [facility] ownership interest in [facility] and [facility], as well as of the fact that they are under no obligation to receive care at these facilities.  PASRR submitted to EDS on       PASRR number received on       Existing PASRR number confirmed on 07/09/17     FL2 transmitted to all facilities in geographic area requested by pt/family on 07/09/17     FL2 transmitted to all facilities within larger geographic area on       Patient informed that his/her managed care company has contracts with or will negotiate with certain facilities, including the following:        Yes   Patient/family informed of bed offers received.  Patient chooses bed at [facility]     Physician recommends and patient chooses bed at      Patient to be transferred to [facility] on 07/16/17.  Patient to be transferred to facility by [facility]     Patient family notified on 07/16/17 of transfer.  Name of family member notified:  [name]     PHYSICIAN       Additional Comment:    _______________________________________________ Antionette Poles, LCSW 07/16/2017, 3:56 PM

## 2017-07-16 NOTE — Progress Notes (Signed)
Nutrition Follow-up  INTERVENTION:  - Continue Boost Breeze TID and Prostat BID. - Recommend Ensure Plus or Boost Plus after d/c, if available at SNF, rather than Boost Breeze and Prostat.  - Continue to encourage PO intakes of meals and supplements.   NUTRITION DIAGNOSIS:   Inadequate oral intake related to poor appetite as evidenced by per patient/family report. -improving  GOAL:   Patient will meet greater than or equal to 90% of their needs -beginning to meet  MONITOR:   PO intake, Supplement acceptance, Weight trends, Labs, Skin  ASSESSMENT:   83 y.o. female with medical history of stroke, essential hypertension, hyperlipidemia, and atrial fibrillation presented with a mechanical fall onto her right side while at an exercise dance class on the morning of 07/03/2017. She had R hip replacement/surgery on 07/05/17 and subsequently developed ileus and NGT placed 8/18-8/22. Critical Hgb on 8/22 with copious bloody drainage from surgical site.    8/28 Pt has been on Soft diet since 8/23 at ~2:50PM. She consumed 100% of breakfast this AM and 50% of breakfast yesterday AM. Intakes have been variable, 25-100%, since previous assessment. Discharge summary, but not discharge order, in for pt to go to SNF. She has been consuming all of Prostat doses and most of Boost Breeze. Weight +7 lbs/3.3 kg from 8/15-8/26 at 1645 with two other weights entered on that same day and first weight of that day (at 1037) being consistent with weight on 8/26.  Medications reviewed; daily multivitamin with minerals.  Labs reviewed; Na: 130 mmol/L, Cl: 95 mmol/L, BUN: 27 mg/dL, Ca: 8.4 mg/dL.    8/23 - She did very well with clear liquids today: 100% of broth, jello, and tea.  - She is willing to try the clear liquid oral nutrition supplement.  - RD emphasized her acute need for both calories and protein given her recent major surgery and the complications associated which has delayed her being able to advance  her diet as hoped.   8/16 - Home diet is regular and she is able to feed herself.  - Denies chewing or swallowing problems. - Her appetite has not been as good this summer which she associates with  the hot weather.  - At home she drinks primarily water.  - She is agreeable to drink Ensure to help support her intake given her increased needs with pending surgery.  - Her weight is down slightly from usual 110# likely related to diminished appetite.  Limited Nutrition-Focused physical exam completed. Findings are mild orbital fat depletion, moderate clavicle muscle depletion   Diet Order:  DIET SOFT Room service appropriate? Yes; Fluid consistency: Thin  Skin:  Reviewed, no issues  Last BM:  8/26  Height:   Ht Readings from Last 1 Encounters:  07/14/17 5\' 3"  (1.6 m)    Weight:   Wt Readings from Last 1 Encounters:  07/14/17 113 lb 15.7 oz (51.7 kg)    Ideal Body Weight:  52 kg  BMI:  Body mass index is 20.19 kg/m.  Estimated Nutritional Needs:   Kcal:  1584-1680 (33-35 kcal/kg)  Protein:  60-65 gr (1.2-1.3 gr/kg)  Fluid:  >1200 ml daily  EDUCATION NEEDS:   Education needs addressed     Trenton Gammon, MS, RD, LDN, CNSC Inpatient Clinical Dietitian Pager # [phone] After hours/weekend pager # [phone]

## 2017-07-16 NOTE — Care Management Note (Signed)
Case Management Note  Patient Details  Name: MASHONDA PASLAY MRN: [id number] Date of Birth: [birth year]  Subjective/Objective:                    Action/Plan:d/c SNF.   Expected Discharge Date:                  Expected Discharge Plan:  Skilled Nursing Facility  In-House Referral:  Clinical Social Work  Discharge planning Services  CM Consult  Post Acute Care Choice:  NA Choice offered to:  NA  DME Arranged:    DME Agency:     HH Arranged:    HH Agency:     Status of Service:  Completed, signed off  If discussed at [employer], dates discussed:    Additional Comments:  Lanier Clam, RN 07/16/2017, 1:25 PM

## 2017-07-16 NOTE — Discharge Summary (Signed)
Theresa Kennedy, is a 83 y.o. female  DOB 1920-12-24  MRN [id number].  Admission date:  07/03/2017  Admitting Physician  Catarina Hartshorn, MD  Discharge Date:  07/16/2017   Primary MD  Joette Catching, MD  Recommendations for primary care physician for things to follow:    Subcapital right femur fracture  R-hip bipolar replacement 8/17 pt reportedly has a bed available for SNF placement on 8/25 - w/ slowly climbing WBC and drainage from wound will check CT of the thigh to r/o seroma or abscess formation - no purulent d/c from wound or local erythema appreciable No bleeding this AM CT scan => IMPRESSION: 1. Subcutaneous fluid collection deep to the skin staples is similar in appearance to the postoperative CT of 5 days prior and nonspecific. This is likely a postoperative hematoma/seroma, although superimposed infection cannot be excluded. 2. No new or enlarging fluid collections or residual soft tissue emphysema. 3. No osseous complication following right hip hemiarthroplasty. No evidence of osteomyelitis. 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 Coli  started on Rocephin 8/26=>8/27, Ancef 8/27=>8/28 Keflex 500mg  po bid x 5 days   IIeus resolved  Try to avoid narcotics if possible  AKI renal US revealed no  hydronephrosis - baseline creatinine 0.8-0.9 - creatinine stable and normal presently   Acute metabolic encephalopathy multifactorial including AKI, ileus, opioids - CT brain neg for acute findings - UA unrevealing - B12 702 - ammonia 14 - appears to be back to her baseline mental status  Acute respiratory failure with hypoxia felt to be due to hypoventilation - resolved - wean to room air  Atrial fibrillation  Chadsvasc is 6 - rate controlled - plan was to resumeapixaban 8/19w/ Ortho blessing - switched to IV heparin as was unable to take po - heparin stopped 822 due to concern for bleeding at surgical site - now on DOAC => will STOP Eliquis 8/25  due to oozing from incision and thrombocytopenia Restart Eliquis on 8/27    Acute urinaryretention foley placed 8/20 - urecholine started 8/23 - d/c foley 8/24 , doing well  HTN BP reasonably controlled  Hx of stroke 2014 pt without difficulty in performing ADLs per baseline - 07/03/2017 CT brain negative for acute findings noting old right frontal lobe infarct  Hyponatremia chronic - may be reflective of a degree of modest volume overload, though pt has no hx of CHF - one  time dose of lasix today -  This AM Na=130, likely due to lasix check cmp in am  Goal of care Dr. Arbutus Leas discussed with nephew (HPOA) who confirmed patient is DNR  Consultants:  [facility]  Antimicrobials: Rocephin 8/26=>8/27 Ancef 8/27=>8/28    Admission Diagnosis  Closed subcapital fracture of right femur, initial encounter (HCC) [S72.011A]   Discharge Diagnosis  Closed subcapital fracture of right femur, initial encounter (HCC) [S72.011A]    Active Problems:   Hyponatremia   Ileus, postoperative (HCC)   Other fracture of right femur, initial encounter for closed fracture [facility])   Atrial fibrillation (HCC)   Essential hypertension   Abdominal distension   Adynamic ileus (HCC)   Hip fracture (HCC)   AKI (acute  kidney injury) (HCC)   Acute metabolic encephalopathy   Thrombocytopenia (HCC)      Past Medical History:  Diagnosis Date  . Atrial fibrillation (HCC)   . GERD (gastroesophageal reflux disease)   . Hypertension   . Stroke [facility])     Past Surgical History:  Procedure Laterality Date  . ABDOMINAL HYSTERECTOMY    . HIP ARTHROPLASTY Right 07/05/2017   Procedure: ARTHROPLASTY BIPOLAR HIP (HEMIARTHROPLASTY);  Surgeon: Vickki Hearing, MD;  Location: [facility]S;  Service: Orthopedics;  Laterality: Right;       HPI  from the history and physical done on the day of admission:    83 y.o. female with medical history of stroke, essential hypertension, hyperlipidemia, and atrial fibrillation presented with a mechanical fall onto her right side while at an exercise dance class on the morning of 07/03/2017. The patient states that her feet got tangled up and resulted in a mechanical fall onto her right side. Because she had immense pain, and she was not able to get up, EMS was activated. Patient denies fevers, chills, headache, chest pain, dyspnea, nausea, vomiting, diarrhea, abdominal pain, dysuria, hematuria, hematochezia, and melena. In the emergency department, BMP and CBC were essentially unremarkable except for a sodium of 133. Chest x-ray showed apical scarring and hyperinflation. X-ray of the right hip revealed a subcapital angulated fracture of the right femur. EKG showed atrial fibrillation with nonspecific T-wave changes.       Hospital Course:   Pt was admitted for subcapital right femur fracture.  Dr. [facility] consulted apixaban was discontinued and pt started on heparin gtt .  Pt was continued on metoprolol for rate control of her afib and amlodipine and losartan for bp control.    8/17 taken to OR [facility]) with bipolar hip hemiarthroplasty w [place] basic 49 head and + 5 neck 2 stem direct lateral approach.    On 8/18 pt complained of abdominal discomfort and  distention.  Xray 8/18=> air fluid level likely to represent stomach distended with gas and fluid.  CT scan abd/ pelvis 8/19=>  1. Mild diffuse small and large bowel dilatation with scattered small bowel fluid levels, compatible with postoperative adynamic ileus. No free air. 2. Small to moderate hiatal hernia. Oral contrast in the lower thoracic esophagus, suggesting esophageal dysmotility and/or gastroesophageal reflux. 3. Mild patchy tree-in-bud and ground-glass opacities at the lung bases, suggesting mild aspiration. 4. Small dependent bilateral pleural effusions with mild dependent bibasilar atelectasis. 5.  Aortic Atherosclerosis (ICD10-I70.0).  Coronary atherosclerosis.  Diet changed to NPO, except ice chips and sips, Narcotics held.  NGT to low intermittent suction. Removed on 8/22.   Eliquis was resumed for Afib on 8/19  Creatinine began to rise on  8/19 and hydration initiated w Normal Saline.   Renal ultrasound 8/20=> negative for hydronephrosis AMS on 8/20, CT brain => no acute intracranial abnormality,  CXR 8/20=> stable, small pleural effusions  Bruising and hematoma formation noted in the right hip, and right lower abdomen/ flank area on 8/22.  Hgb= 6.8 (8/22), pt was transfused with 1 unit  Plt 67 (8/22) , prev 123.  Pt was thought to have HIT, antibody test (8/22)  ++  Heparin products avoided.  Plt 67 (8/22)=> 45 (8/23)=>38 (8/24) => 28 (8/25)=> 11 (8/25),  Eliquis was stopped on 8/24 evening.  DIC panel sent, => negative,LDH=263,   Transferred to [facility] on 8/26 for evaluation of thrombocytopenia by oncology who thought that this thrombocytopenia due to HIT. Recommended holding eliquis.  Her plt improved on 8/27 to 129 and Eliquis restarted.     Wbc increased 20.3  , and ua on 8/25 => +++, and pt initiated on rocephin.  Leukocytosis improved with abx.  Urine culture => E. Coli, sensitive to ancef Pt converted to ancef.  8/27.    Pt appears to be doing well  and stable and would like to be discharged to [facility] today. Plt 131 (8/28)  on Elquis.       Follow UP  Contact information for after-discharge care    Destination    [facility] .   Specialty:  Skilled Nursing Facility Contact information: [address] [place] [address] [phone]               Consults obtained - orthopedics, oncology  Discharge Condition: stable  Diet and Activity recommendation: See Discharge Instructions below  Discharge Instructions         Discharge Medications     Allergies as of 07/16/2017      Reactions   Heparin    HIT positive   Sulfa Antibiotics Other (See Comments)   unknown      Medication List    STOP taking these medications   losartan 25 MG tablet Commonly known as:  COZAAR     TAKE these medications   acetaminophen 325 MG tablet Commonly known as:  TYLENOL Take 2 tablets (650 mg total) by mouth every 6 (six) hours as needed for mild pain (or Fever >/= 101).   amLODipine 10 MG tablet Commonly known as:  NORVASC Take 10 mg by mouth daily.   apixaban 2.5 MG Tabs tablet Commonly known as:  ELIQUIS Take 1 tablet (2.5 mg total) by mouth 2 (two) times daily.   atorvastatin 10 MG tablet Commonly known as:  LIPITOR Take 10 mg by mouth daily at 6 PM.   bethanechol 10 MG tablet Commonly known as:  URECHOLINE Take 1 tablet (10 mg total) by mouth 3 (three) times daily.   Calcium Carbonate-Vitamin D 600-200 MG-UNIT Tabs Take 1 tablet by mouth daily.   carbamide peroxide 6.5 % OTIC solution Commonly known as:  DEBROX Place 5 drops into both ears 2 (two) times daily. What changed:  when to take this  additional instructions   cephALEXin 500 MG capsule Commonly known as:  KEFLEX Take 1 capsule (500 mg total) by mouth 2 (two) times daily.   diphenhydrAMINE 25 MG tablet Commonly known as:  BENADRYL Take 25 mg by mouth every 6 (six) hours as needed for itching.     diphenhydrAMINE-zinc acetate cream Commonly known as:  BENADRYL Apply 1 application topically 2 (two) times daily as needed for itching.  docusate sodium 100 MG capsule Commonly known as:  COLACE Take 1 capsule (100 mg total) by mouth daily.   feeding supplement (PRO-STAT SUGAR FREE 64) Liqd Take 30 mLs by mouth 2 (two) times daily.   feeding supplement Liqd Take 1 Container by mouth 3 (three) times daily between meals.   Fish Oil 1000 MG Caps Take 1 capsule by mouth 2 (two) times daily.   Grape Seed Extract 100 MG Caps Take 100 mg by mouth daily.   HYDROcodone-acetaminophen 5-325 MG tablet Commonly known as:  NORCO/VICODIN Take 1-2 tablets by mouth every 6 (six) hours as needed for moderate pain.   metoprolol tartrate 25 MG tablet Commonly known as:  LOPRESSOR Take 25 mg by mouth 2 (two) times daily.   multivitamin with minerals Tabs tablet Take 1 tablet by mouth daily.   phenol 1.4 % Liqd Commonly known as:  CHLORASEPTIC Use as directed 1 spray in the mouth or throat as needed for throat irritation / pain.   polyethylene glycol packet Commonly known as:  MIRALAX / GLYCOLAX Take 17 g by mouth daily as needed for mild constipation.   psyllium 58.6 % packet Commonly known as:  METAMUCIL Take 1 packet by mouth daily.   ranitidine 150 MG capsule Commonly known as:  ZANTAC Take 150 mg by mouth 2 (two) times daily.            Discharge Care Instructions        Start     Ordered   07/16/17 0000  acetaminophen (TYLENOL) 325 MG tablet  Every 6 hours PRN     07/16/17 0748   07/16/17 0000  bethanechol (URECHOLINE) 10 MG tablet  3 times daily     07/16/17 0748   07/16/17 0000  feeding supplement (BOOST / RESOURCE BREEZE) LIQD  3 times daily between meals     07/16/17 0748   07/16/17 0000  Amino Acids-Protein Hydrolys (FEEDING SUPPLEMENT, PRO-STAT SUGAR FREE 64,) LIQD  2 times daily     07/16/17 0748   07/16/17 0000  HYDROcodone-acetaminophen (NORCO/VICODIN)  5-325 MG tablet  Every 6 hours PRN     07/16/17 0748   07/16/17 0000  phenol (CHLORASEPTIC) 1.4 % LIQD  As needed     07/16/17 0748   07/16/17 0000  polyethylene glycol (MIRALAX / GLYCOLAX) packet  Daily PRN     07/16/17 0748   07/16/17 0000  cephALEXin (KEFLEX) 500 MG capsule  2 times daily     07/16/17 0748      Major procedures and Radiology Reports - PLEASE review detailed and final reports for all details, in brief -      Ct Abdomen Pelvis Wo Contrast  Result Date: 07/07/2017 CLINICAL DATA:  Inpatient. Abdominal distention and pain. Recent mechanical fall with right hip fracture and subsequent right hip arthroplasty. History of hysterectomy. EXAM: CT ABDOMEN AND PELVIS WITHOUT CONTRAST TECHNIQUE: Multidetector CT imaging of the abdomen and pelvis was performed following the standard protocol without IV contrast. COMPARISON:  02/12/2015 CT abdomen/ pelvis. Abdominal radiograph from earlier today. FINDINGS: Lower chest: Small dependent bilateral pleural effusions with associated mild-to-moderate compressive atelectasis in the dependent lower lobes. Mild patchy tree-in-bud opacities and ground-glass opacities in the bilateral lower lobes. Coronary atherosclerosis. Oral contrast in the lower thoracic esophagus. Hepatobiliary: Two simple liver cysts, largest 4.2 cm centrally. No additional liver lesions. Normal liver size. Normal gallbladder with no radiopaque cholelithiasis. No definite biliary ductal dilatation. Common bile duct diameter 7 mm, probably normal for age.  Pancreas: Normal, with no mass or duct dilation. Spleen: Normal size. No mass. Adrenals/Urinary Tract: Normal adrenals. No renal stones. No hydronephrosis. No contour deforming renal masses. Limited visualization of the mildly distended bladder due to streak artifact from the right hip hardware, with no gross bladder abnormality. Stomach/Bowel: Small to moderate hiatal hernia. There is moderate gastric distension by oral contrast.  Well-positioned enteric tube with tip in the gastric fundus. No gastric wall thickening. There is diffuse mild small bowel dilatation up to 3.5 cm diameter with scattered fluid levels in the small bowel. No small bowel wall thickening or focal small bowel caliber transition. Normal appendix. Mild diffuse distention of the colon with gas and stool. No large bowel wall thickening or pericolonic fat stranding. Vascular/Lymphatic: Atherosclerotic nonaneurysmal abdominal aorta. No pathologically enlarged lymph nodes in the abdomen or pelvis. Reproductive: Status post hysterectomy, with no abnormal findings at the vaginal cuff. No adnexal mass. Other: No pneumoperitoneum, ascites or focal fluid collection. Musculoskeletal: No aggressive appearing focal osseous lesions. Marked multilevel thoracolumbar degenerative disc disease. Partially visualized right hip hemiarthroplasty. Skin staples are seen lateral to the right hip. Expected postoperative soft tissue gas surrounding the right hip. IMPRESSION: 1. Mild diffuse small and large bowel dilatation with scattered small bowel fluid levels, compatible with postoperative adynamic ileus. No free air. 2. Small to moderate hiatal hernia. Oral contrast in the lower thoracic esophagus, suggesting esophageal dysmotility and/or gastroesophageal reflux. 3. Mild patchy tree-in-bud and ground-glass opacities at the lung bases, suggesting mild aspiration. 4. Small dependent bilateral pleural effusions with mild dependent bibasilar atelectasis. 5.  Aortic Atherosclerosis (ICD10-I70.0).  Coronary atherosclerosis. Electronically Signed   By: Delbert Phenix M.D.   On: 07/07/2017 18:13   Dg Chest 1 View  Result Date: 07/08/2017 CLINICAL DATA:  83 year old female with shortness of breath. Pleural effusions. Recent hip surgery. EXAM: CHEST 1 VIEW COMPARISON:  CT Abdomen and Pelvis 07/07/17. Chest radiograph 07/03/2017. FINDINGS: AP semi upright view at 1423 hours. The patient is rotated to the  left. NG tube in place, appears to be looped in the stomach similar to the configuration yesterday. Small bilateral pleural effusions were better demonstrated on the recent CT. Moderate size gastric hiatal hernia, stable mediastinal contours. Calcified aortic atherosclerosis. No pneumothorax, pulmonary edema or other confluent pulmonary opacity. IMPRESSION: Chest appears stable since the CT abdomen yesterday. Small pleural effusions. Stable NG tube. Electronically Signed   By: Odessa Fleming M.D.   On: 07/08/2017 14:49   Dg Chest 1 View  Result Date: 07/03/2017 CLINICAL DATA:  Hip fracture EXAM: CHEST 1 VIEW COMPARISON:  11/21/2015 FINDINGS: COPD with hyperinflation. Extensive apical scarring bilaterally unchanged. Negative for heart failure or pneumonia. Negative for pleural effusion. IMPRESSION: COPD with apical scarring. No acute abnormality and unchanged from the prior study. Electronically Signed   By: Marlan Palau M.D.   On: 07/03/2017 12:01   Dg Abd 1 View  Result Date: 07/07/2017 CLINICAL DATA:  NG tube placement. EXAM: ABDOMEN - 1 VIEW COMPARISON:  July 06, 2017 FINDINGS: An NG tube terminates in the left upper quadrant. The air-fluid level in the stomach is no longer seen. An air-filled structure left upper quadrant is likely mildly distended stomach. No obstruction or definitive ileus. IMPRESSION: 1. The NG tube is in good position. The air-fluid level in the stomach seen previously is no longer visualized. The stomach may remain mildly distended. 2. No obstruction or definitive ileus. Electronically Signed   By: Gerome Sam III M.D   On: 07/07/2017 07:25  Ct Head Wo Contrast  Result Date: 07/08/2017 CLINICAL DATA:  83 year old female status post fall onto right side during exercise class. Altered mental status. EXAM: CT HEAD WITHOUT CONTRAST TECHNIQUE: Contiguous axial images were obtained from the base of the skull through the vertex without intravenous contrast. COMPARISON:  Head CT  07/03/2017, brain MRI 11/21/2015, and earlier. FINDINGS: Brain: Chronic infarct with encephalomalacia in the right superior frontal gyrus. Stable gray-white matter differentiation throughout the brain. No midline shift, ventriculomegaly, mass effect, evidence of mass lesion, intracranial hemorrhage or evidence of cortically based acute infarction. No other cortical encephalomalacia identified. Vascular: Calcified atherosclerosis at the skull base. Skull: Stable and intact. Sinuses/Orbits: Left side in G-tube in place. Visualized paranasal sinuses and mastoids are stable and well pneumatized. Other: No acute orbit or scalp soft tissue findings. IMPRESSION: No acute intracranial abnormality. No acute traumatic injury identified. Stable non contrast CT appearance of the brain, including chronic right MCA infarct. Electronically Signed   By: Odessa Fleming M.D.   On: 07/08/2017 14:46   Ct Head Wo Contrast  Result Date: 07/03/2017 CLINICAL DATA:  Fall at aerobics class. EXAM: CT HEAD WITHOUT CONTRAST TECHNIQUE: Contiguous axial images were obtained from the base of the skull through the vertex without intravenous contrast. COMPARISON:  11/21/2015 FINDINGS: Brain: No evidence of acute infarction, hemorrhage, hydrocephalus, extra-axial collection or mass lesion/mass effect. Chronic right frontal lobe encephalomalacia identified There is mild diffuse low-attenuation within the subcortical and periventricular white matter compatible with chronic microvascular disease. Prominence of the sulci and ventricles noted. Vascular: No hyperdense vessel or unexpected calcification. Skull: Normal. Negative for fracture or focal lesion. Sinuses/Orbits: No acute finding. Other: None. IMPRESSION: 1. No acute intracranial abnormality. 2. Chronic small vessel ischemic disease and brain atrophy. 3. Chronic right frontal lobe infarct. Electronically Signed   By: Signa Kell M.D.   On: 07/03/2017 20:29   US Renal  Result Date:  07/08/2017 CLINICAL DATA:  Acute kidney injury. EXAM: RENAL / URINARY TRACT ULTRASOUND COMPLETE COMPARISON:  None. FINDINGS: Right Kidney: Length: 9.0 cm. Echogenicity within normal limits. No mass or hydronephrosis visualized. Left Kidney: Length: 7.8 cm. Echogenicity within normal limits. No mass or hydronephrosis visualized. Bladder: The bladder is empty with a Foley catheter in place. Incidental note is made of right pleural effusion and liver cysts as demonstrated on prior CT scan. IMPRESSION: Normal appearing kidneys bilaterally. Electronically Signed   By: Francene Boyers M.D.   On: 07/08/2017 15:12   Pelvis Portable  Result Date: 07/05/2017 CLINICAL DATA:  Postop hip fracture EXAM: PORTABLE PELVIS 1-2 VIEWS COMPARISON:  07/03/2017 FINDINGS: Post right hip replacement. Normal AP alignment. No hardware or bony complicating feature. Mild degenerative changes in the left hip joint. IMPRESSION: Right hip replacement.  No complicating feature. Electronically Signed   By: Charlett Nose M.D.   On: 07/05/2017 10:42   Ct Femur Right Wo Contrast  Result Date: 07/13/2017 CLINICAL DATA:  Postop right hip arthroplasty for fracture 07/05/2017. Increasing wound drainage and leukocytosis. EXAM: CT OF THE LOWER RIGHT EXTREMITY WITHOUT CONTRAST TECHNIQUE: Multidetector CT imaging of the right thigh was performed according to the standard protocol. COMPARISON:  Radiographs 07/03/2017 and 07/05/2017. Pelvic CT 07/07/2017. FINDINGS: Bones/Joint/Cartilage Patient is status post right hip hemiarthroplasty. The prosthesis is well positioned. There is no evidence of acute fracture, dislocation or bone destruction. The prosthesis creates moderate beam hardening artifact, limiting evaluation of the immediate surrounding soft tissues. No large hip joint effusion identified. There are mild tricompartmental degenerative changes at the  knee with meniscal chondrocalcinosis and a tiny Baker's cyst. There is no significant knee joint  effusion. Ligaments Not relevant for examination. Muscles and Tendons Postsurgical changes within the right gluteus musculature. There is underlying muscular atrophy and enthesophyte formation, similar to recent CT. There is no focal intramuscular fluid collection or residual soft tissue emphysema. Enthesophytes are present within the proximal hamstring tendons. Soft tissues Deep to the skin staples laterally, there is a lenticular shaped subcutaneous fluid collection measuring up to 2.5 cm in thickness. The superior extent of this collection is not imaged on this examination. Inferiorly, this extends to the level of the ischial tuberosity (greater than 14 cm cephalocaudad extent). This fluid is superficial to the iliotibial band, ill-defined and similar in size to the postoperative CT of 5 days ago. No other focal fluid collections are identified. There is mild subcutaneous edema throughout the proximal thigh. Mild femoral arterial atherosclerosis noted. IMPRESSION: 1. Subcutaneous fluid collection deep to the skin staples is similar in appearance to the postoperative CT of 5 days prior and nonspecific. This is likely a postoperative hematoma/seroma, although superimposed infection cannot be excluded. 2. No new or enlarging fluid collections or residual soft tissue emphysema. 3. No osseous complication following right hip hemiarthroplasty. No evidence of osteomyelitis. Electronically Signed   By: Carey Bullocks M.D.   On: 07/13/2017 08:20   Dg Chest Port 1 View  Result Date: 07/09/2017 CLINICAL DATA:  Check nasogastric catheter placement EXAM: PORTABLE CHEST 1 VIEW COMPARISON:  07/08/2017 FINDINGS: Nasogastric catheter remains coiled within the stomach. Cardiac shadow is stable. Stable hiatal hernia. Left-sided PICC line is noted in satisfactory position. Small left pleural effusion is again noted. IMPRESSION: Nasogastric catheter remains in the stomach. Remainder the exam is stable. Electronically Signed    By: Alcide Clever M.D.   On: 07/09/2017 10:44   Dg Abd 2 Views  Result Date: 07/06/2017 CLINICAL DATA:  Abdominal distention. EXAM: ABDOMEN - 2 VIEW COMPARISON:  February 14, 2015 FINDINGS: There is an air-fluid level in the left upper quadrant. Statistically, this is most likely to be within a distended stomach. No air under the right hemidiaphragm. Probable hiatal hernia. Lung bases otherwise normal. Rounded density over the left mid abdomen. No bowel obstruction. No other acute abnormalities. Scoliotic and degenerative changes of the spine. IMPRESSION: 1. Air-fluid level over the left upper quadrant is statistically most likely to represent a stomach distended with gas and fluid. No free air as right hemidiaphragm. 2. There is a rounded density over the left mid abdomen on one view which is nonspecific. This could be artifactual or something on the patient. This was not seen previously. Recommend attention on follow-up. 3. No other acute abnormalities. Electronically Signed   By: Gerome Sam III M.D   On: 07/06/2017 13:35   Dg Hip Unilat With Pelvis 2-3 Views Right  Result Date: 07/03/2017 CLINICAL DATA:  Fall.  Right hip pain EXAM: DG HIP (WITH OR WITHOUT PELVIS) 2-3V RIGHT COMPARISON:  None. FINDINGS: Right femoral neck fracture with angulation and impaction. Fracture is in the subcapital region of the femoral neck. Calcification of the glenoid labrum. Joint space normal. No other pelvic fracture. Advanced disc degeneration L5-S1 IMPRESSION: Subcapital angulated fracture of the right femur. Electronically Signed   By: Marlan Palau M.D.   On: 07/03/2017 12:03    Micro Results     Recent Results (from the past 240 hour(s))  Urine Culture     Status: None   Collection Time: 07/08/17 10:43  AM  Result Value Ref Range Status   Specimen Description URINE, CLEAN CATCH  Final   Special Requests NONE  Final   Culture   Final    NO GROWTH Performed at [facility], [address]  [place], [place] [address]    Report Status 07/10/2017 FINAL  Final  Culture, blood (routine x 2)     Status: None (Preliminary result)   Collection Time: 07/12/17  4:32 PM  Result Value Ref Range Status   Specimen Description RIGHT ANTECUBITAL  Final   Special Requests Blood Culture adequate volume  Final   Culture NO GROWTH 3 DAYS  Final   Report Status PENDING  Incomplete  Culture, blood (routine x 2)     Status: None (Preliminary result)   Collection Time: 07/12/17  4:50 PM  Result Value Ref Range Status   Specimen Description BLOOD PICC LINE DRAW  Final   Special Requests Blood Culture adequate volume  Final   Culture NO GROWTH 3 DAYS  Final   Report Status PENDING  Incomplete  Urine Culture     Status: Abnormal   Collection Time: 07/13/17  5:30 AM  Result Value Ref Range Status   Specimen Description URINE, CLEAN CATCH  Final   Special Requests NONE  Final   Culture >=100,000 COLONIES/mL ESCHERICHIA COLI (A)  Final   Report Status 07/15/2017 FINAL  Final   Organism ID, Bacteria ESCHERICHIA COLI (A)  Final      Susceptibility   Escherichia coli - MIC*    AMPICILLIN >=32 RESISTANT Resistant     CEFAZOLIN 8 SENSITIVE Sensitive     CEFTRIAXONE <=1 SENSITIVE Sensitive     CIPROFLOXACIN <=0.25 SENSITIVE Sensitive     GENTAMICIN <=1 SENSITIVE Sensitive     IMIPENEM <=0.25 SENSITIVE Sensitive     NITROFURANTOIN <=16 SENSITIVE Sensitive     TRIMETH/SULFA <=20 SENSITIVE Sensitive     AMPICILLIN/SULBACTAM >=32 RESISTANT Resistant     PIP/TAZO <=4 SENSITIVE Sensitive     Extended ESBL NEGATIVE Sensitive     * >=100,000 COLONIES/mL ESCHERICHIA COLI       Today   Subjective    Theresa Kennedy today has been doing well, no bleeding  No headache,no chest abdominal pain,no new weakness tingling or numbness, feels much better wants to go SNF today   Objective   Blood pressure 127/74, pulse 67, temperature 97.8 F (36.6 C), temperature source Oral, resp. rate 17, height 5\' 3"  (1.6  m), weight 51.7 kg (113 lb 15.7 oz), SpO2 92 %.   Intake/Output Summary (Last 24 hours) at 07/16/17 0749 Last data filed at 07/16/17 0504  Gross per 24 hour  Intake              380 ml  Output              300 ml  Net               80 ml    Exam Awake Alert, Oriented x 3, No new F.N deficits, Normal affect [place].AT,PERRAL Supple Neck,No JVD, No cervical lymphadenopathy appriciated.  Symmetrical Chest wall movement, Good air movement bilaterally, CTAB RRR,No Gallops,Rubs or new Murmurs, No Parasternal Heave +ve B.Sounds, Abd Soft, Non tender, No organomegaly appriciated, No rebound -guarding or rigidity. No Cyanosis, Clubbing or edema, No new Rash or bruise Incision c/d/i   Staples intact    Data Review   CBC w Diff:  Lab Results  Component Value Date   WBC  11.7 (H) 07/16/2017   HGB 9.3 (L) 07/16/2017   HCT 27.7 (L) 07/16/2017   PLT 131 (L) 07/16/2017   LYMPHOPCT 23 07/03/2017   MONOPCT 7 07/03/2017   EOSPCT 2 07/03/2017   BASOPCT 0 07/03/2017    CMP:  Lab Results  Component Value Date   NA 130 (L) 07/16/2017   K 4.0 07/16/2017   CL 95 (L) 07/16/2017   CO2 27 07/16/2017   BUN 27 (H) 07/16/2017   CREATININE 0.68 07/16/2017   PROT 4.9 (L) 07/16/2017   PROT 6.1 03/06/2016   ALBUMIN 2.7 (L) 07/16/2017   ALBUMIN 4.1 03/06/2016   BILITOT 0.8 07/16/2017   BILITOT 0.4 03/06/2016   ALKPHOS 46 07/16/2017   AST 23 07/16/2017   ALT 14 07/16/2017  .   Total Time in preparing paper work, data evaluation and todays exam - 35 minutes  Pearson Grippe M.D on 07/16/2017 at 7:49 AM  [facility]   Office  [phone]

## 2017-07-17 LAB — CULTURE, BLOOD (ROUTINE X 2)
CULTURE: NO GROWTH
Culture: NO GROWTH
SPECIAL REQUESTS: ADEQUATE
Special Requests: ADEQUATE

## 2017-07-29 ENCOUNTER — Ambulatory Visit (INDEPENDENT_AMBULATORY_CARE_PROVIDER_SITE_OTHER): Payer: Medicare HMO | Admitting: Orthopedic Surgery

## 2017-07-29 DIAGNOSIS — Z4889 Encounter for other specified surgical aftercare: Secondary | ICD-10-CM

## 2017-07-29 NOTE — Progress Notes (Signed)
Patient ID: Theresa Kennedy, female   DOB: 05/18/1921, 83 y.o.   MRN: [id number]  POSTOP VISIT   Chief complaint August 17 right femoral neck fracture treated with the [place] basic bipolar press-fit stem  HPI 83 year old female status post fall had surgical repair with bipolar replacement OP REPORT  She's at the [facility]. 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  Marland Kitchen. Aftercare following surgery Yes    2 month follow-up  10:18 AM Fuller CanadaStanley Chancie Lampert, MD 07/29/2017

## 2017-09-17 ENCOUNTER — Telehealth: Payer: Self-pay | Admitting: Orthopedic Surgery

## 2017-09-17 NOTE — Telephone Encounter (Signed)
Bipolar no antibiotics needed

## 2017-09-17 NOTE — Telephone Encounter (Signed)
Patient's dentist office, Dr. Sophronia SimasBurris called today and wanted to know if Theresa Kennedy needs to take antibiotics before having a filling in her tooth done.  She had a bipolar hip done on 07-05-17.  Please call Kayla at Dr. Sophronia SimasBurris' office at [phone]  Thanks

## 2017-09-17 NOTE — Telephone Encounter (Signed)
Will you review dental protocol with me? According to what I found for your protocol you want Keflex 500mg  4 tabs before dental procedure, is this correct?

## 2017-09-18 NOTE — Telephone Encounter (Signed)
Thanks I called patient to advise.

## 2017-10-02 ENCOUNTER — Ambulatory Visit: Payer: Medicare HMO | Admitting: Orthopedic Surgery

## 2017-11-06 ENCOUNTER — Ambulatory Visit (INDEPENDENT_AMBULATORY_CARE_PROVIDER_SITE_OTHER): Payer: Medicare HMO | Admitting: Orthopedic Surgery

## 2017-11-06 ENCOUNTER — Ambulatory Visit (INDEPENDENT_AMBULATORY_CARE_PROVIDER_SITE_OTHER): Payer: Medicare HMO

## 2017-11-06 ENCOUNTER — Encounter: Payer: Self-pay | Admitting: Orthopedic Surgery

## 2017-11-06 DIAGNOSIS — S72011D Unspecified intracapsular fracture of right femur, subsequent encounter for closed fracture with routine healing: Secondary | ICD-10-CM | POA: Diagnosis not present

## 2017-11-06 NOTE — Progress Notes (Signed)
Progress note  Chief Complaint  Patient presents with  . Routine Post Op    hip bipolar 07/05/17    4 months after bipolar hip replacement patient complains of thigh pain  She is ambulatory with a walker.  [facility]  Review of Systems  Constitutional: Negative for fever.  Neurological: Negative for tingling and sensory change.   There were no vitals taken for this visit.  Physical Exam  Constitutional: She is oriented to person, place, and time. She appears well-developed and well-nourished.  Neurological: She is alert and oriented to person, place, and time.  Psychiatric: She has a normal mood and affect. Judgment normal.  Vitals reviewed. Her gait is supported by a walker  Examination shows tenderness in the thigh no pain over the greater trochanter.  Leg lengths are relatively equal.  Hip flexion is normal.  No pain with flexion-extension of the knee, hip flexion normal.  X-ray pelvis and hip table hip prosthesis no abnormality seen  Left side shows some sclerosis in the bone femoral head otherwise looks normal.  Superior lateral margin of the acetabulum may show some mild arthrosis  Follow-up as needed  Encounter Diagnosis  Name Primary?  . Closed subcapital fracture of right femur with routine healing, subsequent encounter bipolar 07/05/17 Yes

## 2018-02-14 ENCOUNTER — Emergency Department (HOSPITAL_COMMUNITY): Payer: Medicare HMO

## 2018-02-14 ENCOUNTER — Encounter (HOSPITAL_COMMUNITY): Payer: Self-pay | Admitting: Emergency Medicine

## 2018-02-14 ENCOUNTER — Observation Stay (HOSPITAL_COMMUNITY)
Admission: EM | Admit: 2018-02-14 | Discharge: 2018-02-15 | Disposition: A | Discharge status: Home / Self Care | Payer: Medicare HMO | Attending: Internal Medicine | Admitting: Internal Medicine

## 2018-02-14 DIAGNOSIS — Z96641 Presence of right artificial hip joint: Secondary | ICD-10-CM | POA: Diagnosis not present

## 2018-02-14 DIAGNOSIS — Z79899 Other long term (current) drug therapy: Secondary | ICD-10-CM | POA: Insufficient documentation

## 2018-02-14 DIAGNOSIS — I4819 Other persistent atrial fibrillation: Secondary | ICD-10-CM | POA: Diagnosis present

## 2018-02-14 DIAGNOSIS — I63411 Cerebral infarction due to embolism of right middle cerebral artery: Principal | ICD-10-CM | POA: Insufficient documentation

## 2018-02-14 DIAGNOSIS — Z7901 Long term (current) use of anticoagulants: Secondary | ICD-10-CM | POA: Insufficient documentation

## 2018-02-14 DIAGNOSIS — I63511 Cerebral infarction due to unspecified occlusion or stenosis of right middle cerebral artery: Secondary | ICD-10-CM | POA: Diagnosis present

## 2018-02-14 DIAGNOSIS — I10 Essential (primary) hypertension: Secondary | ICD-10-CM | POA: Diagnosis present

## 2018-02-14 DIAGNOSIS — D696 Thrombocytopenia, unspecified: Secondary | ICD-10-CM | POA: Diagnosis not present

## 2018-02-14 DIAGNOSIS — R4182 Altered mental status, unspecified: Secondary | ICD-10-CM | POA: Diagnosis present

## 2018-02-14 DIAGNOSIS — K219 Gastro-esophageal reflux disease without esophagitis: Secondary | ICD-10-CM | POA: Diagnosis present

## 2018-02-14 DIAGNOSIS — I481 Persistent atrial fibrillation: Secondary | ICD-10-CM | POA: Insufficient documentation

## 2018-02-14 DIAGNOSIS — G459 Transient cerebral ischemic attack, unspecified: Secondary | ICD-10-CM

## 2018-02-14 HISTORY — DX: Disorder of kidney and ureter, unspecified: N28.9

## 2018-02-14 HISTORY — DX: Thrombocytopenia, unspecified: D69.6

## 2018-02-14 LAB — CBC
HEMATOCRIT: 43.1 % (ref 36.0–46.0)
HEMOGLOBIN: 14.2 g/dL (ref 12.0–15.0)
MCH: 31.5 pg (ref 26.0–34.0)
MCHC: 32.9 g/dL (ref 30.0–36.0)
MCV: 95.6 fL (ref 78.0–100.0)
PLATELETS: 163 10*3/uL (ref 150–400)
RBC: 4.51 MIL/uL (ref 3.87–5.11)
RDW: 13.7 % (ref 11.5–15.5)
WBC: 7 10*3/uL (ref 4.0–10.5)

## 2018-02-14 LAB — COMPREHENSIVE METABOLIC PANEL
ALK PHOS: 48 U/L (ref 38–126)
ALT: 17 U/L (ref 14–54)
AST: 30 U/L (ref 15–41)
Albumin: 3.9 g/dL (ref 3.5–5.0)
Anion gap: 13 (ref 5–15)
BILIRUBIN TOTAL: 0.8 mg/dL (ref 0.3–1.2)
BUN: 23 mg/dL — ABNORMAL HIGH (ref 6–20)
CALCIUM: 9.4 mg/dL (ref 8.9–10.3)
CO2: 27 mmol/L (ref 22–32)
CREATININE: 0.79 mg/dL (ref 0.44–1.00)
Chloride: 94 mmol/L — ABNORMAL LOW (ref 101–111)
GFR calc Af Amer: >60 mL/min (ref >60)
GFR calc non Af Amer: >60 mL/min (ref >60)
Glucose, Bld: 117 mg/dL — ABNORMAL HIGH (ref 65–99)
Potassium: 3.8 mmol/L (ref 3.5–5.1)
Sodium: 134 mmol/L — ABNORMAL LOW (ref 135–145)
TOTAL PROTEIN: 6.5 g/dL (ref 6.5–8.1)

## 2018-02-14 LAB — DIFFERENTIAL
Basophils Absolute: 0 10*3/uL (ref 0.0–0.1)
Basophils Relative: 0 %
EOS PCT: 1 %
Eosinophils Absolute: 0.1 10*3/uL (ref 0.0–0.7)
LYMPHS ABS: 1.6 10*3/uL (ref 0.7–4.0)
LYMPHS PCT: 24 %
MONOS PCT: 8 %
Monocytes Absolute: 0.5 10*3/uL (ref 0.1–1.0)
NEUTROS ABS: 4.7 10*3/uL (ref 1.7–7.7)
Neutrophils Relative %: 67 %

## 2018-02-14 LAB — RAPID URINE DRUG SCREEN, HOSP PERFORMED
Amphetamines: NOT DETECTED
Barbiturates: NOT DETECTED
Benzodiazepines: NOT DETECTED
Cocaine: NOT DETECTED
Opiates: NOT DETECTED
Tetrahydrocannabinol: NOT DETECTED

## 2018-02-14 LAB — URINALYSIS, ROUTINE W REFLEX MICROSCOPIC
BILIRUBIN URINE: NEGATIVE
GLUCOSE, UA: NEGATIVE mg/dL
Hgb urine dipstick: NEGATIVE
KETONES UR: NEGATIVE mg/dL
LEUKOCYTES UA: NEGATIVE
NITRITE: NEGATIVE
PROTEIN: NEGATIVE mg/dL
Specific Gravity, Urine: 1.024 (ref 1.005–1.030)
pH: 8 (ref 5.0–8.0)

## 2018-02-14 LAB — TROPONIN I

## 2018-02-14 LAB — APTT: aPTT: 33 seconds (ref 24–36)

## 2018-02-14 LAB — I-STAT CHEM 8, ED
BUN: 22 mg/dL — ABNORMAL HIGH (ref 6–20)
CREATININE: 0.8 mg/dL (ref 0.44–1.00)
Calcium, Ion: 1.14 mmol/L — ABNORMAL LOW (ref 1.15–1.40)
Chloride: 95 mmol/L — ABNORMAL LOW (ref 101–111)
GLUCOSE: 114 mg/dL — AB (ref 65–99)
HEMATOCRIT: 46 % (ref 36.0–46.0)
HEMOGLOBIN: 15.6 g/dL — AB (ref 12.0–15.0)
Potassium: 3.7 mmol/L (ref 3.5–5.1)
Sodium: 136 mmol/L (ref 135–145)
TCO2: 28 mmol/L (ref 22–32)

## 2018-02-14 LAB — PROTIME-INR
INR: 1.05
PROTHROMBIN TIME: 13.6 s (ref 11.4–15.2)

## 2018-02-14 MED ORDER — ACETAMINOPHEN 325 MG PO TABS: 650.0000 mg | ORAL_TABLET | ORAL | Status: DC | PRN

## 2018-02-14 MED ORDER — APIXABAN 2.5 MG PO TABS
2.5000 mg | ORAL_TABLET | Freq: Two times a day (BID) | ORAL | Status: DC
  Administered 2018-02-14 – 2018-02-15 (×2): 2.5 mg via ORAL
  Filled 2018-02-14 (×2): qty 1

## 2018-02-14 MED ORDER — BOOST / RESOURCE BREEZE PO LIQD CUSTOM: 1.0000 | Freq: Three times a day (TID) | ORAL | Status: DC

## 2018-02-14 MED ORDER — STROKE: EARLY STAGES OF RECOVERY BOOK
Freq: Once | Status: AC
  Administered 2018-02-14: 22:00:00

## 2018-02-14 MED ORDER — BETHANECHOL CHLORIDE 10 MG PO TABS
10.0000 mg | ORAL_TABLET | Freq: Three times a day (TID) | ORAL | Status: DC
  Administered 2018-02-14: 10 mg via ORAL
  Filled 2018-02-14 (×8): qty 1

## 2018-02-14 MED ORDER — METOPROLOL TARTRATE 25 MG PO TABS
25.0000 mg | ORAL_TABLET | Freq: Two times a day (BID) | ORAL | Status: DC
  Administered 2018-02-14 – 2018-02-15 (×2): 25 mg via ORAL
  Filled 2018-02-14 (×2): qty 1

## 2018-02-14 MED ORDER — ARTIFICIAL TEARS OPHTHALMIC OINT
1.0000 "application " | TOPICAL_OINTMENT | Freq: Every day | OPHTHALMIC | Status: DC
  Administered 2018-02-14: 1 via OPHTHALMIC
  Filled 2018-02-14 (×2): qty 3.5

## 2018-02-14 MED ORDER — POLYVINYL ALCOHOL 1.4 % OP SOLN: 1.0000 [drp] | Freq: Three times a day (TID) | OPHTHALMIC | Status: DC | PRN

## 2018-02-14 MED ORDER — IOPAMIDOL (ISOVUE-370) INJECTION 76%
100.0000 mL | Freq: Once | INTRAVENOUS | Status: AC | PRN
  Administered 2018-02-14: 100 mL via INTRAVENOUS

## 2018-02-14 MED ORDER — FAMOTIDINE 20 MG PO TABS
20.0000 mg | ORAL_TABLET | Freq: Two times a day (BID) | ORAL | Status: DC
  Administered 2018-02-14 – 2018-02-15 (×2): 20 mg via ORAL
  Filled 2018-02-14 (×2): qty 1

## 2018-02-14 MED ORDER — ACETAMINOPHEN 650 MG RE SUPP: 650.0000 mg | RECTAL | Status: DC | PRN

## 2018-02-14 MED ORDER — PRO-STAT SUGAR FREE PO LIQD
30.0000 mL | Freq: Two times a day (BID) | ORAL | Status: DC
  Administered 2018-02-14 – 2018-02-15 (×2): 30 mL via ORAL
  Filled 2018-02-14 (×2): qty 30

## 2018-02-14 MED ORDER — ACETAMINOPHEN 160 MG/5ML PO SOLN: 650.0000 mg | ORAL | Status: DC | PRN

## 2018-02-14 MED ORDER — ATORVASTATIN CALCIUM 10 MG PO TABS
10.0000 mg | ORAL_TABLET | Freq: Every day | ORAL | Status: DC
  Administered 2018-02-14: 10 mg via ORAL
  Filled 2018-02-14: qty 1

## 2018-02-14 MED ORDER — ASPIRIN EC 81 MG PO TBEC
81.0000 mg | DELAYED_RELEASE_TABLET | Freq: Every day | ORAL | Status: DC
  Administered 2018-02-14 – 2018-02-15 (×2): 81 mg via ORAL
  Filled 2018-02-14 (×2): qty 1

## 2018-02-14 NOTE — ED Triage Notes (Signed)
Pt brought in by em[facility] [place] [facility] [place] after having episode of expressive aphasia.  Was seen normal at 0830 and then ems was called at 1119.  Pt states her symptoms have now resolved.

## 2018-02-14 NOTE — ED Notes (Signed)
Pt returned from CT °

## 2018-02-14 NOTE — ED Notes (Signed)
Teleneurology called Dr. Criss AlvineGoldston at [phone].

## 2018-02-14 NOTE — ED Notes (Signed)
Pt returned from MRI °

## 2018-02-14 NOTE — H&P (Signed)
History and Physical  Theresa Kennedy NFA:[id number] DOB: May 17, 1921 DOA: 02/14/2018  Referring physician: Dr Criss Alvine, ED physician PCP: Joette Catching, MD  Outpatient Specialists:   Patient Coming From: [facility]  Chief Complaint: Expressive aphasia  HPI: Theresa Kennedy is a 84 y.o. female with a history of atrial fibrillation, hypertension, history of stroke, GERD.  History obtained by patient and nephew.  Patient had no deficits when the nephew was visiting her this morning around 830.  At approximately 11, the staff at [facility] was checking on her and she was having expressive aphasia: The nursing staff had difficulty understanding anything she said.  By the time the patient arrives to the emergency department, her symptoms had completely resolved.  She denies headache, focal weakness, blurred vision, vision loss.  Emergency Department Course: Neuro telemetry was consulted.  CT and CTA of head and neck showed small right posterior MCA ischemic stroke.  Patient had an MRI which showed no acute stroke  Review of Systems:   Pt denies any fevers, chills, nausea, vomiting, diarrhea, constipation, abdominal pain, shortness of breath, dyspnea on exertion, orthopnea, cough, wheezing, palpitations, headache, vision changes, lightheadedness, dizziness, melena, rectal bleeding.  Review of systems are otherwise negative  Past Medical History:  Diagnosis Date  . Atrial fibrillation (HCC)   . GERD (gastroesophageal reflux disease)   . Hypertension   . Renal disorder   . Stroke (HCC)   . Thrombocytopenia (HCC)    Past Surgical History:  Procedure Laterality Date  . ABDOMINAL HYSTERECTOMY    . HIP ARTHROPLASTY Right 07/05/2017   Procedure: ARTHROPLASTY BIPOLAR HIP (HEMIARTHROPLASTY);  Surgeon: Vickki Hearing, MD;  Location: [facility];  Service: Orthopedics;  Laterality: Right;  . JOINT REPLACEMENT     Social History:  reports that she has never smoked. She has never used  smokeless tobacco. She reports that she does not drink alcohol or use drugs. Patient lives at [facility]  Allergies  Allergen Reactions  . Heparin     HIT positive  . Sulfa Antibiotics Other (See Comments)    unknown    Family History  Problem Relation Age of Onset  . Stroke Mother   . Leukemia Brother   . Hypertension Father   . Breast cancer Sister       Prior to Admission medications   Medication Sig Start Date End Date Taking? Authorizing Provider  acetaminophen (TYLENOL) 325 MG tablet Take 2 tablets (650 mg total) by mouth every 6 (six) hours as needed for mild pain (or Fever >/= 101). 07/16/17  Yes Pearson Grippe, MD  Amino Acids-Protein Hydrolys (FEEDING SUPPLEMENT, PRO-STAT SUGAR FREE 64,) LIQD Take 30 mLs by mouth 2 (two) times daily. 07/16/17   Pearson Grippe, MD  amLODipine (NORVASC) 10 MG tablet Take 10 mg by mouth daily.     [provider]  apixaban (ELIQUIS) 2.5 MG TABS tablet Take 1 tablet (2.5 mg total) by mouth 2 (two) times daily. 11/21/15   Ghimire, Werner Lean, MD  atorvastatin (LIPITOR) 10 MG tablet Take 10 mg by mouth daily at 6 PM.  02/06/16   [provider]  bethanechol (URECHOLINE) 10 MG tablet Take 1 tablet (10 mg total) by mouth 3 (three) times daily. 07/16/17   Pearson Grippe, MD  Calcium Carbonate-Vitamin D 600-200 MG-UNIT TABS Take 1 tablet by mouth daily.    [provider]  carbamide peroxide (DEBROX) 6.5 % otic solution Place 5 drops into both ears 2 (two) times daily. Patient taking  differently: Place 5 drops into both ears once a week. USE ONCE A WEEK 02/16/15   Leroy Sea, MD  diphenhydrAMINE (BENADRYL) 25 MG tablet Take 25 mg by mouth every 6 (six) hours as needed for itching.    [provider]  diphenhydrAMINE-zinc acetate (BENADRYL) cream Apply 1 application topically 2 (two) times daily as needed for itching.    [provider]  docusate sodium (COLACE) 100 MG capsule Take 1 capsule (100 mg total) by  mouth daily. 02/16/15   Leroy Sea, MD  feeding supplement (BOOST / RESOURCE BREEZE) LIQD Take 1 Container by mouth 3 (three) times daily between meals. 07/16/17   Pearson Grippe, MD  Grape Seed Extract 100 MG CAPS Take 100 mg by mouth daily.     [provider]  HYDROcodone-acetaminophen (NORCO/VICODIN) 5-325 MG tablet Take 1-2 tablets by mouth every 6 (six) hours as needed for moderate pain. Patient not taking: Reported on 11/06/2017 07/16/17   Pearson Grippe, MD  metoprolol tartrate (LOPRESSOR) 25 MG tablet Take 25 mg by mouth 2 (two) times daily.    [provider]  Multiple Vitamin (MULTIVITAMIN WITH MINERALS) TABS tablet Take 1 tablet by mouth daily.    [provider]  Omega-3 Fatty Acids (FISH OIL) 1000 MG CAPS Take 1 capsule by mouth 2 (two) times daily.     [provider]  phenol (CHLORASEPTIC) 1.4 % LIQD Use as directed 1 spray in the mouth or throat as needed for throat irritation / pain. 07/16/17   Pearson Grippe, MD  polyethylene glycol [facility] Ethelene Hal) packet Take 17 g by mouth daily as needed for mild constipation. 07/16/17   Pearson Grippe, MD  psyllium (METAMUCIL) 58.6 % packet Take 1 packet by mouth daily.    [provider]  ranitidine (ZANTAC) 150 MG capsule Take 150 mg by mouth 2 (two) times daily.  02/08/16 07/03/17  [provider]    Physical Exam: BP (!) 158/65   Pulse (!) 45   Temp (!) 97.5 F (36.4 C) (Oral)   Resp 12   Ht 5\' 4"  (1.626 m)   Wt 51.7 kg (114 lb)   SpO2 96%   BMI 19.57 kg/m   General: Frail-appearing elderly Caucasian female. Awake and alert and oriented x3. No acute cardiopulmonary distress.  HEENT: Normocephalic atraumatic.  Right and left ears normal in appearance.  Pupils equal, round, reactive to light. Extraocular muscles are intact. Sclerae anicteric and noninjected.  Moist mucosal membranes. No mucosal lesions.  Neck: Neck supple without lymphadenopathy. No carotid bruits. No masses palpated.    Cardiovascular: Irregular rate. No murmurs, rubs, gallops auscultated. No JVD.  Respiratory: Good respiratory effort with no wheezes, rales, rhonchi. Lungs clear to auscultation bilaterally.  No accessory muscle use. Abdomen: Soft, nontender, nondistended. Active bowel sounds. No masses or hepatosplenomegaly  Skin: No rashes, lesions, or ulcerations.  Dry, warm to touch. 2+ dorsalis pedis and radial pulses. Musculoskeletal: No calf or leg pain. All major joints not erythematous nontender.  No upper or lower joint deformation.  Good ROM.  No contractures  Psychiatric: Intact judgment and insight. Pleasant and cooperative. Neurologic: No focal neurological deficits. Strength is 5/5 and symmetric in upper and lower extremities.  Coordination intact.  Cranial nerves II through XII are grossly intact.           Labs on Admission: I have personally reviewed following labs and imaging studies  CBC: Recent Labs  Lab 02/14/18 1225 02/14/18 1229  WBC 7.0  --  NEUTROABS 4.7  --   HGB 14.2 15.6*  HCT 43.1 46.0  MCV 95.6  --   PLT 163  --    Basic Metabolic Panel: Recent Labs  Lab 02/14/18 1225 02/14/18 1229  NA 134* 136  K 3.8 3.7  CL 94* 95*  CO2 27  --   GLUCOSE 117* 114*  BUN 23* 22*  CREATININE 0.79 0.80  CALCIUM 9.4  --    GFR: Estimated Creatinine Clearance: 35.1 mL/min (by C-G formula based on SCr of 0.8 mg/dL). Liver Function Tests: Recent Labs  Lab 02/14/18 1225  AST 30  ALT 17  ALKPHOS 48  BILITOT 0.8  PROT 6.5  ALBUMIN 3.9   No results for input(s): LIPASE, AMYLASE in the last 168 hours. No results for input(s): AMMONIA in the last 168 hours. Coagulation Profile: Recent Labs  Lab 02/14/18 1225  INR 1.05   Cardiac Enzymes: Recent Labs  Lab 02/14/18 1225  TROPONINI <0.03   BNP (last 3 results) No results for input(s): PROBNP in the last 8760 hours. HbA1C: No results for input(s): HGBA1C in the last 72 hours. CBG: No results for input(s): GLUCAP  in the last 168 hours. Lipid Profile: No results for input(s): CHOL, HDL, LDLCALC, TRIG, CHOLHDL, LDLDIRECT in the last 72 hours. Thyroid Function Tests: No results for input(s): TSH, T4TOTAL, FREET4, T3FREE, THYROIDAB in the last 72 hours. Anemia Panel: No results for input(s): VITAMINB12, FOLATE, FERRITIN, TIBC, IRON, RETICCTPCT in the last 72 hours. Urine analysis:    Component Value Date/Time   COLORURINE YELLOW 07/13/2017 0530   APPEARANCEUR HAZY (A) 07/13/2017 0530   LABSPEC 1.005 07/13/2017 0530   PHURINE 6.0 07/13/2017 0530   GLUCOSEU NEGATIVE 07/13/2017 0530   HGBUR LARGE (A) 07/13/2017 0530   BILIRUBINUR NEGATIVE 07/13/2017 0530   KETONESUR NEGATIVE 07/13/2017 0530   PROTEINUR NEGATIVE 07/13/2017 0530   UROBILINOGEN 0.2 11/21/2014 1341   NITRITE NEGATIVE 07/13/2017 0530   LEUKOCYTESUR LARGE (A) 07/13/2017 0530   Sepsis Labs: @LABRCNTIP (procalcitonin:4,lacticidven:4) )No results found for this or any previous visit (from the past 240 hour(s)).   Radiological Exams on Admission: Ct Angio Head W Or Wo Contrast  Result Date: 02/14/2018 CLINICAL DATA:  TIA EXAM: CT ANGIOGRAPHY HEAD AND NECK CT PERFUSION BRAIN TECHNIQUE: Multidetector CT imaging of the head and neck was performed using the standard protocol during bolus administration of intravenous contrast. Multiplanar CT image reconstructions and MIPs were obtained to evaluate the vascular anatomy. Carotid stenosis measurements (when applicable) are obtained utilizing NASCET criteria, using the distal internal carotid diameter as the denominator. Multiphase CT imaging of the brain was performed following IV bolus contrast injection. Subsequent parametric perfusion maps were calculated using RAPID software. CONTRAST:  100mL ISOVUE-370 IOPAMIDOL (ISOVUE-370) INJECTION 76% COMPARISON:  CT head 02/14/2018 FINDINGS: CTA NECK FINDINGS Aortic arch: Left aortic arch. Aberrant right subclavian artery with retroesophageal course. Left  vertebral artery origin from the aortic arch. Mild atherosclerotic disease in the aortic arch. Right carotid system: Stenosis at the origin of the right common carotid artery at the arch. This is best seen on the sagittal projection and measures 80% diameter stenosis. Atherosclerotic calcification at the right carotid bifurcation. Less than 25% diameter stenosis at the origin of the right internal carotid artery. Moderate stenosis origin of right external carotid artery. Left carotid system: Left common carotid artery widely patent with mild atherosclerotic disease. Atherosclerotic calcification left carotid bifurcation. Less than 25% diameter stenosis left internal carotid artery Vertebral arteries: Right vertebral artery patent to the  basilar without stenosis Left vertebral artery origin from the arch. Moderate stenosis due to calcified plaque proximal left vertebral artery. Remainder left vertebral artery is patent to the basilar without stenosis. Skeleton: Moderate to advanced cervical spondylosis with spinal and foraminal stenosis. No acute abnormality Other neck: Negative for mass or adenopathy. Multiple small thyroid nodules bilaterally. Upper chest: Extensive apical scarring bilaterally with COPD. Review of the MIP images confirms the above findings CTA HEAD FINDINGS Anterior circulation: Mild atherosclerotic disease in the cavernous carotid bilaterally without stenosis. Anterior and middle cerebral arteries patent bilaterally without stenosis or occlusion. Posterior circulation: Both vertebral arteries widely patent the basilar. PICA patent bilaterally. AICA, superior cerebellar, and posterior cerebral arteries patent bilaterally. Basilar widely patent. Posterior communicating artery patent bilaterally. Venous sinuses: Patent Anatomic variants: None Delayed phase: Normal enhancement on delayed imaging. Review of the MIP images confirms the above findings CT Brain Perfusion Findings: CBF (<30%) Volume: 6mL  Perfusion (Tmax>6.0s) volume: 30mL Mismatch Volume: 24mL Infarction Location:Right posterior MCA territory involving the deep white matter and right parietal lobe. IMPRESSION: 1. Right posterior MCA ischemia with small area of core infarct in the deep white matter on the right and ischemia in the right parietal white matter and cortex. 2. No emergent large vessel occlusion. No significant intracranial stenosis 3. Mild atherosclerotic disease the prior carotid bifurcation bilaterally. Moderate stenosis origin of left vertebral artery which originates from the aortic arch. 80% stenosis at the origin of the right common carotid artery which arises from the aortic arch. 4. Aberrant right subclavian artery. 5. These results were called by telephone at the time of interpretation on 02/14/2018 at 3:16 pm to Dr. Clarene Duke , who verbally acknowledged these results. Electronically Signed   By: Marlan Palau M.D.   On: 02/14/2018 15:17   Ct Head Wo Contrast  Result Date: 02/14/2018 CLINICAL DATA:  Subacute neuro deficits.  Expressive aphasia EXAM: CT HEAD WITHOUT CONTRAST TECHNIQUE: Contiguous axial images were obtained from the base of the skull through the vertex without intravenous contrast. COMPARISON:  MRI head 09/30/2017 FINDINGS: Brain: Mild to moderate atrophy. Chronic microvascular ischemic change in the white matter. Chronic infarct right frontal lobe. Negative for acute infarct. Negative for hemorrhage or mass. No midline shift. Vascular: Negative for hyperdense vessel Skull: Negative Sinuses/Orbits: Negative.  Bilateral cataract removal Other: None IMPRESSION: No acute abnormality. Atrophy and chronic ischemic changes stable from the prior study. Electronically Signed   By: Marlan Palau M.D.   On: 02/14/2018 13:00   Ct Angio Neck W Or Wo Contrast  Result Date: 02/14/2018 CLINICAL DATA:  TIA EXAM: CT ANGIOGRAPHY HEAD AND NECK CT PERFUSION BRAIN TECHNIQUE: Multidetector CT imaging of the head and neck was  performed using the standard protocol during bolus administration of intravenous contrast. Multiplanar CT image reconstructions and MIPs were obtained to evaluate the vascular anatomy. Carotid stenosis measurements (when applicable) are obtained utilizing NASCET criteria, using the distal internal carotid diameter as the denominator. Multiphase CT imaging of the brain was performed following IV bolus contrast injection. Subsequent parametric perfusion maps were calculated using RAPID software. CONTRAST:  ISOVUE-370 IOPAMIDOL (ISOVUE-370) INJECTION 76% COMPARISON:  CT head 02/14/2018 FINDINGS: CTA NECK FINDINGS Aortic arch: Left aortic arch. Aberrant right subclavian artery with retroesophageal course. Left vertebral artery origin from the aortic arch. Mild atherosclerotic disease in the aortic arch. Right carotid system: Stenosis at the origin of the right common carotid artery at the arch. This is best seen on the sagittal projection and measures 80% diameter  stenosis. Atherosclerotic calcification at the right carotid bifurcation. Less than 25% diameter stenosis at the origin of the right internal carotid artery. Moderate stenosis origin of right external carotid artery. Left carotid system: Left common carotid artery widely patent with mild atherosclerotic disease. Atherosclerotic calcification left carotid bifurcation. Less than 25% diameter stenosis left internal carotid artery Vertebral arteries: Right vertebral artery patent to the basilar without stenosis Left vertebral artery origin from the arch. Moderate stenosis due to calcified plaque proximal left vertebral artery. Remainder left vertebral artery is patent to the basilar without stenosis. Skeleton: Moderate to advanced cervical spondylosis with spinal and foraminal stenosis. No acute abnormality Other neck: Negative for mass or adenopathy. Multiple small thyroid nodules bilaterally. Upper chest: Extensive apical scarring bilaterally with COPD.  Review of the MIP images confirms the above findings CTA HEAD FINDINGS Anterior circulation: Mild atherosclerotic disease in the cavernous carotid bilaterally without stenosis. Anterior and middle cerebral arteries patent bilaterally without stenosis or occlusion. Posterior circulation: Both vertebral arteries widely patent the basilar. PICA patent bilaterally. AICA, superior cerebellar, and posterior cerebral arteries patent bilaterally. Basilar widely patent. Posterior communicating artery patent bilaterally. Venous sinuses: Patent Anatomic variants: None Delayed phase: Normal enhancement on delayed imaging. Review of the MIP images confirms the above findings CT Brain Perfusion Findings: CBF (<30%) Volume: 6mL Perfusion (Tmax>6.0s) volume: 30mL Mismatch Volume: 24mL Infarction Location:Right posterior MCA territory involving the deep white matter and right parietal lobe. IMPRESSION: 1. Right posterior MCA ischemia with small area of core infarct in the deep white matter on the right and ischemia in the right parietal white matter and cortex. 2. No emergent large vessel occlusion. No significant intracranial stenosis 3. Mild atherosclerotic disease the prior carotid bifurcation bilaterally. Moderate stenosis origin of left vertebral artery which originates from the aortic arch. 80% stenosis at the origin of the right common carotid artery which arises from the aortic arch. 4. Aberrant right subclavian artery. 5. These results were called by telephone at the time of interpretation on 02/14/2018 at 3:16 pm to Dr. Clarene Duke , who verbally acknowledged these results. Electronically Signed   By: Marlan Palau M.D.   On: 02/14/2018 15:17   Ct Cerebral Perfusion W Contrast  Result Date: 02/14/2018 CLINICAL DATA:  TIA EXAM: CT ANGIOGRAPHY HEAD AND NECK CT PERFUSION BRAIN TECHNIQUE: Multidetector CT imaging of the head and neck was performed using the standard protocol during bolus administration of intravenous contrast.  Multiplanar CT image reconstructions and MIPs were obtained to evaluate the vascular anatomy. Carotid stenosis measurements (when applicable) are obtained utilizing NASCET criteria, using the distal internal carotid diameter as the denominator. Multiphase CT imaging of the brain was performed following IV bolus contrast injection. Subsequent parametric perfusion maps were calculated using RAPID software. CONTRAST:  ISOVUE-370 IOPAMIDOL (ISOVUE-370) INJECTION 76% COMPARISON:  CT head 02/14/2018 FINDINGS: CTA NECK FINDINGS Aortic arch: Left aortic arch. Aberrant right subclavian artery with retroesophageal course. Left vertebral artery origin from the aortic arch. Mild atherosclerotic disease in the aortic arch. Right carotid system: Stenosis at the origin of the right common carotid artery at the arch. This is best seen on the sagittal projection and measures 80% diameter stenosis. Atherosclerotic calcification at the right carotid bifurcation. Less than 25% diameter stenosis at the origin of the right internal carotid artery. Moderate stenosis origin of right external carotid artery. Left carotid system: Left common carotid artery widely patent with mild atherosclerotic disease. Atherosclerotic calcification left carotid bifurcation. Less than 25% diameter stenosis left internal carotid artery Vertebral  arteries: Right vertebral artery patent to the basilar without stenosis Left vertebral artery origin from the arch. Moderate stenosis due to calcified plaque proximal left vertebral artery. Remainder left vertebral artery is patent to the basilar without stenosis. Skeleton: Moderate to advanced cervical spondylosis with spinal and foraminal stenosis. No acute abnormality Other neck: Negative for mass or adenopathy. Multiple small thyroid nodules bilaterally. Upper chest: Extensive apical scarring bilaterally with COPD. Review of the MIP images confirms the above findings CTA HEAD FINDINGS Anterior circulation:  Mild atherosclerotic disease in the cavernous carotid bilaterally without stenosis. Anterior and middle cerebral arteries patent bilaterally without stenosis or occlusion. Posterior circulation: Both vertebral arteries widely patent the basilar. PICA patent bilaterally. AICA, superior cerebellar, and posterior cerebral arteries patent bilaterally. Basilar widely patent. Posterior communicating artery patent bilaterally. Venous sinuses: Patent Anatomic variants: None Delayed phase: Normal enhancement on delayed imaging. Review of the MIP images confirms the above findings CT Brain Perfusion Findings: CBF (<30%) Volume: 6mL Perfusion (Tmax>6.0s) volume: 30mL Mismatch Volume: 24mL Infarction Location:Right posterior MCA territory involving the deep white matter and right parietal lobe. IMPRESSION: 1. Right posterior MCA ischemia with small area of core infarct in the deep white matter on the right and ischemia in the right parietal white matter and cortex. 2. No emergent large vessel occlusion. No significant intracranial stenosis 3. Mild atherosclerotic disease the prior carotid bifurcation bilaterally. Moderate stenosis origin of left vertebral artery which originates from the aortic arch. 80% stenosis at the origin of the right common carotid artery which arises from the aortic arch. 4. Aberrant right subclavian artery. 5. These results were called by telephone at the time of interpretation on 02/14/2018 at 3:16 pm to Dr. Clarene Duke , who verbally acknowledged these results. Electronically Signed   By: Marlan Palau M.D.   On: 02/14/2018 15:17    EKG: Independently reviewed.  Atrial fibrillation that is rate controlled.  Left anterior fascicular block.  LVH.  No acute ST elevation or depression.  Assessment/Plan: Principal Problem:   Acute right MCA stroke [facility]) Active Problems:   Atrial fibrillation, persistent (HCC)   GERD (gastroesophageal reflux disease)   Essential hypertension    This patient was  discussed with the ED physician, including pertinent vitals, physical exam findings, labs, and imaging.  We also discussed care given by the ED provider.  1. Acute right MCA stroke vs TIA Admit on telemetry Permissive hypertension to 180/110  MRI head done Carotid Dopplers  Echocardiogram tomorrow Hemoglobin A1c, lipid panel in the morning PT/OT/speech therapy consult EDP consulted Dr Judithann Sheen - continue eliquis. Add ASA 81mg  x1 week. 2. Atrial fibrillation 1. Continue rate control with metoprolol 2. If MRI is negative for bleeding around ischemic stroke, will continue Eliquis as her stroke is small 3. GERD 1. Continue Zantac 4. Hypertension 1. Continue metoprolol for rate control of atrial fibrillation 2. Hold amlodipine  DVT prophylaxis: On Eliquis Consultants: Neuro Code Status: DNR Family Communication: Nephew  Disposition Plan: return to [facility] following admission   Noralee Chars [facility] Pager [phone]  If 7PM-7AM, please contact night-coverage [email or web address] Password [email or web address]

## 2018-02-14 NOTE — ED Provider Notes (Signed)
[facility] EMERGENCY DEPARTMENT Provider Note   CSN: [id number] Arrival date & time: 02/14/18  1211     History   Chief Complaint Chief Complaint  Patient presents with  . Altered Mental Status    HPI Theresa Kennedy is a 84 y.o. female.  HPI  84 year old female with a history of atrial fibrillation on Eliquis presents with transient trouble speaking.  Patient tells me that she was feeling very thirsty this morning and had trouble talking and was asking for water.  Staff told EMS that the patient was last seen normal by family around 830 this morning.  Sometime around 11 this morning they were checking on her and she was having expressive aphasia.  They could hardly understand anything she said.  This was resolved by the time she was here.  She otherwise denies complaints including headache or focal weakness.  Past Medical History:  Diagnosis Date  . Atrial fibrillation (HCC)   . GERD (gastroesophageal reflux disease)   . Hypertension   . Renal disorder   . Stroke (HCC)   . Thrombocytopenia [facility])     Patient Active Problem List   Diagnosis Date Noted  . Acute right MCA stroke (HCC) 02/14/2018  . TIA (transient ischemic attack) 02/14/2018  . Adynamic ileus (HCC) 07/07/2017  . Abdominal distension   . Other fracture of right femur, initial encounter for closed fracture (HCC) 07/03/2017  . Essential hypertension 07/03/2017  . Closed subcapital fracture of right femur (HCC) Bipolar 07/05/17   . Stroke (cerebrum) (HCC) 11/20/2015  . Abdominal pain 02/12/2015  . HLD (hyperlipidemia) 02/12/2015  . GERD (gastroesophageal reflux disease) 02/12/2015  . Hyponatremia 02/12/2015  . right frontal infarct secondary to afib 04/21/2013  . Atrial fibrillation, persistent (HCC) 04/21/2013  . Hyperglycemia 04/20/2013    Past Surgical History:  Procedure Laterality Date  . ABDOMINAL HYSTERECTOMY    . HIP ARTHROPLASTY Right 07/05/2017   Procedure: ARTHROPLASTY BIPOLAR HIP  (HEMIARTHROPLASTY);  Surgeon: Vickki Hearing, MD;  Location: [facility];  Service: Orthopedics;  Laterality: Right;  . JOINT REPLACEMENT       OB History   None      Home Medications    Prior to Admission medications   Medication Sig Start Date End Date Taking? Authorizing Provider  acetaminophen (TYLENOL) 325 MG tablet Take 2 tablets (650 mg total) by mouth every 6 (six) hours as needed for mild pain (or Fever >/= 101). 07/16/17  Yes Pearson Grippe, MD  amLODipine (NORVASC) 10 MG tablet Take 10 mg by mouth daily.    Yes [provider]  apixaban (ELIQUIS) 2.5 MG TABS tablet Take 1 tablet (2.5 mg total) by mouth 2 (two) times daily. 11/21/15  Yes Ghimire, Werner Lean, MD  atorvastatin (LIPITOR) 10 MG tablet Take 10 mg by mouth daily at 6 PM.  02/06/16  Yes [provider]  Calcium Carbonate-Vitamin D 600-200 MG-UNIT TABS Take 1 tablet by mouth daily.   Yes [provider]  cholecalciferol (VITAMIN D) 1000 units tablet Take 1,000 Units by mouth daily.   Yes [provider]  diphenhydrAMINE (BENADRYL) 25 MG tablet Take 25 mg by mouth every 6 (six) hours as needed for itching.   Yes [provider]  diphenhydrAMINE-zinc acetate (BENADRYL) cream Apply 1 application topically 2 (two) times daily as needed for itching.   Yes [provider]  docusate sodium (COLACE) 100 MG capsule Take 1 capsule (100 mg total) by mouth daily. 02/16/15  Yes Leroy Sea, MD  metoprolol tartrate (LOPRESSOR) 25 MG tablet Take 25 mg by mouth 2 (two) times daily.   Yes [provider]  Multiple Vitamin (MULTIVITAMIN WITH MINERALS) TABS tablet Take 1 tablet by mouth daily.   Yes [provider]  Omega-3 Fatty Acids (FISH OIL) 1000 MG CAPS Take 1 capsule by mouth 2 (two) times daily.    Yes [provider]  Polyethyl Glycol-Propyl Glycol (SYSTANE) 0.4-0.3 % SOLN Apply 1 drop to eye every 8 (eight) hours as needed (dry eyes).   Yes [provider]  polyethylene glycol (MIRALAX / GLYCOLAX) packet Take 17 g by mouth daily as needed for mild constipation. 07/16/17  Yes Pearson GrippeKim, James, MD  psyllium (METAMUCIL) 58.6 % packet Take 1 packet by mouth daily.   Yes [provider]  ranitidine (ZANTAC) 150 MG capsule Take 150 mg by mouth 2 (two) times daily.  02/08/16 02/14/18 Yes [provider]  sodium chloride (MURO 128) 5 % ophthalmic ointment Place 1 application into both eyes at bedtime.   Yes [provider]  HYDROcodone-acetaminophen (NORCO/VICODIN) 5-325 MG tablet Take 1-2 tablets by mouth every 6 (six) hours as needed for moderate pain. Patient not taking: Reported on 11/06/2017 07/16/17   Pearson GrippeKim, James, MD    Family History Family History  Problem Relation Age of Onset  . Stroke Mother   . Leukemia Brother   . Hypertension Father   . Breast cancer Sister     Social History Social History   Tobacco Use  . Smoking status: Never Smoker  . Smokeless tobacco: Never Used  Substance Use Topics  . Alcohol use: No  . Drug use: No     Allergies   Heparin and Sulfa antibiotics   Review of Systems Review of Systems  Cardiovascular: Negative for chest pain.  Neurological: Positive for speech difficulty. Negative for weakness and headaches.     Physical Exam Updated Vital Signs BP (!) 150/61   Pulse (!) 52   Temp (!) 97.5 F (36.4 C) (Oral)   Resp 17   Ht 5\' 4"  (1.626 m)   Wt 51.7 kg (114 lb)   SpO2 96%   BMI 19.57 kg/m   Physical Exam  Constitutional: She appears well-developed and well-nourished. No distress.  HENT:  Head: Normocephalic and atraumatic.  Right Ear: External ear normal.  Left Ear: External ear normal.  Nose: Nose normal.  Eyes: Pupils are equal, round, and reactive to light. EOM are normal. Right eye exhibits no discharge. Left eye exhibits no discharge.  Cardiovascular: Normal rate, regular rhythm and normal heart sounds.  Pulmonary/Chest: Effort normal and  breath sounds normal.  Abdominal: Soft. There is no tenderness.  Neurological: She is alert. She is disoriented.  Awake, alert, oriented to self and place (knows [place] in a [place] in [place] disoriented to time. CN 3-12 grossly intact. 5/5 strength in all 4 extremities. Grossly normal sensation. Normal finger to nose.   Skin: Skin is warm and dry. She is not diaphoretic.  Nursing note and vitals reviewed.    ED Treatments / Results  Labs (all labs ordered are listed, but only abnormal results are displayed) Labs Reviewed  COMPREHENSIVE METABOLIC PANEL - Abnormal; Notable for the following components:      Result Value   Sodium 134 (*)    Chloride 94 (*)    Glucose, Bld 117 (*)    BUN 23 (*)    All other components within normal limits  I-STAT CHEM 8, ED - Abnormal; Notable  for the following components:   Chloride 95 (*)    BUN 22 (*)    Glucose, Bld 114 (*)    Calcium, Ion 1.14 (*)    Hemoglobin 15.6 (*)    All other components within normal limits  PROTIME-INR  APTT  CBC  DIFFERENTIAL  TROPONIN I  URINALYSIS, ROUTINE W REFLEX MICROSCOPIC  RAPID URINE DRUG SCREEN, HOSP PERFORMED  CBG MONITORING, ED    EKG EKG Interpretation  Date/Time:  Friday February 14 2018 12:23:16 EDT Ventricular Rate:  68 PR Interval:    QRS Duration: 99 QT Interval:  459 QTC Calculation: 427 R Axis:   -76 Text Interpretation:  Atrial fibrillation Left anterior fascicular block LVH with secondary repolarization abnormality Anterior Q waves, possibly due to LVH afib instead of aflutter when compared to 2018 Confirmed by Pricilla Loveless [phone]) on 02/14/2018 12:41:42 PM   Radiology Ct Angio Head W Or Wo Contrast  Result Date: 02/14/2018 CLINICAL DATA:  TIA EXAM: CT ANGIOGRAPHY HEAD AND NECK CT PERFUSION BRAIN TECHNIQUE: Multidetector CT imaging of the head and neck was performed using the standard protocol during bolus administration of intravenous contrast. Multiplanar CT image  reconstructions and MIPs were obtained to evaluate the vascular anatomy. Carotid stenosis measurements (when applicable) are obtained utilizing NASCET criteria, using the distal internal carotid diameter as the denominator. Multiphase CT imaging of the brain was performed following IV bolus contrast injection. Subsequent parametric perfusion maps were calculated using RAPID software. CONTRAST:  ISOVUE-370 IOPAMIDOL (ISOVUE-370) INJECTION 76% COMPARISON:  CT head 02/14/2018 FINDINGS: CTA NECK FINDINGS Aortic arch: Left aortic arch. Aberrant right subclavian artery with retroesophageal course. Left vertebral artery origin from the aortic arch. Mild atherosclerotic disease in the aortic arch. Right carotid system: Stenosis at the origin of the right common carotid artery at the arch. This is best seen on the sagittal projection and measures 80% diameter stenosis. Atherosclerotic calcification at the right carotid bifurcation. Less than 25% diameter stenosis at the origin of the right internal carotid artery. Moderate stenosis origin of right external carotid artery. Left carotid system: Left common carotid artery widely patent with mild atherosclerotic disease. Atherosclerotic calcification left carotid bifurcation. Less than 25% diameter stenosis left internal carotid artery Vertebral arteries: Right vertebral artery patent to the basilar without stenosis Left vertebral artery origin from the arch. Moderate stenosis due to calcified plaque proximal left vertebral artery. Remainder left vertebral artery is patent to the basilar without stenosis. Skeleton: Moderate to advanced cervical spondylosis with spinal and foraminal stenosis. No acute abnormality Other neck: Negative for mass or adenopathy. Multiple small thyroid nodules bilaterally. Upper chest: Extensive apical scarring bilaterally with COPD. Review of the MIP images confirms the above findings CTA HEAD FINDINGS Anterior circulation: Mild atherosclerotic  disease in the cavernous carotid bilaterally without stenosis. Anterior and middle cerebral arteries patent bilaterally without stenosis or occlusion. Posterior circulation: Both vertebral arteries widely patent the basilar. PICA patent bilaterally. AICA, superior cerebellar, and posterior cerebral arteries patent bilaterally. Basilar widely patent. Posterior communicating artery patent bilaterally. Venous sinuses: Patent Anatomic variants: None Delayed phase: Normal enhancement on delayed imaging. Review of the MIP images confirms the above findings CT Brain Perfusion Findings: CBF (<30%) Volume: 6mL Perfusion (Tmax>6.0s) volume: 30mL Mismatch Volume: 24mL Infarction Location:Right posterior MCA territory involving the deep white matter and right parietal lobe. IMPRESSION: 1. Right posterior MCA ischemia with small area of core infarct in the deep white matter on the right and ischemia in the right parietal white matter and cortex.  2. No emergent large vessel occlusion. No significant intracranial stenosis 3. Mild atherosclerotic disease the prior carotid bifurcation bilaterally. Moderate stenosis origin of left vertebral artery which originates from the aortic arch. 80% stenosis at the origin of the right common carotid artery which arises from the aortic arch. 4. Aberrant right subclavian artery. 5. These results were called by telephone at the time of interpretation on 02/14/2018 at 3:16 pm to Dr. Clarene Duke , who verbally acknowledged these results. Electronically Signed   By: Marlan Palau M.D.   On: 02/14/2018 15:17   Ct Head Wo Contrast  Result Date: 02/14/2018 CLINICAL DATA:  Subacute neuro deficits.  Expressive aphasia EXAM: CT HEAD WITHOUT CONTRAST TECHNIQUE: Contiguous axial images were obtained from the base of the skull through the vertex without intravenous contrast. COMPARISON:  MRI head 09/30/2017 FINDINGS: Brain: Mild to moderate atrophy. Chronic microvascular ischemic change in the white matter.  Chronic infarct right frontal lobe. Negative for acute infarct. Negative for hemorrhage or mass. No midline shift. Vascular: Negative for hyperdense vessel Skull: Negative Sinuses/Orbits: Negative.  Bilateral cataract removal Other: None IMPRESSION: No acute abnormality. Atrophy and chronic ischemic changes stable from the prior study. Electronically Signed   By: Marlan Palau M.D.   On: 02/14/2018 13:00   Ct Angio Neck W Or Wo Contrast  Result Date: 02/14/2018 CLINICAL DATA:  TIA EXAM: CT ANGIOGRAPHY HEAD AND NECK CT PERFUSION BRAIN TECHNIQUE: Multidetector CT imaging of the head and neck was performed using the standard protocol during bolus administration of intravenous contrast. Multiplanar CT image reconstructions and MIPs were obtained to evaluate the vascular anatomy. Carotid stenosis measurements (when applicable) are obtained utilizing NASCET criteria, using the distal internal carotid diameter as the denominator. Multiphase CT imaging of the brain was performed following IV bolus contrast injection. Subsequent parametric perfusion maps were calculated using RAPID software. CONTRAST:  ISOVUE-370 IOPAMIDOL (ISOVUE-370) INJECTION 76% COMPARISON:  CT head 02/14/2018 FINDINGS: CTA NECK FINDINGS Aortic arch: Left aortic arch. Aberrant right subclavian artery with retroesophageal course. Left vertebral artery origin from the aortic arch. Mild atherosclerotic disease in the aortic arch. Right carotid system: Stenosis at the origin of the right common carotid artery at the arch. This is best seen on the sagittal projection and measures 80% diameter stenosis. Atherosclerotic calcification at the right carotid bifurcation. Less than 25% diameter stenosis at the origin of the right internal carotid artery. Moderate stenosis origin of right external carotid artery. Left carotid system: Left common carotid artery widely patent with mild atherosclerotic disease. Atherosclerotic calcification left carotid  bifurcation. Less than 25% diameter stenosis left internal carotid artery Vertebral arteries: Right vertebral artery patent to the basilar without stenosis Left vertebral artery origin from the arch. Moderate stenosis due to calcified plaque proximal left vertebral artery. Remainder left vertebral artery is patent to the basilar without stenosis. Skeleton: Moderate to advanced cervical spondylosis with spinal and foraminal stenosis. No acute abnormality Other neck: Negative for mass or adenopathy. Multiple small thyroid nodules bilaterally. Upper chest: Extensive apical scarring bilaterally with COPD. Review of the MIP images confirms the above findings CTA HEAD FINDINGS Anterior circulation: Mild atherosclerotic disease in the cavernous carotid bilaterally without stenosis. Anterior and middle cerebral arteries patent bilaterally without stenosis or occlusion. Posterior circulation: Both vertebral arteries widely patent the basilar. PICA patent bilaterally. AICA, superior cerebellar, and posterior cerebral arteries patent bilaterally. Basilar widely patent. Posterior communicating artery patent bilaterally. Venous sinuses: Patent Anatomic variants: None Delayed phase: Normal enhancement on delayed imaging. Review of the  MIP images confirms the above findings CT Brain Perfusion Findings: CBF (<30%) Volume: 6mL Perfusion (Tmax>6.0s) volume: 30mL Mismatch Volume: 24mL Infarction Location:Right posterior MCA territory involving the deep white matter and right parietal lobe. IMPRESSION: 1. Right posterior MCA ischemia with small area of core infarct in the deep white matter on the right and ischemia in the right parietal white matter and cortex. 2. No emergent large vessel occlusion. No significant intracranial stenosis 3. Mild atherosclerotic disease the prior carotid bifurcation bilaterally. Moderate stenosis origin of left vertebral artery which originates from the aortic arch. 80% stenosis at the origin of the right  common carotid artery which arises from the aortic arch. 4. Aberrant right subclavian artery. 5. These results were called by telephone at the time of interpretation on 02/14/2018 at 3:16 pm to Dr. Clarene Duke , who verbally acknowledged these results. Electronically Signed   By: Marlan Palau M.D.   On: 02/14/2018 15:17   Mr Brain Wo Contrast  Result Date: 02/14/2018 CLINICAL DATA:  Dizziness.  Episode of transient aphasia today EXAM: MRI HEAD WITHOUT CONTRAST TECHNIQUE: Multiplanar, multiecho pulse sequences of the brain and surrounding structures were obtained without intravenous contrast. COMPARISON:  Head CT and CTA from earlier today FINDINGS: Brain: Remote right MCA branch infarct affecting the high right frontal lobe, cortically based. There is moderate atrophy and chronic periventricular small vessel ischemic gliosis. No hemorrhage, hydrocephalus, or masslike finding. No generalized chronic blood products. Vascular: Major flow voids are preserved.  There was preceding CTA. Skull and upper cervical spine: No evidence of marrow lesion Sinuses/Orbits: Bilateral cataract resection.  No acute finding. IMPRESSION: 1. No acute finding.  Negative for acute infarct. 2. Remote right MCA branch infarct affecting the frontal lobe. 3. Moderate atrophy and chronic small vessel ischemia. Electronically Signed   By: Marnee Spring M.D.   On: 02/14/2018 16:29   Ct Cerebral Perfusion W Contrast  Result Date: 02/14/2018 CLINICAL DATA:  TIA EXAM: CT ANGIOGRAPHY HEAD AND NECK CT PERFUSION BRAIN TECHNIQUE: Multidetector CT imaging of the head and neck was performed using the standard protocol during bolus administration of intravenous contrast. Multiplanar CT image reconstructions and MIPs were obtained to evaluate the vascular anatomy. Carotid stenosis measurements (when applicable) are obtained utilizing NASCET criteria, using the distal internal carotid diameter as the denominator. Multiphase CT imaging of the brain was  performed following IV bolus contrast injection. Subsequent parametric perfusion maps were calculated using RAPID software. CONTRAST:  ISOVUE-370 IOPAMIDOL (ISOVUE-370) INJECTION 76% COMPARISON:  CT head 02/14/2018 FINDINGS: CTA NECK FINDINGS Aortic arch: Left aortic arch. Aberrant right subclavian artery with retroesophageal course. Left vertebral artery origin from the aortic arch. Mild atherosclerotic disease in the aortic arch. Right carotid system: Stenosis at the origin of the right common carotid artery at the arch. This is best seen on the sagittal projection and measures 80% diameter stenosis. Atherosclerotic calcification at the right carotid bifurcation. Less than 25% diameter stenosis at the origin of the right internal carotid artery. Moderate stenosis origin of right external carotid artery. Left carotid system: Left common carotid artery widely patent with mild atherosclerotic disease. Atherosclerotic calcification left carotid bifurcation. Less than 25% diameter stenosis left internal carotid artery Vertebral arteries: Right vertebral artery patent to the basilar without stenosis Left vertebral artery origin from the arch. Moderate stenosis due to calcified plaque proximal left vertebral artery. Remainder left vertebral artery is patent to the basilar without stenosis. Skeleton: Moderate to advanced cervical spondylosis with spinal and foraminal stenosis. No acute abnormality  Other neck: Negative for mass or adenopathy. Multiple small thyroid nodules bilaterally. Upper chest: Extensive apical scarring bilaterally with COPD. Review of the MIP images confirms the above findings CTA HEAD FINDINGS Anterior circulation: Mild atherosclerotic disease in the cavernous carotid bilaterally without stenosis. Anterior and middle cerebral arteries patent bilaterally without stenosis or occlusion. Posterior circulation: Both vertebral arteries widely patent the basilar. PICA patent bilaterally. AICA,  superior cerebellar, and posterior cerebral arteries patent bilaterally. Basilar widely patent. Posterior communicating artery patent bilaterally. Venous sinuses: Patent Anatomic variants: None Delayed phase: Normal enhancement on delayed imaging. Review of the MIP images confirms the above findings CT Brain Perfusion Findings: CBF (<30%) Volume: 6mL Perfusion (Tmax>6.0s) volume: 30mL Mismatch Volume: 24mL Infarction Location:Right posterior MCA territory involving the deep white matter and right parietal lobe. IMPRESSION: 1. Right posterior MCA ischemia with small area of core infarct in the deep white matter on the right and ischemia in the right parietal white matter and cortex. 2. No emergent large vessel occlusion. No significant intracranial stenosis 3. Mild atherosclerotic disease the prior carotid bifurcation bilaterally. Moderate stenosis origin of left vertebral artery which originates from the aortic arch. 80% stenosis at the origin of the right common carotid artery which arises from the aortic arch. 4. Aberrant right subclavian artery. 5. These results were called by telephone at the time of interpretation on 02/14/2018 at 3:16 pm to Dr. Clarene Duke , who verbally acknowledged these results. Electronically Signed   By: Marlan Palau M.D.   On: 02/14/2018 15:17    Procedures Procedures (including critical care time)  Medications Ordered in ED Medications  iopamidol (ISOVUE-370) 76 % injection 100 mL (100 mLs Intravenous Contrast Given 02/14/18 1429)     Initial Impression / Assessment and Plan / ED Course  I have reviewed the triage vital signs and the nursing notes.  Pertinent labs & imaging results that were available during my care of the patient were reviewed by me and considered in my medical decision making (see chart for details).     Patient symptoms are most consistent with a TIA.  Tele-neurology was consulted after initial negative workup.  They recommend CT angiogram to help rule  out clot with collateral flow.  This is negative for acute large vessel occlusion but does show concern for an acute infarct.  MRI was ordered however while she is in the ER this came back for negative for an acute infarct.  Thus her symptoms are most consistent with TIA.  I consulted with Dr. Gerilyn Pilgrim per hospitalist request and no acute neurologic consult is further needed but he would recommend baby aspirin for 1 week in addition to the Eliquis and then go back to just Eliquis.  Can follow-up in his office.  Dr. Adrian Blackwater to admit for TIA workup and supportive care.  Final Clinical Impressions(s) / ED Diagnoses   Final diagnoses:  TIA (transient ischemic attack)    ED Discharge Orders    None       Pricilla Loveless, MD 02/14/18 1920

## 2018-02-14 NOTE — ED Notes (Addendum)
Repaged Dr. Gerilyn Pilgrimoonquah to [phone]

## 2018-02-14 NOTE — Consult Note (Addendum)
[facility] Tele[facility]urology Consult Services  Date of service:  02/14/18   Impression:   Transient exp aphasia today, now resolved.  NIH SS 1 for incorrect answer to month only.  She is outside window for TPA and this is not indicated based on resolved sxs.  But should exclude compensated LVO involving left ICA/MCA.  Known CV risk factors include age, afib on eliquis alread, HTN, HLD.  Recommendations:  STAT CTA head/neck to eval for LVO if available, include CT perfusion ED physician graciously agrees to order  If there is LVO/penumbra, will require transfer to facility with neuro  IR  if no LVO/pneumbra, then the following: permissive HTN encouraged continue eliquis telemetry TTEcho flp, hga1c Neurology consult (I was informed by ED this is not available in person on weekend, hence I left above recs to guide care)  Discussed with ED physician   Please call with further questions and/or any new concerns [phone])    ADDENDUM: CTA head/neck/perfusion:  1. Right posterior MCA ischemia with small area of core infarct in the deep white matter on the right and ischemia in the right parietal white matter and cortex. 2. No emergent large vessel occlusion. No significant intracranial stenosis 3. Mild atherosclerotic disease the prior carotid bifurcation bilaterally. Moderate stenosis origin of left vertebral artery which originates from the aortic arch. 80% stenosis at the origin of the right common carotid artery which arises from the aortic arch. 4. Aberrant right subclavian artery.  PLAN: Given no LVO, no additional acute neuro recs. ______________________________________________________________   History of Present Illness: [age]year old woman with afib on eliquis, with recent TIA manifesting with word finding/slurred speech 2.5 months ago, HTN, HLD, chronic right leg weakness due to distant hip surgery with complete resolution who now presents for "I couldn't  get my words out, my speech was slurred" this morning.  With both instances she denies any associated vision change, focal weakness/numbness.  Her episode lasted < 1minute.  Her last known well reported by nursing home around 9:30.  She denies recently missing her eliquis.  She denies any current complaints presently and nephew confirms she is back to baseline.  This was called in as STAT consult.  Exam:  Vital Signs:   BP 162/85  Neurological exam:  NIH SS 1  1 for incorrect 1 question  spontaneous speech is fluent but a bit hesitant (per nephew this is her baseline so not counted toward NIH SS)   Imaging:  CT head: No acute abnormality. Atrophy and chronic ischemic changes stable from the prior study. No acute abnormality. Atrophy and chronic ischemic changes stable from the prior study.     Medical Data Reviewed:  1.Data reviewed include clinical labs, radiology,  Medical Tests;   2.Tests results discussed with performing or interpreting physician;   3. Reviewed available prior medical records;  4. Obtained case history from another source if applicable/needed;  5. Independent review of image, tracing.   Medical Decision Making:  - Extensive number of diagnosis or management options are considered above.   - Extensive amount of complex data reviewed.   - High risk of complication and/or morbidity or mortality are associated with differential diagnostic considerations above.  - There may be uncertain outcome and increased probability of prolonged functional impairment or high probability of severe prolonged functional impairment associated with some of these differential diagnosis.    Patient was informed the Neurology Consult would happen via telehealth (remote video) and consented to receiving care in this manner.

## 2018-02-14 NOTE — ED Notes (Signed)
Dr. Adrian BlackwaterStinson in room

## 2018-02-14 NOTE — ED Notes (Signed)
Paged Dr. Gerilyn Pilgrimoonquah to [phone]. Per request of MD.

## 2018-02-14 NOTE — ED Notes (Signed)
Teleneurology in progress

## 2018-02-14 NOTE — ED Notes (Signed)
Attempted to call report again, nurse receiving pt stated " I wasn't aware that I was getting a patient. "  Family notified and requesting to speak with MD.

## 2018-02-14 NOTE — ED Notes (Signed)
Offered to turn pt on her side, pt refused at this time.

## 2018-02-14 NOTE — ED Notes (Signed)
Attempted to call report, was told that bed had not been approved yet and would in next few minutes

## 2018-02-15 ENCOUNTER — Other Ambulatory Visit: Payer: Self-pay

## 2018-02-15 ENCOUNTER — Observation Stay (HOSPITAL_COMMUNITY): Payer: Medicare HMO

## 2018-02-15 ENCOUNTER — Observation Stay (HOSPITAL_BASED_OUTPATIENT_CLINIC_OR_DEPARTMENT_OTHER): Payer: Medicare HMO

## 2018-02-15 DIAGNOSIS — I63511 Cerebral infarction due to unspecified occlusion or stenosis of right middle cerebral artery: Secondary | ICD-10-CM | POA: Diagnosis not present

## 2018-02-15 DIAGNOSIS — G459 Transient cerebral ischemic attack, unspecified: Secondary | ICD-10-CM

## 2018-02-15 LAB — HEMOGLOBIN A1C
Hgb A1c MFr Bld: 5.1 % (ref 4.8–5.6)
Mean Plasma Glucose: 99.67 mg/dL

## 2018-02-15 LAB — LIPID PANEL
Cholesterol: 120 mg/dL (ref 0–200)
HDL: 60 mg/dL (ref >40)
LDL CALC: 52 mg/dL (ref 0–99)
TRIGLYCERIDES: 38 mg/dL (ref <150)
Total CHOL/HDL Ratio: 2 RATIO
VLDL: 8 mg/dL (ref 0–40)

## 2018-02-15 LAB — ECHOCARDIOGRAM COMPLETE
Height: 64 in
Weight: 1957.68 oz

## 2018-02-15 MED ORDER — ASPIRIN 81 MG PO TBEC: 81.0000 mg | DELAYED_RELEASE_TABLET | Freq: Every day | ORAL | 0 refills | Status: AC

## 2018-02-15 MED ORDER — AMLODIPINE BESYLATE 10 MG PO TABS: 10.0000 mg | ORAL_TABLET | Freq: Every day | ORAL | 0 refills | Status: AC

## 2018-02-15 NOTE — Progress Notes (Signed)
[facility].  Spoke with Alcario DroughtErica and she said we could just Fax or send discharge summary over with the patient.  Patient discharged with IVs removed and sites intact.

## 2018-02-15 NOTE — Progress Notes (Signed)
*  PRELIMINARY RESULTS* Echocardiogram 2D Echocardiogram has been performed.  Theresa Kennedy, Theresa Kennedy 02/15/2018, 3:17 PM

## 2018-02-15 NOTE — Evaluation (Signed)
Physical Therapy Evaluation Patient Details Name: Theresa Kennedy MRN: [id number] DOB: 09/07/1921 Today's Date: 02/15/2018   History of Present Illness  Theresa Kennedy is a 84 y.o. female with a history of atrial fibrillation, hypertension, history of stroke, GERD.  History obtained by patient and nephew.  Patient had no deficits when the nephew was visiting her this morning around 830.  At approximately 11, [facility] was checking on her and she was having expressive aphasia: The nursing staff had difficulty understanding anything she said.  By the time the patient arrives to the emergency department, her symptoms had completely resolved  Clinical Impression  PT feels she is at baseline  Needs no follow up PT     Follow Up Recommendations No PT follow up    Equipment Recommendations    none   Recommendations for Other Services   none    Precautions / Restrictions Precautions Precautions: None Restrictions Weight Bearing Restrictions: No      Mobility  Bed Mobility Overal bed mobility: Independent                Transfers Overall transfer level: Independent                  Ambulation/Gait Ambulation/Gait assistance: Modified independent (Device/Increase time) Ambulation Distance (Feet): 220 Feet Assistive device: Standard walker Gait Pattern/deviations: WFL(Within Functional Limits)   Gait velocity interpretation: at or above normal speed for age/gender              Pertinent Vitals/Pain Pain Assessment: No/denies pain    Home Living Family/patient expects to be discharged to:: Assisted living               Home Equipment: Walker - 2 wheels      Prior Function Level of Independence: Needs assistance   Gait / Transfers Assistance Needed: with RW  ADL's / Homemaking Assistance Needed: assistive living         Hand Dominance        Extremity/Trunk Assessment   Upper Extremity Assessment Upper Extremity  Assessment: Defer to OT evaluation    Lower Extremity Assessment Lower Extremity Assessment: Overall WFL for tasks assessed       Communication   Communication: No difficulties  Cognition Arousal/Alertness: Awake/alert Behavior During Therapy: WFL for tasks assessed/performed Overall Cognitive Status: Within Functional Limits for tasks assessed                                        General Comments      Exercises     Assessment/Plan    PT Assessment Patent does not need any further PT services  PT Problem List         PT Treatment Interventions      PT Goals (Current goals can be found in the Care Plan section)       Frequency     Barriers to discharge        Co-evaluation                  End of Session Equipment Utilized During Treatment: Gait belt Activity Tolerance: Patient tolerated treatment well Patient left: in chair;with call bell/phone within reach;with chair alarm set Nurse Communication: Mobility status      Time: [id number] PT Time Calculation (min) (ACUTE ONLY): 31 min   Charges:   PT Evaluation $PT Eval Low Complexity:  1 Low     PT G CodesVirgina Organ, PT CLT [phone] 02/15/2018, 11:16 AM

## 2018-02-15 NOTE — Discharge Summary (Signed)
Discharge Summary  Theresa Kennedy VHQ:[id number] DOB: [birth year]  PCP: Joette Catching, MD  Admit date: 02/14/2018 Discharge date: 02/15/2018  Time spent: <54mins  Recommendations for Outpatient Follow-up:  1. F/u with PMD within a week  for hospital discharge follow up, repeat cbc/bmp at follow up. 2. F/u with neurology for TIA 3. Return to ALF 4. New meds asa 81mg  for one week, hold norvasc, resume norvasc in one week if sbp start to trend up.  Discharge Diagnoses:  Active Hospital Problems   Diagnosis Date Noted  . Acute right MCA stroke (HCC) 02/14/2018  . TIA (transient ischemic attack) 02/14/2018  . Essential hypertension 07/03/2017  . GERD (gastroesophageal reflux disease) 02/12/2015  . Atrial fibrillation, persistent (HCC) 04/21/2013    Resolved Hospital Problems  No resolved problems to display.    Discharge Condition: stable  Diet recommendation: heart healthy  Filed Weights   02/14/18 1219 02/14/18 2100  Weight: 51.7 kg (114 lb) 55.5 kg (122 lb 5.7 oz)    History of present illness: (per admitting MD Dr Adrian Blackwater) PCP: Joette Catching, MD  Outpatient Specialists:   Patient Coming From: [facility]  Chief Complaint: Expressive aphasia  HPI: Theresa Kennedy is a 84 y.o. female with a history of atrial fibrillation, hypertension, history of stroke, GERD.  History obtained by patient and nephew.  Patient had no deficits when the nephew was visiting her this morning around 830.  At approximately 11, the staff at [facility] was checking on her and she was having expressive aphasia: The nursing staff had difficulty understanding anything she said.  By the time the patient arrives to the emergency department, her symptoms had completely resolved.  She denies headache, focal weakness, blurred vision, vision loss.  Emergency Department Course: Neuro telemetry was consulted.  CT and CTA of head and neck showed small right posterior MCA ischemic stroke.   Patient had an MRI which showed no acute stroke    Hospital Course:  Principal Problem:   Acute right MCA stroke (HCC) Active Problems:   Atrial fibrillation, persistent (HCC)   GERD (gastroesophageal reflux disease)   Essential hypertension   TIA (transient ischemic attack)  Expressive aphasia: -Transient, resolved by the time patient was sent to the ED, likely TIA -Mri brain: "No acute finding.  Negative for acute infarct. Remote right MCA branch infarct affecting the frontal lobe. Moderate atrophy and chronic small vessel ischemia." -Echocardiogram lvef 60-65%, no embolic source  -Tele neuro consulted, recommended asa for one week, continue eliquis,statin.  ldl 52, a1c 5.1 -she is continued on lopressor, norvasc held in the hospital, resume norvasc in one week if sbp start to trend up. -Outpatient neurology follow up.  Chronic atrial fibrillation Rate controlled night metoprolol, continue Eliquis  Hypertension Continue Lopressor, systolic blood pressures in the 120s. PMD consider restart Norvasc in 1 week if systolic blood pressure start to trend up  Code status: DNR  Procedures:  none  Consultations:  Tele neurology  Discharge Exam: BP (!) 126/54 (BP Location: Right Arm)   Pulse 69   Temp 98.1 F (36.7 C) (Oral)   Resp 15   Ht 5\' 4"  (1.626 m)   Wt 55.5 kg (122 lb 5.7 oz)   SpO2 100%   BMI 21.00 kg/m   General: NAD, appear younger than stated age, speech is clear, no focal weakness, she is not oriented to the year which is at baseline. Cardiovascular: IRRR Respiratory: CATBL  Discharge Instructions You were cared for by a hospitalist during  your hospital stay. If you have any questions about your discharge medications or the care you received while you were in the hospital after you are discharged, you can call the unit and asked to speak with the hospitalist on call if the hospitalist that took care of you is not available. Once you are discharged, your  primary care physician will handle any further medical issues. Please note that NO REFILLS for any discharge medications will be authorized once you are discharged, as it is imperative that you return to your primary care physician (or establish a relationship with a primary care physician if you do not have one) for your aftercare needs so that they can reassess your need for medications and monitor your lab values.  Discharge Instructions    Diet - low sodium heart healthy   Complete by:  As directed    Increase activity slowly   Complete by:  As directed      Allergies as of 02/15/2018      Reactions   Heparin    HIT positive   Sulfa Antibiotics Other (See Comments)   unknown      Medication List    STOP taking these medications   HYDROcodone-acetaminophen 5-325 MG tablet Commonly known as:  NORCO/VICODIN     TAKE these medications   acetaminophen 325 MG tablet Commonly known as:  TYLENOL Take 2 tablets (650 mg total) by mouth every 6 (six) hours as needed for mild pain (or Fever >/= 101).   amLODipine 10 MG tablet Commonly known as:  NORVASC Take 1 tablet (10 mg total) by mouth daily. Hold norvasc for one week, resume norvasc gradually if sbp start to trend up Start taking on:  02/24/2018 What changed:    additional instructions  These instructions start on 02/24/2018. If you are unsure what to do until then, ask your doctor or other care provider.   apixaban 2.5 MG Tabs tablet Commonly known as:  ELIQUIS Take 1 tablet (2.5 mg total) by mouth 2 (two) times daily.   aspirin 81 MG EC tablet Take 1 tablet (81 mg total) by mouth daily for 7 days. Start taking on:  02/16/2018   atorvastatin 10 MG tablet Commonly known as:  LIPITOR Take 10 mg by mouth daily at 6 PM.   Calcium Carbonate-Vitamin D 600-200 MG-UNIT Tabs Take 1 tablet by mouth daily.   cholecalciferol 1000 units tablet Commonly known as:  VITAMIN D Take 1,000 Units by mouth daily.   diphenhydrAMINE 25 MG  tablet Commonly known as:  BENADRYL Take 25 mg by mouth every 6 (six) hours as needed for itching.   diphenhydrAMINE-zinc acetate cream Commonly known as:  BENADRYL Apply 1 application topically 2 (two) times daily as needed for itching.   docusate sodium 100 MG capsule Commonly known as:  COLACE Take 1 capsule (100 mg total) by mouth daily.   Fish Oil 1000 MG Caps Take 1 capsule by mouth 2 (two) times daily.   metoprolol tartrate 25 MG tablet Commonly known as:  LOPRESSOR Take 25 mg by mouth 2 (two) times daily.   multivitamin with minerals Tabs tablet Take 1 tablet by mouth daily.   polyethylene glycol packet Commonly known as:  MIRALAX / GLYCOLAX Take 17 g by mouth daily as needed for mild constipation.   psyllium 58.6 % packet Commonly known as:  METAMUCIL Take 1 packet by mouth daily.   ranitidine 150 MG capsule Commonly known as:  ZANTAC Take 150 mg by mouth 2 (  two) times daily.   sodium chloride 5 % ophthalmic ointment Commonly known as:  MURO 128 Place 1 application into both eyes at bedtime.   SYSTANE 0.4-0.3 % Soln Generic drug:  Polyethyl Glycol-Propyl Glycol Apply 1 drop to eye every 8 (eight) hours as needed (dry eyes).      Allergies  Allergen Reactions  . Heparin     HIT positive  . Sulfa Antibiotics Other (See Comments)    unknown   Follow-up Information    Joette Catching, MD Follow up in 1 week(s).   Specialty:  Family Medicine Why:  hospital discharge follow up. pmd to follow up on final echocardiogram result, result pending at time of discharge. Contact information: [address] [place] [address] [phone]        Micki Riley, MD Follow up.   Specialties:  Neurology, Radiology Why:  TIA Contact information: [address] [place] [address] [phone]            The results of significant diagnostics from this hospitalization (including imaging, microbiology, ancillary and laboratory) are  listed below for reference.    Significant Diagnostic Studies: Ct Angio Head W Or Wo Contrast  Result Date: 02/14/2018 CLINICAL DATA:  TIA EXAM: CT ANGIOGRAPHY HEAD AND NECK CT PERFUSION BRAIN TECHNIQUE: Multidetector CT imaging of the head and neck was performed using the standard protocol during bolus administration of intravenous contrast. Multiplanar CT image reconstructions and MIPs were obtained to evaluate the vascular anatomy. Carotid stenosis measurements (when applicable) are obtained utilizing NASCET criteria, using the distal internal carotid diameter as the denominator. Multiphase CT imaging of the brain was performed following IV bolus contrast injection. Subsequent parametric perfusion maps were calculated using RAPID software. CONTRAST:  ISOVUE-370 IOPAMIDOL (ISOVUE-370) INJECTION 76% COMPARISON:  CT head 02/14/2018 FINDINGS: CTA NECK FINDINGS Aortic arch: Left aortic arch. Aberrant right subclavian artery with retroesophageal course. Left vertebral artery origin from the aortic arch. Mild atherosclerotic disease in the aortic arch. Right carotid system: Stenosis at the origin of the right common carotid artery at the arch. This is best seen on the sagittal projection and measures 80% diameter stenosis. Atherosclerotic calcification at the right carotid bifurcation. Less than 25% diameter stenosis at the origin of the right internal carotid artery. Moderate stenosis origin of right external carotid artery. Left carotid system: Left common carotid artery widely patent with mild atherosclerotic disease. Atherosclerotic calcification left carotid bifurcation. Less than 25% diameter stenosis left internal carotid artery Vertebral arteries: Right vertebral artery patent to the basilar without stenosis Left vertebral artery origin from the arch. Moderate stenosis due to calcified plaque proximal left vertebral artery. Remainder left vertebral artery is patent to the basilar without stenosis.  Skeleton: Moderate to advanced cervical spondylosis with spinal and foraminal stenosis. No acute abnormality Other neck: Negative for mass or adenopathy. Multiple small thyroid nodules bilaterally. Upper chest: Extensive apical scarring bilaterally with COPD. Review of the MIP images confirms the above findings CTA HEAD FINDINGS Anterior circulation: Mild atherosclerotic disease in the cavernous carotid bilaterally without stenosis. Anterior and middle cerebral arteries patent bilaterally without stenosis or occlusion. Posterior circulation: Both vertebral arteries widely patent the basilar. PICA patent bilaterally. AICA, superior cerebellar, and posterior cerebral arteries patent bilaterally. Basilar widely patent. Posterior communicating artery patent bilaterally. Venous sinuses: Patent Anatomic variants: None Delayed phase: Normal enhancement on delayed imaging. Review of the MIP images confirms the above findings CT Brain Perfusion Findings: CBF (<30%) Volume: 6mL Perfusion (Tmax>6.0s) volume: 30mL Mismatch  Volume: 24mL Infarction Location:Right posterior MCA territory involving the deep white matter and right parietal lobe. IMPRESSION: 1. Right posterior MCA ischemia with small area of core infarct in the deep white matter on the right and ischemia in the right parietal white matter and cortex. 2. No emergent large vessel occlusion. No significant intracranial stenosis 3. Mild atherosclerotic disease the prior carotid bifurcation bilaterally. Moderate stenosis origin of left vertebral artery which originates from the aortic arch. 80% stenosis at the origin of the right common carotid artery which arises from the aortic arch. 4. Aberrant right subclavian artery. 5. These results were called by telephone at the time of interpretation on 02/14/2018 at 3:16 pm to Dr. Clarene Duke , who verbally acknowledged these results. Electronically Signed   By: Marlan Palau M.D.   On: 02/14/2018 15:17   Ct Head Wo  Contrast  Result Date: 02/14/2018 CLINICAL DATA:  Subacute neuro deficits.  Expressive aphasia EXAM: CT HEAD WITHOUT CONTRAST TECHNIQUE: Contiguous axial images were obtained from the base of the skull through the vertex without intravenous contrast. COMPARISON:  MRI head 09/30/2017 FINDINGS: Brain: Mild to moderate atrophy. Chronic microvascular ischemic change in the white matter. Chronic infarct right frontal lobe. Negative for acute infarct. Negative for hemorrhage or mass. No midline shift. Vascular: Negative for hyperdense vessel Skull: Negative Sinuses/Orbits: Negative.  Bilateral cataract removal Other: None IMPRESSION: No acute abnormality. Atrophy and chronic ischemic changes stable from the prior study. Electronically Signed   By: Marlan Palau M.D.   On: 02/14/2018 13:00   Ct Angio Neck W Or Wo Contrast  Result Date: 02/14/2018 CLINICAL DATA:  TIA EXAM: CT ANGIOGRAPHY HEAD AND NECK CT PERFUSION BRAIN TECHNIQUE: Multidetector CT imaging of the head and neck was performed using the standard protocol during bolus administration of intravenous contrast. Multiplanar CT image reconstructions and MIPs were obtained to evaluate the vascular anatomy. Carotid stenosis measurements (when applicable) are obtained utilizing NASCET criteria, using the distal internal carotid diameter as the denominator. Multiphase CT imaging of the brain was performed following IV bolus contrast injection. Subsequent parametric perfusion maps were calculated using RAPID software. CONTRAST:  ISOVUE-370 IOPAMIDOL (ISOVUE-370) INJECTION 76% COMPARISON:  CT head 02/14/2018 FINDINGS: CTA NECK FINDINGS Aortic arch: Left aortic arch. Aberrant right subclavian artery with retroesophageal course. Left vertebral artery origin from the aortic arch. Mild atherosclerotic disease in the aortic arch. Right carotid system: Stenosis at the origin of the right common carotid artery at the arch. This is best seen on the sagittal projection  and measures 80% diameter stenosis. Atherosclerotic calcification at the right carotid bifurcation. Less than 25% diameter stenosis at the origin of the right internal carotid artery. Moderate stenosis origin of right external carotid artery. Left carotid system: Left common carotid artery widely patent with mild atherosclerotic disease. Atherosclerotic calcification left carotid bifurcation. Less than 25% diameter stenosis left internal carotid artery Vertebral arteries: Right vertebral artery patent to the basilar without stenosis Left vertebral artery origin from the arch. Moderate stenosis due to calcified plaque proximal left vertebral artery. Remainder left vertebral artery is patent to the basilar without stenosis. Skeleton: Moderate to advanced cervical spondylosis with spinal and foraminal stenosis. No acute abnormality Other neck: Negative for mass or adenopathy. Multiple small thyroid nodules bilaterally. Upper chest: Extensive apical scarring bilaterally with COPD. Review of the MIP images confirms the above findings CTA HEAD FINDINGS Anterior circulation: Mild atherosclerotic disease in the cavernous carotid bilaterally without stenosis. Anterior and middle cerebral arteries patent bilaterally without stenosis or  occlusion. Posterior circulation: Both vertebral arteries widely patent the basilar. PICA patent bilaterally. AICA, superior cerebellar, and posterior cerebral arteries patent bilaterally. Basilar widely patent. Posterior communicating artery patent bilaterally. Venous sinuses: Patent Anatomic variants: None Delayed phase: Normal enhancement on delayed imaging. Review of the MIP images confirms the above findings CT Brain Perfusion Findings: CBF (<30%) Volume: 6mL Perfusion (Tmax>6.0s) volume: 30mL Mismatch Volume: 24mL Infarction Location:Right posterior MCA territory involving the deep white matter and right parietal lobe. IMPRESSION: 1. Right posterior MCA ischemia with small area of core  infarct in the deep white matter on the right and ischemia in the right parietal white matter and cortex. 2. No emergent large vessel occlusion. No significant intracranial stenosis 3. Mild atherosclerotic disease the prior carotid bifurcation bilaterally. Moderate stenosis origin of left vertebral artery which originates from the aortic arch. 80% stenosis at the origin of the right common carotid artery which arises from the aortic arch. 4. Aberrant right subclavian artery. 5. These results were called by telephone at the time of interpretation on 02/14/2018 at 3:16 pm to Dr. Clarene Duke , who verbally acknowledged these results. Electronically Signed   By: Marlan Palau M.D.   On: 02/14/2018 15:17   Mr Brain Wo Contrast  Result Date: 02/14/2018 CLINICAL DATA:  Dizziness.  Episode of transient aphasia today EXAM: MRI HEAD WITHOUT CONTRAST TECHNIQUE: Multiplanar, multiecho pulse sequences of the brain and surrounding structures were obtained without intravenous contrast. COMPARISON:  Head CT and CTA from earlier today FINDINGS: Brain: Remote right MCA branch infarct affecting the high right frontal lobe, cortically based. There is moderate atrophy and chronic periventricular small vessel ischemic gliosis. No hemorrhage, hydrocephalus, or masslike finding. No generalized chronic blood products. Vascular: Major flow voids are preserved.  There was preceding CTA. Skull and upper cervical spine: No evidence of marrow lesion Sinuses/Orbits: Bilateral cataract resection.  No acute finding. IMPRESSION: 1. No acute finding.  Negative for acute infarct. 2. Remote right MCA branch infarct affecting the frontal lobe. 3. Moderate atrophy and chronic small vessel ischemia. Electronically Signed   By: Marnee Spring M.D.   On: 02/14/2018 16:29   US Carotid Bilateral (at [facility] And [facility] Only)  Result Date: 02/15/2018 CLINICAL DATA:  Stroke symptoms, asymptomatic carotid bruit EXAM: BILATERAL CAROTID DUPLEX ULTRASOUND TECHNIQUE:  Wallace Cullens scale imaging, color Doppler and duplex ultrasound were performed of bilateral carotid and vertebral arteries in the neck. COMPARISON:  None. FINDINGS: Criteria: Quantification of carotid stenosis is based on velocity parameters that correlate the residual internal carotid diameter with NASCET-based stenosis levels, using the diameter of the distal internal carotid lumen as the denominator for stenosis measurement. The following velocity measurements were obtained: RIGHT ICA:  103/12 cm/sec CCA:  84/8 cm/sec SYSTOLIC ICA/CCA RATIO:  1.2 DIASTOLIC ICA/CCA RATIO:  1.6 ECA:  191 cm/sec LEFT ICA:  89/13 cm/sec CCA:  123/9 cm/sec SYSTOLIC ICA/CCA RATIO:  0.7 DIASTOLIC ICA/CCA RATIO:  1.5 ECA:  87 cm/sec RIGHT CAROTID ARTERY: Moderate carotid intimal thickening and echogenic shadowing plaque formation. No hemodynamically significant right ICA stenosis, velocity elevation, or turbulent flow. Degree of narrowing less than 50%. RIGHT VERTEBRAL ARTERY:  Antegrade LEFT CAROTID ARTERY: Similar moderate carotid intimal thickening and echogenic plaque formation. No hemodynamically significant left ICA stenosis, velocity elevation, or turbulent flow. LEFT VERTEBRAL ARTERY:  Antegrade IMPRESSION: Moderate carotid atherosclerosis. No hemodynamically significant ICA stenosis. Degree of narrowing less than 50% bilaterally by ultrasound criteria. Patent antegrade vertebral flow bilaterally. Electronically Signed   By: Judie Petit.  Shick M.D.  On: 02/15/2018 10:02   Ct Cerebral Perfusion W Contrast  Result Date: 02/14/2018 CLINICAL DATA:  TIA EXAM: CT ANGIOGRAPHY HEAD AND NECK CT PERFUSION BRAIN TECHNIQUE: Multidetector CT imaging of the head and neck was performed using the standard protocol during bolus administration of intravenous contrast. Multiplanar CT image reconstructions and MIPs were obtained to evaluate the vascular anatomy. Carotid stenosis measurements (when applicable) are obtained utilizing NASCET criteria, using the  distal internal carotid diameter as the denominator. Multiphase CT imaging of the brain was performed following IV bolus contrast injection. Subsequent parametric perfusion maps were calculated using RAPID software. CONTRAST:  ISOVUE-370 IOPAMIDOL (ISOVUE-370) INJECTION 76% COMPARISON:  CT head 02/14/2018 FINDINGS: CTA NECK FINDINGS Aortic arch: Left aortic arch. Aberrant right subclavian artery with retroesophageal course. Left vertebral artery origin from the aortic arch. Mild atherosclerotic disease in the aortic arch. Right carotid system: Stenosis at the origin of the right common carotid artery at the arch. This is best seen on the sagittal projection and measures 80% diameter stenosis. Atherosclerotic calcification at the right carotid bifurcation. Less than 25% diameter stenosis at the origin of the right internal carotid artery. Moderate stenosis origin of right external carotid artery. Left carotid system: Left common carotid artery widely patent with mild atherosclerotic disease. Atherosclerotic calcification left carotid bifurcation. Less than 25% diameter stenosis left internal carotid artery Vertebral arteries: Right vertebral artery patent to the basilar without stenosis Left vertebral artery origin from the arch. Moderate stenosis due to calcified plaque proximal left vertebral artery. Remainder left vertebral artery is patent to the basilar without stenosis. Skeleton: Moderate to advanced cervical spondylosis with spinal and foraminal stenosis. No acute abnormality Other neck: Negative for mass or adenopathy. Multiple small thyroid nodules bilaterally. Upper chest: Extensive apical scarring bilaterally with COPD. Review of the MIP images confirms the above findings CTA HEAD FINDINGS Anterior circulation: Mild atherosclerotic disease in the cavernous carotid bilaterally without stenosis. Anterior and middle cerebral arteries patent bilaterally without stenosis or occlusion. Posterior  circulation: Both vertebral arteries widely patent the basilar. PICA patent bilaterally. AICA, superior cerebellar, and posterior cerebral arteries patent bilaterally. Basilar widely patent. Posterior communicating artery patent bilaterally. Venous sinuses: Patent Anatomic variants: None Delayed phase: Normal enhancement on delayed imaging. Review of the MIP images confirms the above findings CT Brain Perfusion Findings: CBF (<30%) Volume: 6mL Perfusion (Tmax>6.0s) volume: 30mL Mismatch Volume: 24mL Infarction Location:Right posterior MCA territory involving the deep white matter and right parietal lobe. IMPRESSION: 1. Right posterior MCA ischemia with small area of core infarct in the deep white matter on the right and ischemia in the right parietal white matter and cortex. 2. No emergent large vessel occlusion. No significant intracranial stenosis 3. Mild atherosclerotic disease the prior carotid bifurcation bilaterally. Moderate stenosis origin of left vertebral artery which originates from the aortic arch. 80% stenosis at the origin of the right common carotid artery which arises from the aortic arch. 4. Aberrant right subclavian artery. 5. These results were called by telephone at the time of interpretation on 02/14/2018 at 3:16 pm to Dr. Clarene Duke , who verbally acknowledged these results. Electronically Signed   By: Marlan Palau M.D.   On: 02/14/2018 15:17    Microbiology: No results found for this or any previous visit (from the past 240 hour(s)).   Labs: Basic Metabolic Panel: Recent Labs  Lab 02/14/18 1225 02/14/18 1229  NA 134* 136  K 3.8 3.7  CL 94* 95*  CO2 27  --   GLUCOSE 117* 114*  BUN 23* 22*  CREATININE 0.79 0.80  CALCIUM 9.4  --    Liver Function Tests: Recent Labs  Lab 02/14/18 1225  AST 30  ALT 17  ALKPHOS 48  BILITOT 0.8  PROT 6.5  ALBUMIN 3.9   No results for input(s): LIPASE, AMYLASE in the last 168 hours. No results for input(s): AMMONIA in the last 168  hours. CBC: Recent Labs  Lab 02/14/18 1225 02/14/18 1229  WBC 7.0  --   NEUTROABS 4.7  --   HGB 14.2 15.6*  HCT 43.1 46.0  MCV 95.6  --   PLT 163  --    Cardiac Enzymes: Recent Labs  Lab 02/14/18 1225  TROPONINI <0.03   BNP: BNP (last 3 results) No results for input(s): BNP in the last 8760 hours.  ProBNP (last 3 results) No results for input(s): PROBNP in the last 8760 hours.  CBG: No results for input(s): GLUCAP in the last 168 hours.     Signed:  Albertine GratesFang Demica Zook MD, PhD  [facility] 02/15/2018, 5:16 PM

## 2018-12-27 IMAGING — US US CAROTID DUPLEX BILAT
1 series · 13 of 24 positions shown · non-contrast
Comparison: None.

CLINICAL DATA: Stroke symptoms, asymptomatic carotid bruit

EXAM:
BILATERAL CAROTID DUPLEX ULTRASOUND
TECHNIQUE: Gray scale imaging, color Doppler and duplex ultrasound were
performed of bilateral carotid and vertebral arteries in the neck.

[Series 1: us carotid duplex bilat · 0.06mm/px · 13 of 72 slices shown]
[im 1/72]
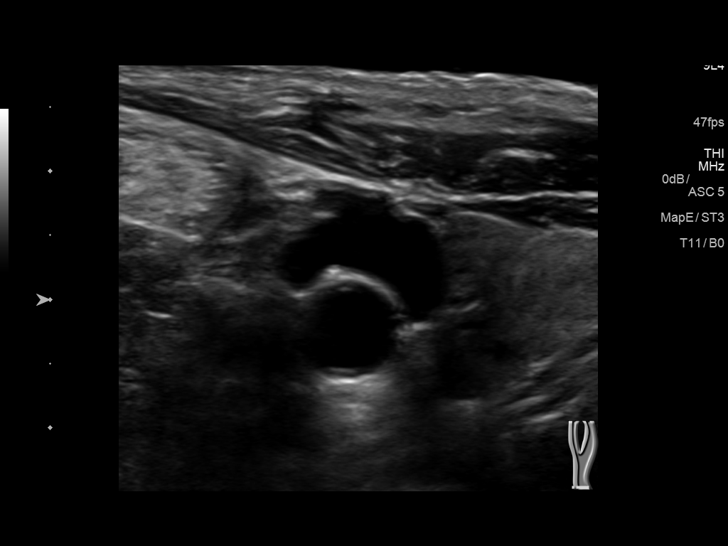
[im 7/72]
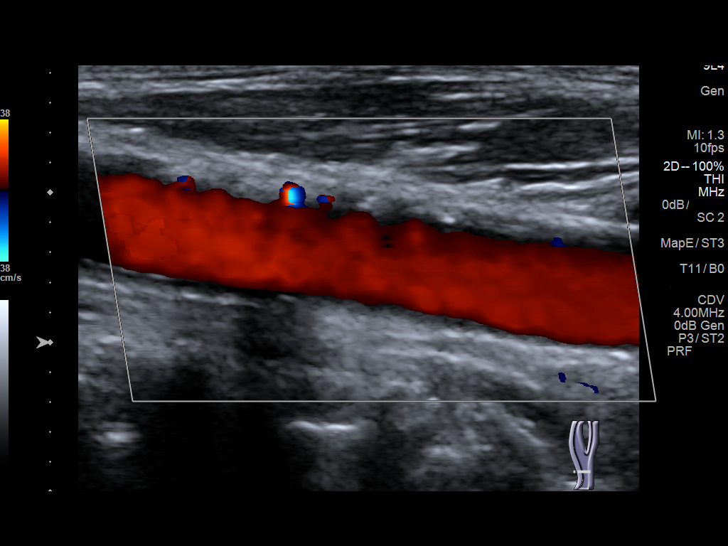
[im 13/72]
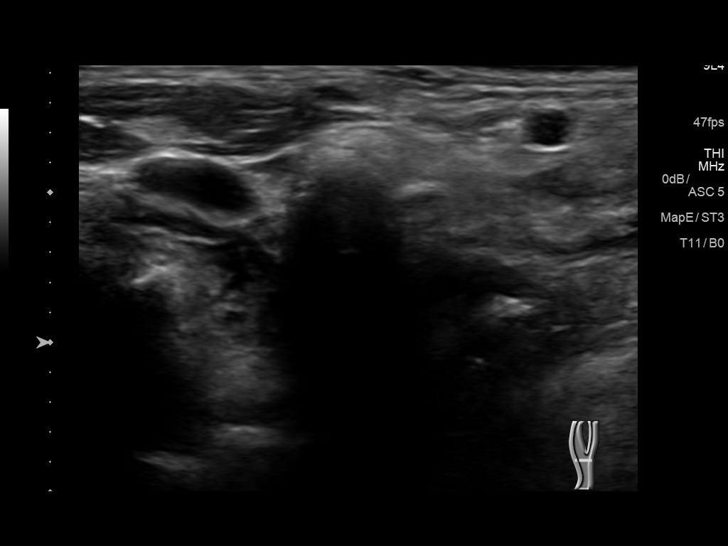
[im 19/72]
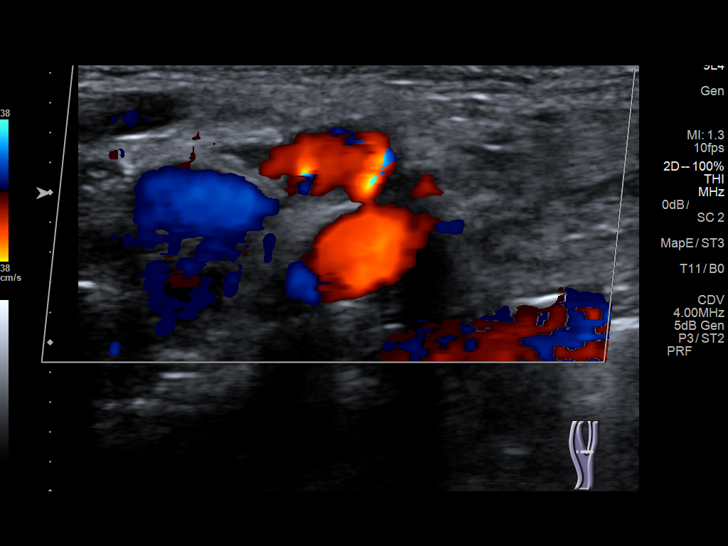
[im 25/72]
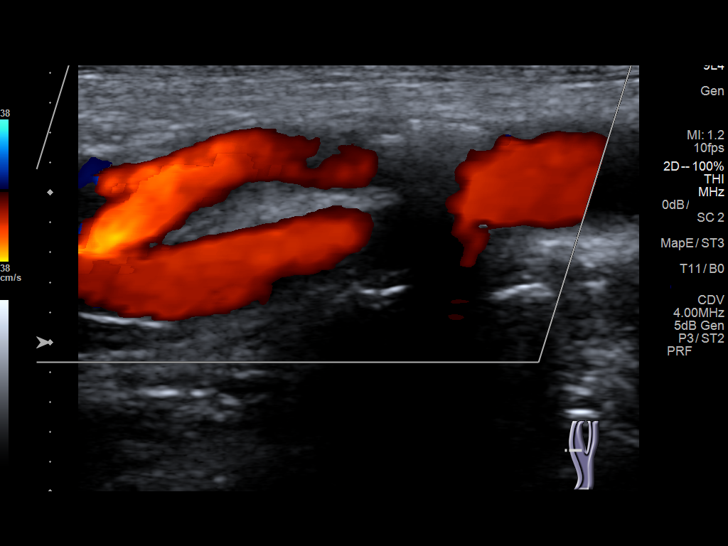
[im 31/72]
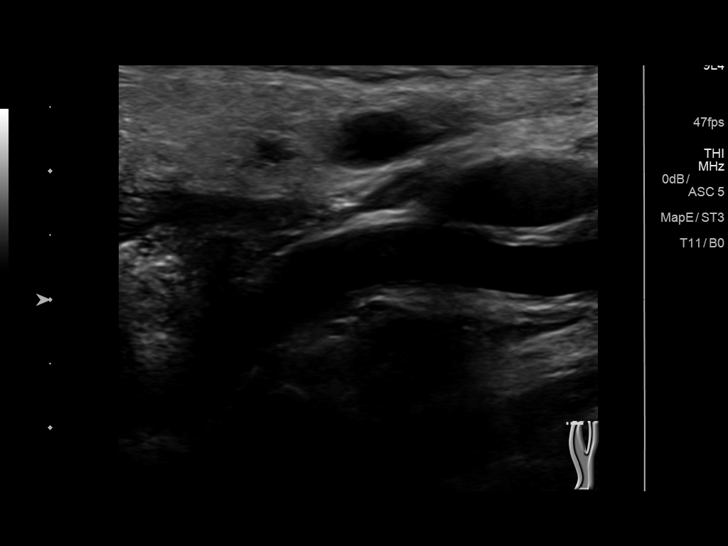
[im 38/72]
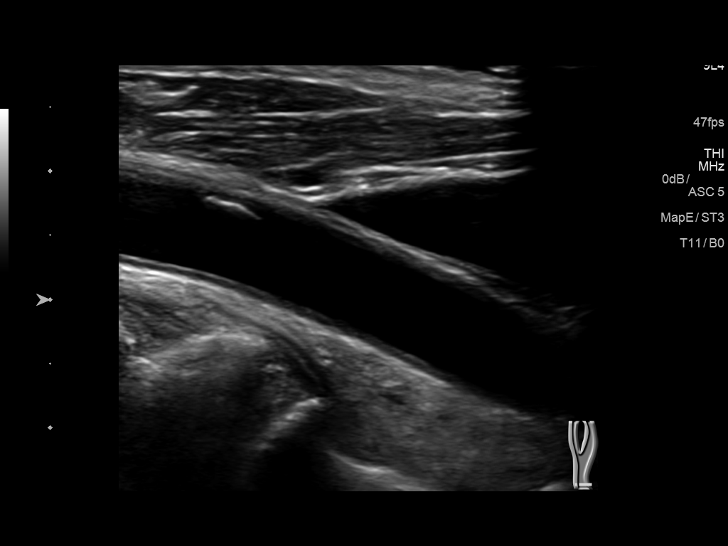
[im 41/72]
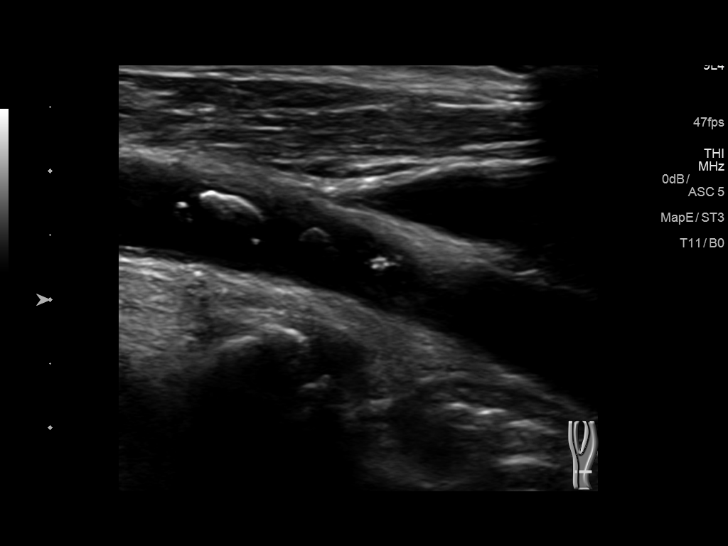
[im 47/72]
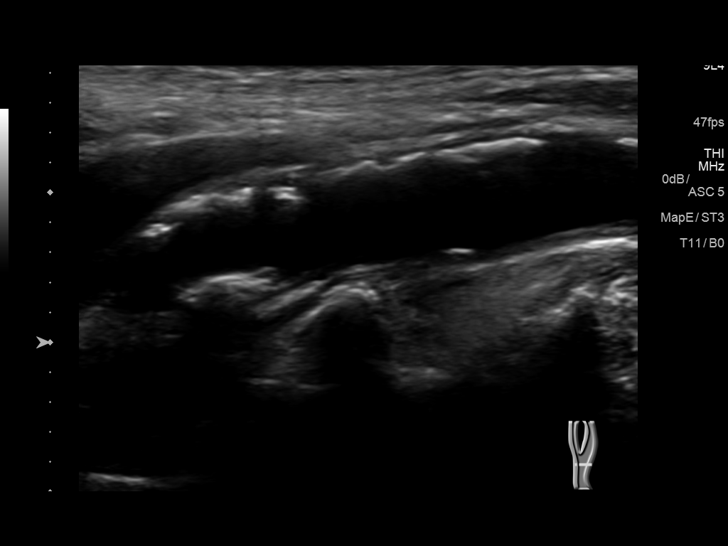
[im 53/72]
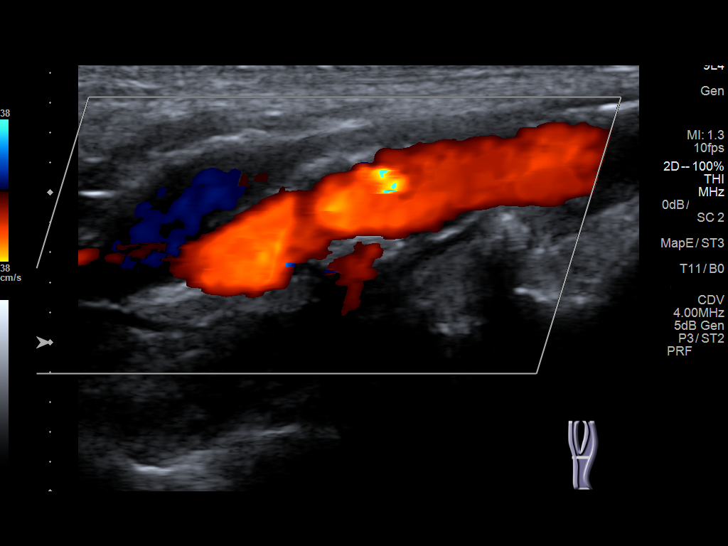
[im 59/72]
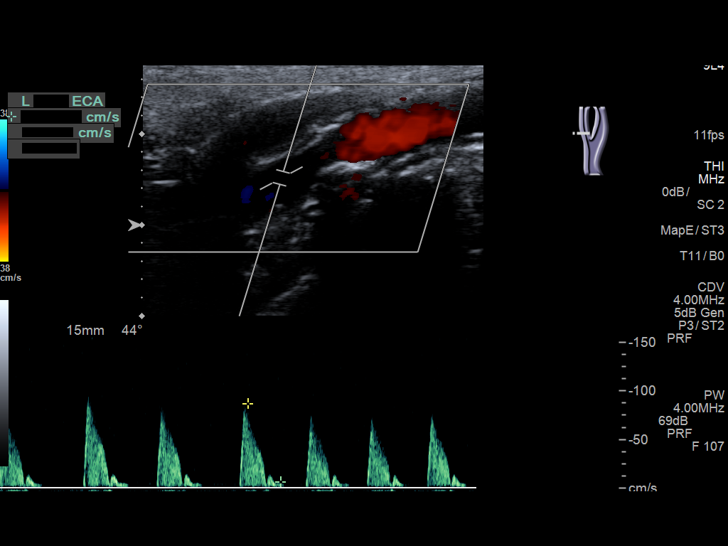
[im 65/72]
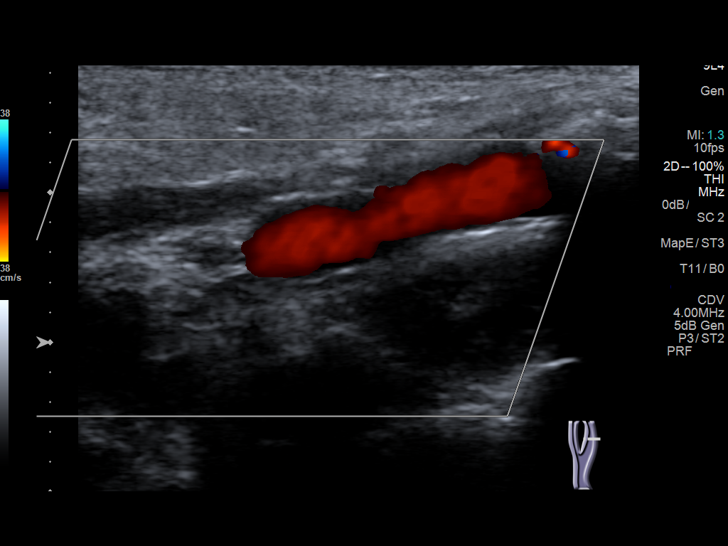
[im 72/72]
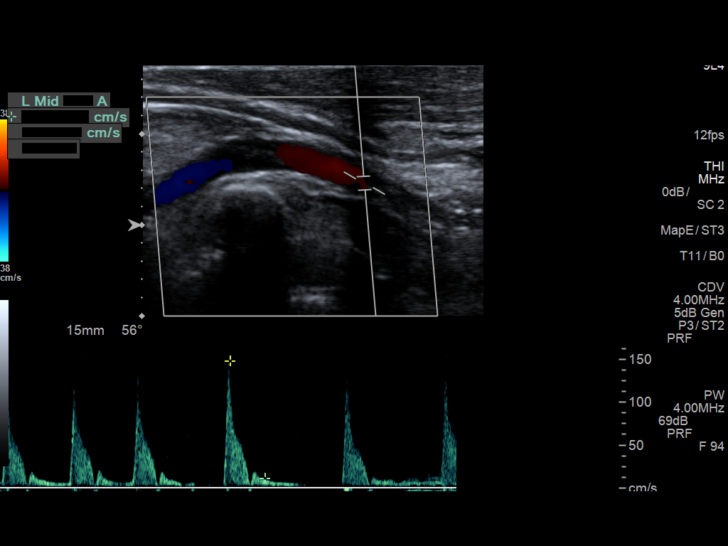

[13 of 24 positions shown; findings below may reference images not displayed]

FINDINGS: Criteria: Quantification of carotid stenosis is based on velocity
parameters that correlate the residual internal carotid diameter
with NASCET-based stenosis levels, using the diameter of the distal
internal carotid lumen as the denominator for stenosis measurement.

The following velocity measurements were obtained:

RIGHT

ICA:  103/12 cm/sec

CCA:  84/8 cm/sec

SYSTOLIC ICA/CCA RATIO:

DIASTOLIC ICA/CCA RATIO:

ECA:  191 cm/sec

LEFT

ICA:  89/13 cm/sec

CCA:  123/9 cm/sec

SYSTOLIC ICA/CCA RATIO:

DIASTOLIC ICA/CCA RATIO:

ECA:  87 cm/sec

RIGHT CAROTID ARTERY: Moderate carotid intimal thickening and
echogenic shadowing plaque formation. No hemodynamically significant
right ICA stenosis, velocity elevation, or turbulent flow. Degree of
narrowing less than 50%.

RIGHT VERTEBRAL ARTERY:  Antegrade

LEFT CAROTID ARTERY: Similar moderate carotid intimal thickening and
echogenic plaque formation. No hemodynamically significant left ICA
stenosis, velocity elevation, or turbulent flow.

LEFT VERTEBRAL ARTERY:  Antegrade
IMPRESSION: Moderate carotid atherosclerosis. No hemodynamically significant ICA
stenosis. Degree of narrowing less than 50% bilaterally by
ultrasound criteria.

Patent antegrade vertebral flow bilaterally.

## 2019-01-18 DEATH — deceased
